# Patient Record
Sex: Male | Born: 1940 | Race: White | Hispanic: No | Marital: Married | Smoking: Former smoker
Health system: Southern US, Community
[De-identification: ages and names within clinical notes are randomized; demographics above are authoritative.]

## PROBLEM LIST (undated history)

## (undated) DIAGNOSIS — K5792 Diverticulitis of intestine, part unspecified, without perforation or abscess without bleeding: Secondary | ICD-10-CM

## (undated) DIAGNOSIS — I6529 Occlusion and stenosis of unspecified carotid artery: Secondary | ICD-10-CM

## (undated) DIAGNOSIS — R0600 Dyspnea, unspecified: Secondary | ICD-10-CM

## (undated) DIAGNOSIS — J189 Pneumonia, unspecified organism: Secondary | ICD-10-CM

## (undated) DIAGNOSIS — J449 Chronic obstructive pulmonary disease, unspecified: Secondary | ICD-10-CM

## (undated) DIAGNOSIS — I739 Peripheral vascular disease, unspecified: Secondary | ICD-10-CM

## (undated) DIAGNOSIS — J40 Bronchitis, not specified as acute or chronic: Secondary | ICD-10-CM

## (undated) DIAGNOSIS — K649 Unspecified hemorrhoids: Secondary | ICD-10-CM

## (undated) DIAGNOSIS — I2510 Atherosclerotic heart disease of native coronary artery without angina pectoris: Secondary | ICD-10-CM

## (undated) DIAGNOSIS — K429 Umbilical hernia without obstruction or gangrene: Secondary | ICD-10-CM

## (undated) DIAGNOSIS — M1990 Unspecified osteoarthritis, unspecified site: Secondary | ICD-10-CM

## (undated) DIAGNOSIS — R000 Tachycardia, unspecified: Secondary | ICD-10-CM

## (undated) DIAGNOSIS — R778 Other specified abnormalities of plasma proteins: Secondary | ICD-10-CM

## (undated) DIAGNOSIS — E538 Deficiency of other specified B group vitamins: Secondary | ICD-10-CM

## (undated) DIAGNOSIS — K922 Gastrointestinal hemorrhage, unspecified: Secondary | ICD-10-CM

## (undated) DIAGNOSIS — N179 Acute kidney failure, unspecified: Secondary | ICD-10-CM

## (undated) DIAGNOSIS — R7989 Other specified abnormal findings of blood chemistry: Secondary | ICD-10-CM

## (undated) DIAGNOSIS — I208 Other forms of angina pectoris: Secondary | ICD-10-CM

## (undated) DIAGNOSIS — C801 Malignant (primary) neoplasm, unspecified: Secondary | ICD-10-CM

## (undated) DIAGNOSIS — A419 Sepsis, unspecified organism: Secondary | ICD-10-CM

## (undated) DIAGNOSIS — N183 Chronic kidney disease, stage 3 unspecified: Secondary | ICD-10-CM

## (undated) DIAGNOSIS — I82442 Acute embolism and thrombosis of left tibial vein: Secondary | ICD-10-CM

## (undated) DIAGNOSIS — I10 Essential (primary) hypertension: Secondary | ICD-10-CM

## (undated) DIAGNOSIS — I471 Supraventricular tachycardia: Secondary | ICD-10-CM

## (undated) DIAGNOSIS — E785 Hyperlipidemia, unspecified: Secondary | ICD-10-CM

## (undated) HISTORY — PX: EYE SURGERY: SHX253

---

## 2009-03-14 ENCOUNTER — Ambulatory Visit: Payer: Self-pay

## 2009-03-26 ENCOUNTER — Ambulatory Visit: Payer: Self-pay | Admitting: Ophthalmology

## 2009-07-30 ENCOUNTER — Ambulatory Visit: Payer: Self-pay | Admitting: Ophthalmology

## 2011-02-26 ENCOUNTER — Ambulatory Visit: Payer: Self-pay | Admitting: Family Medicine

## 2011-06-12 ENCOUNTER — Ambulatory Visit: Payer: Self-pay | Admitting: Unknown Physician Specialty

## 2011-06-16 LAB — PATHOLOGY REPORT

## 2013-12-22 HISTORY — PX: COLONOSCOPY: SHX174

## 2014-12-22 DIAGNOSIS — J189 Pneumonia, unspecified organism: Secondary | ICD-10-CM

## 2014-12-22 HISTORY — DX: Pneumonia, unspecified organism: J18.9

## 2016-02-28 DIAGNOSIS — Z125 Encounter for screening for malignant neoplasm of prostate: Secondary | ICD-10-CM | POA: Diagnosis not present

## 2016-02-28 DIAGNOSIS — E538 Deficiency of other specified B group vitamins: Secondary | ICD-10-CM | POA: Diagnosis not present

## 2016-02-28 DIAGNOSIS — J40 Bronchitis, not specified as acute or chronic: Secondary | ICD-10-CM | POA: Diagnosis not present

## 2016-03-18 DIAGNOSIS — L570 Actinic keratosis: Secondary | ICD-10-CM | POA: Diagnosis not present

## 2016-03-18 DIAGNOSIS — Z0000 Encounter for general adult medical examination without abnormal findings: Secondary | ICD-10-CM | POA: Diagnosis not present

## 2016-03-18 DIAGNOSIS — E538 Deficiency of other specified B group vitamins: Secondary | ICD-10-CM | POA: Diagnosis not present

## 2016-05-16 DIAGNOSIS — E538 Deficiency of other specified B group vitamins: Secondary | ICD-10-CM | POA: Diagnosis not present

## 2016-10-07 DIAGNOSIS — M5430 Sciatica, unspecified side: Secondary | ICD-10-CM | POA: Diagnosis not present

## 2016-10-29 DIAGNOSIS — M5116 Intervertebral disc disorders with radiculopathy, lumbar region: Secondary | ICD-10-CM | POA: Diagnosis not present

## 2016-10-29 DIAGNOSIS — M5430 Sciatica, unspecified side: Secondary | ICD-10-CM | POA: Diagnosis not present

## 2016-11-06 ENCOUNTER — Other Ambulatory Visit: Payer: Self-pay | Admitting: Internal Medicine

## 2016-11-06 DIAGNOSIS — J449 Chronic obstructive pulmonary disease, unspecified: Secondary | ICD-10-CM | POA: Diagnosis not present

## 2016-11-06 DIAGNOSIS — M5116 Intervertebral disc disorders with radiculopathy, lumbar region: Secondary | ICD-10-CM

## 2016-11-19 ENCOUNTER — Ambulatory Visit
Admission: RE | Admit: 2016-11-19 | Discharge: 2016-11-19 | Disposition: A | Discharge status: Home / Self Care | Payer: PPO | Source: Ambulatory Visit | Attending: Internal Medicine | Admitting: Internal Medicine

## 2016-11-19 DIAGNOSIS — R937 Abnormal findings on diagnostic imaging of other parts of musculoskeletal system: Secondary | ICD-10-CM | POA: Diagnosis not present

## 2016-11-19 DIAGNOSIS — M5126 Other intervertebral disc displacement, lumbar region: Secondary | ICD-10-CM | POA: Diagnosis not present

## 2016-11-19 DIAGNOSIS — M5116 Intervertebral disc disorders with radiculopathy, lumbar region: Secondary | ICD-10-CM | POA: Diagnosis not present

## 2017-01-13 ENCOUNTER — Inpatient Hospital Stay
Admission: EM | Admit: 2017-01-13 | Discharge: 2017-01-14 | DRG: 190 | Disposition: A | Discharge status: Home / Self Care | Payer: PPO | Attending: Internal Medicine | Admitting: Internal Medicine

## 2017-01-13 ENCOUNTER — Encounter: Payer: Self-pay | Admitting: Emergency Medicine

## 2017-01-13 ENCOUNTER — Emergency Department: Payer: PPO

## 2017-01-13 DIAGNOSIS — J189 Pneumonia, unspecified organism: Secondary | ICD-10-CM | POA: Diagnosis not present

## 2017-01-13 DIAGNOSIS — R000 Tachycardia, unspecified: Secondary | ICD-10-CM | POA: Diagnosis not present

## 2017-01-13 DIAGNOSIS — F17210 Nicotine dependence, cigarettes, uncomplicated: Secondary | ICD-10-CM | POA: Diagnosis not present

## 2017-01-13 DIAGNOSIS — F17200 Nicotine dependence, unspecified, uncomplicated: Secondary | ICD-10-CM | POA: Diagnosis not present

## 2017-01-13 DIAGNOSIS — R05 Cough: Secondary | ICD-10-CM | POA: Diagnosis not present

## 2017-01-13 DIAGNOSIS — J441 Chronic obstructive pulmonary disease with (acute) exacerbation: Secondary | ICD-10-CM | POA: Diagnosis not present

## 2017-01-13 DIAGNOSIS — J440 Chronic obstructive pulmonary disease with acute lower respiratory infection: Secondary | ICD-10-CM | POA: Diagnosis not present

## 2017-01-13 DIAGNOSIS — R0602 Shortness of breath: Secondary | ICD-10-CM | POA: Diagnosis not present

## 2017-01-13 DIAGNOSIS — J9601 Acute respiratory failure with hypoxia: Secondary | ICD-10-CM | POA: Diagnosis not present

## 2017-01-13 HISTORY — DX: Bronchitis, not specified as acute or chronic: J40

## 2017-01-13 LAB — CBC
HCT: 43.2 % (ref 40.0–52.0)
Hemoglobin: 15.3 g/dL (ref 13.0–18.0)
MCH: 31.7 pg (ref 26.0–34.0)
MCHC: 35.4 g/dL (ref 32.0–36.0)
MCV: 89.5 fL (ref 80.0–100.0)
PLATELETS: 276 10*3/uL (ref 150–440)
RBC: 4.83 MIL/uL (ref 4.40–5.90)
RDW: 13.5 % (ref 11.5–14.5)
WBC: 13.5 10*3/uL — ABNORMAL HIGH (ref 3.8–10.6)

## 2017-01-13 LAB — TROPONIN I: TROPONIN I: 0.03 ng/mL — AB (ref <0.03)

## 2017-01-13 LAB — BASIC METABOLIC PANEL
ANION GAP: 10 (ref 5–15)
BUN: 17 mg/dL (ref 6–20)
CALCIUM: 9 mg/dL (ref 8.9–10.3)
CO2: 26 mmol/L (ref 22–32)
CREATININE: 1.03 mg/dL (ref 0.61–1.24)
Chloride: 102 mmol/L (ref 101–111)
Glucose, Bld: 98 mg/dL (ref 65–99)
Potassium: 4.6 mmol/L (ref 3.5–5.1)
Sodium: 138 mmol/L (ref 135–145)

## 2017-01-13 LAB — INFLUENZA PANEL BY PCR (TYPE A & B)
INFLAPCR: NEGATIVE
Influenza B By PCR: NEGATIVE

## 2017-01-13 MED ORDER — CEFTRIAXONE SODIUM-DEXTROSE 1-3.74 GM-% IV SOLR
1.0000 g | Freq: Once | INTRAVENOUS | Status: AC
  Administered 2017-01-13: 1 g via INTRAVENOUS
  Filled 2017-01-13: qty 50

## 2017-01-13 MED ORDER — DEXTROSE 5 % IV SOLN: 1.0000 g | INTRAVENOUS | Status: DC

## 2017-01-13 MED ORDER — DOCUSATE SODIUM 100 MG PO CAPS
100.0000 mg | ORAL_CAPSULE | Freq: Two times a day (BID) | ORAL | Status: DC
  Administered 2017-01-13 – 2017-01-14 (×2): 100 mg via ORAL
  Filled 2017-01-13 (×2): qty 1

## 2017-01-13 MED ORDER — CEFTRIAXONE SODIUM-DEXTROSE 1-3.74 GM-% IV SOLR
1.0000 g | INTRAVENOUS | Status: DC
  Filled 2017-01-13: qty 50

## 2017-01-13 MED ORDER — METHYLPREDNISOLONE SODIUM SUCC 125 MG IJ SOLR
125.0000 mg | Freq: Once | INTRAMUSCULAR | Status: AC
  Administered 2017-01-13: 125 mg via INTRAVENOUS
  Filled 2017-01-13: qty 2

## 2017-01-13 MED ORDER — ACETAMINOPHEN 325 MG PO TABS: 650.0000 mg | ORAL_TABLET | Freq: Four times a day (QID) | ORAL | Status: DC | PRN

## 2017-01-13 MED ORDER — DEXTROSE 5 % IV SOLN
500.0000 mg | Freq: Once | INTRAVENOUS | Status: AC
  Administered 2017-01-13: 500 mg via INTRAVENOUS
  Filled 2017-01-13: qty 500

## 2017-01-13 MED ORDER — METHYLPREDNISOLONE SODIUM SUCC 125 MG IJ SOLR: 60.0000 mg | INTRAMUSCULAR | Status: DC

## 2017-01-13 MED ORDER — ONDANSETRON HCL 4 MG/2ML IJ SOLN: 4.0000 mg | Freq: Four times a day (QID) | INTRAMUSCULAR | Status: DC | PRN

## 2017-01-13 MED ORDER — TRAZODONE HCL 50 MG PO TABS: 25.0000 mg | ORAL_TABLET | Freq: Every evening | ORAL | Status: DC | PRN

## 2017-01-13 MED ORDER — IPRATROPIUM-ALBUTEROL 0.5-2.5 (3) MG/3ML IN SOLN
3.0000 mL | Freq: Once | RESPIRATORY_TRACT | Status: AC
  Administered 2017-01-13: 3 mL via RESPIRATORY_TRACT
  Filled 2017-01-13: qty 3

## 2017-01-13 MED ORDER — ACETAMINOPHEN 650 MG RE SUPP: 650.0000 mg | Freq: Four times a day (QID) | RECTAL | Status: DC | PRN

## 2017-01-13 MED ORDER — DEXTROSE 5 % IV SOLN
500.0000 mg | INTRAVENOUS | Status: DC
  Filled 2017-01-13: qty 500

## 2017-01-13 MED ORDER — SODIUM CHLORIDE 0.9 % IV BOLUS (SEPSIS)
500.0000 mL | INTRAVENOUS | Status: AC
  Administered 2017-01-13: 500 mL via INTRAVENOUS

## 2017-01-13 MED ORDER — DEXTROSE 5 % IV SOLN: 500.0000 mg | INTRAVENOUS | Status: DC

## 2017-01-13 MED ORDER — IPRATROPIUM-ALBUTEROL 0.5-2.5 (3) MG/3ML IN SOLN
3.0000 mL | RESPIRATORY_TRACT | Status: DC
  Administered 2017-01-13 – 2017-01-14 (×5): 3 mL via RESPIRATORY_TRACT
  Filled 2017-01-13 (×5): qty 3

## 2017-01-13 MED ORDER — BISACODYL 5 MG PO TBEC: 5.0000 mg | DELAYED_RELEASE_TABLET | Freq: Every day | ORAL | Status: DC | PRN

## 2017-01-13 MED ORDER — ENOXAPARIN SODIUM 40 MG/0.4ML ~~LOC~~ SOLN
40.0000 mg | SUBCUTANEOUS | Status: DC
  Administered 2017-01-13: 40 mg via SUBCUTANEOUS
  Filled 2017-01-13: qty 0.4

## 2017-01-13 MED ORDER — ONDANSETRON HCL 4 MG PO TABS: 4.0000 mg | ORAL_TABLET | Freq: Four times a day (QID) | ORAL | Status: DC | PRN

## 2017-01-13 NOTE — ED Notes (Signed)
Dr. Archie Balboa aware of lab values and x-ray results.

## 2017-01-13 NOTE — ED Triage Notes (Signed)
Says cough for about a week and felt like bronchitis.  Now is sob.  Frequent cough productive.

## 2017-01-13 NOTE — ED Notes (Signed)
Pt ambulated about 40 foot and started out at 96% o2 then when ambulated a little more pt dropped down to 84-86% o2. Advised MD of this and discussed options with pt; pt at rest is 96% o2

## 2017-01-13 NOTE — H&P (Signed)
[place] [facility] [place] NAME: Anthony Morrison    MR#:  [id number]  DATE OF BIRTH:  [birth year]  DATE OF ADMISSION:  01/13/2017  PRIMARY CARE PHYSICIAN: Rusty Aus, MD   REQUESTING/REFERRING PHYSICIAN: Dr. Karma Greaser  CHIEF COMPLAINT: Shortness of breath, cough    Chief Complaint  Patient presents with  . Shortness of Breath    HISTORY OF PRESENT ILLNESS:  Anthony Morrison  is a 76 y.o. male with a known history of No medical problems comes in because of shortness of breath, cough getting progressively worse for the past 3 weeks. Patient went to primary doctor Dr. Emily Filbert yesterday and was given antibiotics. Patient does not have any inhalers at home. Today he came to emergency room because of worsening shortness of breath, cough and green phlegm. Patient oxygen saturation dropped to 86% on 2 L of oxygen on ambulation. At rest saturation is 96% on 2 L of oxygen. Heavy smoker smokes about 50-pack-years. Does not have a diagnosis of COPD at. Chest x-ray showed pneumonia.patient found to have bilateral expiratory wheeze in all lung fields.  PAST MEDICAL HISTORY:   Past Medical History:  Diagnosis Date  . Bronchitis     PAST SURGICAL HISTOIRY:  History reviewed. No pertinent surgical history.  SOCIAL HISTORY:   Social History  Substance Use Topics  . Smoking status: Current Every Day Smoker  . Smokeless tobacco: Never Used  . Alcohol use Yes    FAMILY HISTORY:  No family history on file.  DRUG ALLERGIES:   Allergies  Allergen Reactions  . Sulfa Antibiotics Other (See Comments)    Does not know    REVIEW OF SYSTEMS:  CONSTITUTIONAL: No fever, fatigue or weakness.  EYES: No blurred or double vision.  EARS, NOSE, AND THROAT: No tinnitus or ear pain.  RESPIRATORY: Cough, shortness of breath, wheezing CARDIOVASCULAR: No chest pain, orthopnea, edema.  GASTROINTESTINAL: No nausea, vomiting, diarrhea or abdominal pain.   GENITOURINARY: No dysuria, hematuria.  ENDOCRINE: No polyuria, nocturia,  HEMATOLOGY: No anemia, easy bruising or bleeding SKIN: No rash or lesion. MUSCULOSKELETAL: No joint pain or arthritis.   NEUROLOGIC: No tingling, numbness, weakness.  PSYCHIATRY: No anxiety or depression.   MEDICATIONS AT HOME:   Prior to Admission medications   Medication Sig Start Date End Date Taking? Authorizing Provider  levofloxacin (LEVAQUIN) 500 MG tablet Take 1 tablet by mouth daily. 01/12/17 01/18/17 Yes Historical Provider, MD  vitamin B-12 (CYANOCOBALAMIN) 1000 MCG tablet Take 1,000 mcg by mouth daily.   Yes Historical Provider, MD      VITAL SIGNS:  Blood pressure 128/61, pulse 96, temperature 98.1 F (36.7 C), temperature source Oral, resp. rate 20, height '5\' 7"'$  (1.702 m), weight 68 kg (150 lb), SpO2 95 %.  PHYSICAL EXAMINATION:  GENERAL:  76 y.o.-year-old patient lying in the bed with no acute distress.  EYES: Pupils equal, round, reactive to light and accommodation. No scleral icterus. Extraocular muscles intact.  HEENT: Head atraumatic, normocephalic. Oropharynx and nasopharynx clear.  NECK:  Supple, no jugular venous distention. No thyroid enlargement, no tenderness.  LUNGS:  Expiratory wheeze in all lung fields, not using accessory muscles of respiration.  CARDIOVASCULAR: S1, S2 normal. No murmurs, rubs, or gallops.  ABDOMEN: Soft, nontender, nondistended. Bowel sounds present. No organomegaly or mass.  EXTREMITIES: No pedal edema, cyanosis, or clubbing.  NEUROLOGIC: Cranial nerves II through XII are intact. Muscle strength 5/5 in all extremities. Sensation intact. Gait not checked.  PSYCHIATRIC:  The patient is alert and oriented x 3.  SKIN: No obvious rash, lesion, or ulcer.   LABORATORY PANEL:   CBC  Recent Labs Lab 01/13/17 1135  WBC 13.5*  HGB 15.3  HCT 43.2  PLT 276    ------------------------------------------------------------------------------------------------------------------  Chemistries   Recent Labs Lab 01/13/17 1135  NA 138  K 4.6  CL 102  CO2 26  GLUCOSE 98  BUN 17  CREATININE 1.03  CALCIUM 9.0   ------------------------------------------------------------------------------------------------------------------  Cardiac Enzymes  Recent Labs Lab 01/13/17 1135  TROPONINI 0.03*   ------------------------------------------------------------------------------------------------------------------  RADIOLOGY:  Dg Chest 2 View  Result Date: 01/13/2017 CLINICAL DATA:  Cough. EXAM: CHEST  2 VIEW COMPARISON:  No recent prior. FINDINGS: Mediastinum is normal. Left mid lung field infiltrate consistent pneumonia noted. No pleural effusion or pneumothorax. COPD. Heart size normal. Degenerative changes thoracic spine . IMPRESSION: Left mid lung field infiltrate consistent with pneumonia.  COPD. Electronically Signed   By: Marcello Moores  Register   On: 01/13/2017 12:32    EKG:   Orders placed or performed during the hospital encounter of 01/13/17  . EKG 12-Lead  . EKG 12-Lead   sinustachycardia with 1 10 bpm no ST-T changes.  IMPRESSION AND PLAN:   Acute respiratory failure with hypoxia secondary to community-acquired pneumonia, COPD exacerbation: Admitted to hospitalist service, started on IV Rocephin, Zithromax, nebulizers, IV steroids, follow blood cultures, sputum cultures. #2 COPD exacerbation: Patient is a heavy smoker and has wheezing, hypoxia and ambulation: Continue nebulizers, steroids for 24 hours and recheck the oxygen saturation on ambulation to see if he needs oxygen at home. Told him that COPD is going to be in his diagnosis, advised the patient to quit, offered nicotine patch, counselled against  smoking for about 10 minutes, patient says that he is willing to try. #3 sinus tachycardia secondary to respiratory distress: Watch  closely. He does not improve start the patient on Cardizem, check echocardiogram. Monitor on  Off unit . Telemetry.   All the records are reviewed and case discussed with ED provider. Management plans discussed with the patient, family and they are in agreement.  CODE STATUS: full TOTAL TIME TAKING CARE OF THIS PATIENT: 55 minutes.    Epifanio Lesches M.D on 01/13/2017 at 7:48 PM  Between 7am to 6pm - Pager - [phone]  After 6pm go to [email or web address] - password [email or web address] [facility]  Office  [phone]  CC: Primary care physician; Rusty Aus, MD  Note: This dictation was prepared with Dragon dictation along with smaller phrase technology. Any transcriptional errors that result from this process are unintentional.

## 2017-01-13 NOTE — ED Notes (Signed)
This tech transporting pt to room 203 at this time.

## 2017-01-13 NOTE — ED Provider Notes (Signed)
[facility] Emergency Department Provider Note  ____________________________________________   First MD Initiated Contact with Patient 01/13/17 1550     (approximate)  I have reviewed the triage vital signs and the nursing notes.   HISTORY  Chief Complaint Shortness of Breath    HPI Anthony Morrison is a 76 y.o. male who presents for evaluation of several days to a week of gradual onset productive cough and shortness of breath.  Exertion makes his symptoms worse.  He does not have a formal diagnosis of COPD but it is probable given that he is a current every day smoker and has been for decades.  He has noted some increased wheezing.  He has been treated in the past for bronchitis and thought that is what was going on but over the last few days he has also had chills and worsening shortness of breath compared to usual.  Nothing makes him feel better except for ibuprofen which makes his chills go away.  He has not had a measured fever.  He denies chest pain, nausea, vomiting, abdominal pain, dysuria.  He called his primary care doctor, Dr. Sabra Heck, yesterday, who called him in a prescription for Levaquin 500 mg daily 10 days.  He has only taken one of them so far.  However because he was feeling worse today he felt like he should be evaluated.  He had a particularly bad night last night and had a set up most of the night due to the shortness of breath.  He is generally very healthy and takes no daily medications and is quite active.  Past Medical History:  Diagnosis Date  . Bronchitis     Patient Active Problem List   Diagnosis Date Noted  . Acute respiratory failure with hypoxia ([place]) 01/13/2017    History reviewed. No pertinent surgical history.  Prior to Admission medications   Medication Sig Start Date End Date Taking? Authorizing Provider  levofloxacin (LEVAQUIN) 500 MG tablet Take 1 tablet by mouth daily. 01/12/17 01/18/17 Yes Historical Provider, MD    vitamin B-12 (CYANOCOBALAMIN) 1000 MCG tablet Take 1,000 mcg by mouth daily.   Yes Historical Provider, MD    Allergies Sulfa antibiotics  No family history on file.  Social History Social History  Substance Use Topics  . Smoking status: Current Every Day Smoker  . Smokeless tobacco: Never Used  . Alcohol use Yes    Review of Systems Constitutional: No fever/chills Eyes: No visual changes. ENT: No sore throat. Cardiovascular: Denies chest pain. Respiratory: Denies shortness of breath. Gastrointestinal: No abdominal pain.  No nausea, no vomiting.  No diarrhea.  No constipation. Genitourinary: Negative for dysuria. Musculoskeletal: Negative for back pain. Skin: Negative for rash. Neurological: Negative for headaches, focal weakness or numbness.  10-point ROS otherwise negative.  ____________________________________________   PHYSICAL EXAM:  VITAL SIGNS: ED Triage Vitals  Enc Vitals Group     BP 01/13/17 1129 (!) 162/96     Pulse Rate 01/13/17 1129 68     Resp 01/13/17 1129 16     Temp 01/13/17 1129 98.1 F (36.7 C)     Temp Source 01/13/17 1129 Oral     SpO2 01/13/17 1129 94 %     Weight 01/13/17 1130 150 lb (68 kg)     Height 01/13/17 1130 '5\' 7"'$  (1.702 m)     Head Circumference --      Peak Flow --      Pain Score --  Pain Loc --      Pain Edu? --      Excl. in [place]? --     Constitutional: Alert and oriented. No acute distress. Eyes: Conjunctivae are normal. PERRL. EOMI. Head: Atraumatic. Nose: No congestion/rhinnorhea. Mouth/Throat: Mucous membranes are moist.  Oropharynx non-erythematous. Neck: No stridor.  No meningeal signs.   Cardiovascular: Normal rate, regular rhythm. Good peripheral circulation. Grossly normal heart sounds. Respiratory: Normal respiratory effort.  No retractions. Expiratory wheezing throughout lung fields c/w COPD Gastrointestinal: Soft and nontender. No distention.  Musculoskeletal: No lower extremity tenderness nor edema.  No gross deformities of extremities. Neurologic:  Normal speech and language. No gross focal neurologic deficits are appreciated.  Skin:  Skin is warm, dry and intact. No rash noted. Psychiatric: Mood and affect are normal. Speech and behavior are normal.  ____________________________________________   LABS (all labs ordered are listed, but only abnormal results are displayed)  Labs Reviewed  CBC - Abnormal; Notable for the following:       Result Value   WBC 13.5 (*)    All other components within normal limits  TROPONIN I - Abnormal; Notable for the following:    Troponin I 0.03 (*)    All other components within normal limits  CULTURE, BLOOD (ROUTINE X 2)  CULTURE, BLOOD (ROUTINE X 2)  BASIC METABOLIC PANEL  INFLUENZA PANEL BY PCR (TYPE A & B)  BASIC METABOLIC PANEL  CBC  CBC  CREATININE, SERUM   ____________________________________________  EKG  ED ECG REPORT I, Xavier Fournier, the attending physician, personally viewed and interpreted this ECG.  Date: 01/13/2017 EKG Time: 11:37 AM Rate: 118 Rhythm: Sinus tachycardia with frequent PVCs QRS Axis: normal Intervals: Incomplete right bundle branch block and left anterior fascicular block.  Probable LVH ST/T Wave abnormalities: Non-specific ST segment / T-wave changes, but no evidence of acute ischemia. Conduction Disturbances: none Narrative Interpretation: No STEMI  ____________________________________________  RADIOLOGY   Dg Chest 2 View  Result Date: 01/13/2017 CLINICAL DATA:  Cough. EXAM: CHEST  2 VIEW COMPARISON:  No recent prior. FINDINGS: Mediastinum is normal. Left mid lung field infiltrate consistent pneumonia noted. No pleural effusion or pneumothorax. COPD. Heart size normal. Degenerative changes thoracic spine . IMPRESSION: Left mid lung field infiltrate consistent with pneumonia.  COPD. Electronically Signed   By: Marcello Moores  Register   On: 01/13/2017 12:32     ____________________________________________   PROCEDURES  Procedure(s) performed:   Procedures   Critical Care performed: No ____________________________________________   INITIAL IMPRESSION / ASSESSMENT AND PLAN / ED COURSE  Pertinent labs & imaging results that were available during my care of the patient were reviewed by me and considered in my medical decision making (see chart for details).  The patient is well-appearing and in spite of the pneumonia seen on x-ray.  I had a long talk with the patient and his wife about the appropriate course of care.  Given that he has only had one dose of outpatient antibiotics I feel that we cannot say this is a failure of treatment.  He is on appropriate therapy, he is not hypoxemic, and he has reassuring vital signs.  I will check blood cultures and give him DuoNeb just to treat what I believe is likely also a COPD exacerbation in spite of his lack of formal diagnosis.  Also treat with a dose of ceftriaxone 1 g IV and azithromycin 500 mg IV but if he continues to feel appropriate for outpatient management and close follow-up with Dr. Sabra Heck  I think that would be appropriate.  I will reassess after his breathing treatments.  We will also make sure that he does not become hypoxemic with ambulation.   Clinical Course as of Jan 13 2109  Tue Jan 13, 2017  1740 Patient feels "a little" better after Duonebs.  O2 sats at rest are in the mid to upper 90s.  Tachy at around 100-110, getting 500 mL NS.  Will ambulate patient to determine if he desats.    [CF]  2458 The patient dropped down to 84% oxygen with ambulation and became short of breath within just a few steps.  I saw him when he was back in bed and although his oxygen level had increased to 95% he was still tachypneic in the mid 20s to 30.  Given his oxygen requirement with ambulation and has persistent difficulty breathing I will admit him to the hospitalist for further management of his  community-acquired pneumonia and acute respiratory failure with hypoxemia.  [CF]  0998 Patient  [CF]    Clinical Course User Index [CF] Hinda Kehr, MD    ____________________________________________  FINAL CLINICAL IMPRESSION(S) / ED DIAGNOSES  Final diagnoses:  Community acquired pneumonia of left lung, unspecified part of lung  COPD exacerbation ([place])  Acute respiratory failure with hypoxemia ([place])     MEDICATIONS GIVEN DURING THIS VISIT:  Medications  acetaminophen (TYLENOL) tablet 650 mg (not administered)    Or  acetaminophen (TYLENOL) suppository 650 mg (not administered)  traZODone (DESYREL) tablet 25 mg (not administered)  docusate sodium (COLACE) capsule 100 mg (100 mg Oral Given 01/13/17 2055)  bisacodyl (DULCOLAX) EC tablet 5 mg (not administered)  ondansetron (ZOFRAN) tablet 4 mg (not administered)    Or  ondansetron (ZOFRAN) injection 4 mg (not administered)  enoxaparin (LOVENOX) injection 40 mg (40 mg Subcutaneous Given 01/13/17 2056)  methylPREDNISolone sodium succinate (SOLU-MEDROL) 125 mg/2 mL injection 60 mg (not administered)  ipratropium-albuterol (DUONEB) 0.5-2.5 (3) MG/3ML nebulizer solution 3 mL (3 mLs Nebulization Given 01/13/17 2043)  cefTRIAXone (ROCEPHIN) IVPB 1 g (not administered)  azithromycin (ZITHROMAX) 500 mg in dextrose 5 % 250 mL IVPB (not administered)  azithromycin (ZITHROMAX) 500 mg in dextrose 5 % 250 mL IVPB (0 mg Intravenous Stopped 01/13/17 1719)  ipratropium-albuterol (DUONEB) 0.5-2.5 (3) MG/3ML nebulizer solution 3 mL (3 mLs Nebulization Given 01/13/17 1619)  ipratropium-albuterol (DUONEB) 0.5-2.5 (3) MG/3ML nebulizer solution 3 mL (3 mLs Nebulization Given 01/13/17 1619)  ipratropium-albuterol (DUONEB) 0.5-2.5 (3) MG/3ML nebulizer solution 3 mL (3 mLs Nebulization Given 01/13/17 1619)  cefTRIAXone (ROCEPHIN) IVPB 1 g (1 g Intravenous Given 01/13/17 1825)  sodium chloride 0.9 % bolus 500 mL (0 mLs Intravenous Stopped 01/13/17 1826)   methylPREDNISolone sodium succinate (SOLU-MEDROL) 125 mg/2 mL injection 125 mg (125 mg Intravenous Given 01/13/17 1825)  sodium chloride 0.9 % bolus 500 mL (500 mLs Intravenous New Bag/Given 01/13/17 1825)     NEW OUTPATIENT MEDICATIONS STARTED DURING THIS VISIT:  Current Discharge Medication List      Current Discharge Medication List      Current Discharge Medication List       Note:  This document was prepared using Dragon voice recognition software and may include unintentional dictation errors.    Hinda Kehr, MD 01/13/17 2110

## 2017-01-14 LAB — BASIC METABOLIC PANEL
ANION GAP: 10 (ref 5–15)
BUN: 15 mg/dL (ref 6–20)
CHLORIDE: 104 mmol/L (ref 101–111)
CO2: 24 mmol/L (ref 22–32)
Calcium: 8.5 mg/dL — ABNORMAL LOW (ref 8.9–10.3)
Creatinine, Ser: 1.09 mg/dL (ref 0.61–1.24)
GFR calc Af Amer: >60 mL/min (ref >60)
GLUCOSE: 192 mg/dL — AB (ref 65–99)
POTASSIUM: 3.6 mmol/L (ref 3.5–5.1)
SODIUM: 138 mmol/L (ref 135–145)

## 2017-01-14 LAB — CBC
HEMATOCRIT: 37.6 % — AB (ref 40.0–52.0)
HEMOGLOBIN: 13.2 g/dL (ref 13.0–18.0)
MCH: 31.6 pg (ref 26.0–34.0)
MCHC: 35.1 g/dL (ref 32.0–36.0)
MCV: 90.1 fL (ref 80.0–100.0)
Platelets: 249 10*3/uL (ref 150–440)
RBC: 4.17 MIL/uL — ABNORMAL LOW (ref 4.40–5.90)
RDW: 13.7 % (ref 11.5–14.5)
WBC: 6.2 10*3/uL (ref 3.8–10.6)

## 2017-01-14 LAB — GLUCOSE, CAPILLARY: Glucose-Capillary: 160 mg/dL — ABNORMAL HIGH (ref 65–99)

## 2017-01-14 MED ORDER — ALBUTEROL SULFATE HFA 108 (90 BASE) MCG/ACT IN AERS: 1.0000 | INHALATION_SPRAY | Freq: Four times a day (QID) | RESPIRATORY_TRACT | 1 refills | Status: DC | PRN

## 2017-01-14 MED ORDER — LEVOFLOXACIN 500 MG PO TABS
500.0000 mg | ORAL_TABLET | Freq: Every day | ORAL | Status: DC
  Administered 2017-01-14: 500 mg via ORAL
  Filled 2017-01-14: qty 1

## 2017-01-14 MED ORDER — FLUTICASONE-SALMETEROL 250-50 MCG/DOSE IN AEPB: 1.0000 | INHALATION_SPRAY | Freq: Two times a day (BID) | RESPIRATORY_TRACT | 1 refills | Status: DC

## 2017-01-14 MED ORDER — PREDNISONE 50 MG PO TABS: 50.0000 mg | ORAL_TABLET | Freq: Every day | ORAL | Status: DC

## 2017-01-14 MED ORDER — ALBUTEROL SULFATE HFA 108 (90 BASE) MCG/ACT IN AERS: 1.0000 | INHALATION_SPRAY | Freq: Four times a day (QID) | RESPIRATORY_TRACT | Status: DC | PRN

## 2017-01-14 MED ORDER — MOMETASONE FURO-FORMOTEROL FUM 100-5 MCG/ACT IN AERO
2.0000 | INHALATION_SPRAY | Freq: Two times a day (BID) | RESPIRATORY_TRACT | Status: DC
  Filled 2017-01-14: qty 8.8

## 2017-01-14 MED ORDER — PREDNISONE 10 MG PO TABS: ORAL_TABLET | ORAL | 0 refills | Status: DC

## 2017-01-14 MED ORDER — ALBUTEROL SULFATE (2.5 MG/3ML) 0.083% IN NEBU: 2.5000 mg | INHALATION_SOLUTION | Freq: Four times a day (QID) | RESPIRATORY_TRACT | Status: DC | PRN

## 2017-01-14 NOTE — Discharge Summary (Signed)
[facility] NAME: Anthony Morrison    MR#:  [id number]  DATE OF BIRTH:  11/18/1941  DATE OF ADMISSION:  01/13/2017 ADMITTING PHYSICIAN: Epifanio Lesches, MD  DATE OF DISCHARGE:01/14/17  PRIMARY CARE PHYSICIAN: Rusty Aus, MD    ADMISSION DIAGNOSIS:  COPD exacerbation ([place]) [J44.1] Acute respiratory failure with hypoxemia ([place]) [J96.01] Community acquired pneumonia of left lung, unspecified part of lung [J18.9]  DISCHARGE DIAGNOSIS:  Left LL pneumonia-CAP Acute hypoxic respiratory failure due to COPD exacerbation-new Tobacco abuse Acute hypoxia-resolved SECONDARY DIAGNOSIS:   Past Medical History:  Diagnosis Date  . Bronchitis     HOSPITAL COURSE:  Anthony Morrison  is a 76 y.o. male with a known history of No medical problems comes in because of shortness of breath, cough getting progressively worse for the past 3 weeks. Patient went to primary doctor Dr. Emily Filbert yesterday and was given antibiotics. Patient does not have any inhalers at home. Today he came to emergency room because of worsening shortness of breath, cough and green phlegm. Patient oxygen saturation dropped to 86% on 2 L of oxygen on ambulation  1.Acute respiratory failure with hypoxia secondary to community-acquired pneumonia, COPD exacerbation: - was on IV Rocephin, Zithromax, nebulizers, IV steroids, follow blood cultures, sputum cultures. -change to oral levaquin (pt has them thru PCP) -oral steroid taper -proventil and advair  #2 COPD exacerbation: Patient is a heavy smoker and has wheezing, hypoxia and ambulation:  -advised smoking cessation > 4 mins spent -sats after ambulation 94% Po oral steroid and inhalers  #3 sinus tachycardia secondary to respiratory distress  Overall improving D/c home  CONSULTS OBTAINED:    DRUG ALLERGIES:   Allergies  Allergen Reactions  . Sulfa Antibiotics Other (See Comments)    Does not know     DISCHARGE MEDICATIONS:   Current Discharge Medication List    START taking these medications   Details  albuterol (PROVENTIL HFA;VENTOLIN HFA) 108 (90 Base) MCG/ACT inhaler Inhale 1 puff into the lungs every 6 (six) hours as needed for wheezing or shortness of breath. Qty: 1 Inhaler, Refills: 1    Fluticasone-Salmeterol (ADVAIR DISKUS) 250-50 MCG/DOSE AEPB Inhale 1 puff into the lungs 2 times daily at 12 noon and 4 pm. Qty: 1 each, Refills: 1    predniSONE (DELTASONE) 10 MG tablet Take 50 mg daily then taper by 10 mg daily and stop Qty: 15 tablet, Refills: 0      CONTINUE these medications which have NOT CHANGED   Details  levofloxacin (LEVAQUIN) 500 MG tablet Take 1 tablet by mouth daily.    vitamin B-12 (CYANOCOBALAMIN) 1000 MCG tablet Take 1,000 mcg by mouth daily.        If you experience worsening of your admission symptoms, develop shortness of breath, life threatening emergency, suicidal or homicidal thoughts you must seek medical attention immediately by calling 911 or calling your MD immediately  if symptoms less severe.  You Must read complete instructions/literature along with all the possible adverse reactions/side effects for all the Medicines you take and that have been prescribed to you. Take any new Medicines after you have completely understood and accept all the possible adverse reactions/side effects.   Please note  You were cared for by a hospitalist during your hospital stay. If you have any questions about your discharge medications or the care you received while you were in the hospital after you are discharged, you can call the unit and asked to  speak with the hospitalist on call if the hospitalist that took care of you is not available. Once you are discharged, your primary care physician will handle any further medical issues. Please note that NO REFILLS for any discharge medications will be authorized once you are discharged, as it is imperative that  you return to your primary care physician (or establish a relationship with a primary care physician if you do not have one) for your aftercare needs so that they can reassess your need for medications and monitor your lab values. Today   SUBJECTIVE   Doing well  VITAL SIGNS:  Blood pressure 107/60, pulse (!) 101, temperature 97.7 F (36.5 C), temperature source Oral, resp. rate 16, height '5\' 7"'$  (1.702 m), weight 66.2 kg (146 lb), SpO2 93 %.  I/O:   Intake/Output Summary (Last 24 hours) at 01/14/17 1407 Last data filed at 01/14/17 1405  Gross per 24 hour  Intake              720 ml  Output              700 ml  Net               20 ml    PHYSICAL EXAMINATION:  GENERAL:  76 y.o.-year-old patient lying in the bed with no acute distress.  EYES: Pupils equal, round, reactive to light and accommodation. No scleral icterus. Extraocular muscles intact.  HEENT: Head atraumatic, normocephalic. Oropharynx and nasopharynx clear.  NECK:  Supple, no jugular venous distention. No thyroid enlargement, no tenderness.  LUNGS: Normal breath sounds bilaterally, no wheezing, rales,rhonchi or crepitation. No use of accessory muscles of respiration.  CARDIOVASCULAR: S1, S2 normal. No murmurs, rubs, or gallops.  ABDOMEN: Soft, non-tender, non-distended. Bowel sounds present. No organomegaly or mass.  EXTREMITIES: No pedal edema, cyanosis, or clubbing.  NEUROLOGIC: Cranial nerves II through XII are intact. Muscle strength 5/5 in all extremities. Sensation intact. Gait not checked.  PSYCHIATRIC: The patient is alert and oriented x 3.  SKIN: No obvious rash, lesion, or ulcer.   DATA REVIEW:   CBC   Recent Labs Lab 01/14/17 0514  WBC 6.2  HGB 13.2  HCT 37.6*  PLT 249    Chemistries   Recent Labs Lab 01/14/17 0514  NA 138  K 3.6  CL 104  CO2 24  GLUCOSE 192*  BUN 15  CREATININE 1.09  CALCIUM 8.5*    Microbiology Results   Recent Results (from the past 240 hour(s))  Blood culture  (routine x 2)     Status: None (Preliminary result)   Collection Time: 01/13/17  4:02 PM  Result Value Ref Range Status   Specimen Description BLOOD RIGHT AC  Final   Special Requests   Final    BOTTLES DRAWN AEROBIC AND ANAEROBIC AER 14ML ANA 12ML   Culture NO GROWTH < 24 HOURS  Final   Report Status PENDING  Incomplete  Blood culture (routine x 2)     Status: None (Preliminary result)   Collection Time: 01/13/17  4:10 PM  Result Value Ref Range Status   Specimen Description BLOOD LEFT AC  Final   Special Requests   Final    BOTTLES DRAWN AEROBIC AND ANAEROBIC AER 6ML ANA 3ML   Culture NO GROWTH < 24 HOURS  Final   Report Status PENDING  Incomplete    RADIOLOGY:  Dg Chest 2 View  Result Date: 01/13/2017 CLINICAL DATA:  Cough. EXAM: CHEST  2 VIEW COMPARISON:  No  recent prior. FINDINGS: Mediastinum is normal. Left mid lung field infiltrate consistent pneumonia noted. No pleural effusion or pneumothorax. COPD. Heart size normal. Degenerative changes thoracic spine . IMPRESSION: Left mid lung field infiltrate consistent with pneumonia.  COPD. Electronically Signed   By: Marcello Moores  Register   On: 01/13/2017 12:32     Management plans discussed with the patient, family and they are in agreement.  CODE STATUS:     Code Status Orders        Start     Ordered   01/13/17 1828  Full code  Continuous     01/13/17 1830    Code Status History    Date Active Date Inactive Code Status Order ID Comments User Context   This patient has a current code status but no historical code status.      TOTAL TIME TAKING CARE OF THIS PATIENT: 40 minutes.    Anthony Morrison M.D on 01/14/2017 at 2:07 PM  Between 7am to 6pm - Pager - [phone] After 6pm go to [email or web address] - password [email or web address] [facility]  Office  [phone]  CC: Primary care physician; Rusty Aus, MD

## 2017-01-14 NOTE — Progress Notes (Signed)
While rounding the unit the [facility] visit the Pt. Pt was with wife bedside. Pt and wife talked with [place] about their family, Pt's struggle with smoking, their grandchildren and desire to develop a health lifestyle. Pt request for prayer, which the [facility] provided. Pt was getting ready to be discharged.     01/14/17 1500  Clinical Encounter Type  Visited With Patient;Family  Visit Type Initial;Spiritual support  Referral From Nurse  Consult/Referral To Chaplain  Spiritual Encounters  Spiritual Needs Prayer

## 2017-01-14 NOTE — Discharge Instructions (Signed)
Stop smoking

## 2017-01-14 NOTE — Progress Notes (Signed)
01/14/2017 3:35 PM  Anthony Morrison to be D/C'd Home per MD order.  Discussed prescriptions and follow up appointments with the patient. Prescriptions given to patient, medication list explained in detail. Pt verbalized understanding.  Allergies as of 01/14/2017      Reactions   Sulfa Antibiotics Other (See Comments)   Does not know      Medication List    TAKE these medications   albuterol 108 (90 Base) MCG/ACT inhaler Commonly known as:  PROVENTIL HFA;VENTOLIN HFA Inhale 1 puff into the lungs every 6 (six) hours as needed for wheezing or shortness of breath.   Fluticasone-Salmeterol 250-50 MCG/DOSE Aepb Commonly known as:  ADVAIR DISKUS Inhale 1 puff into the lungs 2 times daily at 12 noon and 4 pm.   levofloxacin 500 MG tablet Commonly known as:  LEVAQUIN Take 1 tablet by mouth daily. Notes to patient:  Last received 3:30PM 01/14/17   predniSONE 10 MG tablet Commonly known as:  DELTASONE Take 50 mg daily then taper by 10 mg daily and stop Start taking on:  01/15/2017   vitamin B-12 1000 MCG tablet Commonly known as:  CYANOCOBALAMIN Take 1,000 mcg by mouth daily.       Vitals:   01/13/17 2151 01/14/17 0506  BP:  107/60  Pulse: (!) 109 (!) 101  Resp:    Temp:  97.7 F (36.5 C)    Skin clean, dry and intact without evidence of skin break down, no evidence of skin tears noted. IV catheter discontinued intact. Site without signs and symptoms of complications. Dressing and pressure applied. Pt denies pain at this time. No complaints noted.  An After Visit Summary was printed and given to the patient. Patient escorted via [place], and D/C home via private auto.  Dola Argyle

## 2017-01-18 LAB — CULTURE, BLOOD (ROUTINE X 2)
CULTURE: NO GROWTH
Culture: NO GROWTH

## 2017-01-20 DIAGNOSIS — J431 Panlobular emphysema: Secondary | ICD-10-CM | POA: Diagnosis not present

## 2017-01-20 DIAGNOSIS — J181 Lobar pneumonia, unspecified organism: Secondary | ICD-10-CM | POA: Diagnosis not present

## 2017-01-20 DIAGNOSIS — J189 Pneumonia, unspecified organism: Secondary | ICD-10-CM | POA: Diagnosis not present

## 2017-01-20 DIAGNOSIS — J9601 Acute respiratory failure with hypoxia: Secondary | ICD-10-CM | POA: Diagnosis not present

## 2017-02-03 DIAGNOSIS — J431 Panlobular emphysema: Secondary | ICD-10-CM | POA: Diagnosis not present

## 2017-03-12 DIAGNOSIS — Z0000 Encounter for general adult medical examination without abnormal findings: Secondary | ICD-10-CM | POA: Diagnosis not present

## 2017-03-12 DIAGNOSIS — E538 Deficiency of other specified B group vitamins: Secondary | ICD-10-CM | POA: Diagnosis not present

## 2017-03-24 DIAGNOSIS — J431 Panlobular emphysema: Secondary | ICD-10-CM | POA: Diagnosis not present

## 2017-03-24 DIAGNOSIS — Z0000 Encounter for general adult medical examination without abnormal findings: Secondary | ICD-10-CM | POA: Insufficient documentation

## 2017-03-24 DIAGNOSIS — E538 Deficiency of other specified B group vitamins: Secondary | ICD-10-CM | POA: Diagnosis not present

## 2017-04-14 DIAGNOSIS — J0100 Acute maxillary sinusitis, unspecified: Secondary | ICD-10-CM | POA: Diagnosis not present

## 2017-06-29 DIAGNOSIS — H353130 Nonexudative age-related macular degeneration, bilateral, stage unspecified: Secondary | ICD-10-CM | POA: Diagnosis not present

## 2017-09-24 DIAGNOSIS — E538 Deficiency of other specified B group vitamins: Secondary | ICD-10-CM | POA: Diagnosis not present

## 2017-09-24 DIAGNOSIS — Z125 Encounter for screening for malignant neoplasm of prostate: Secondary | ICD-10-CM | POA: Diagnosis not present

## 2017-09-24 DIAGNOSIS — J431 Panlobular emphysema: Secondary | ICD-10-CM | POA: Diagnosis not present

## 2017-09-24 DIAGNOSIS — Z79899 Other long term (current) drug therapy: Secondary | ICD-10-CM | POA: Diagnosis not present

## 2017-12-19 DIAGNOSIS — J209 Acute bronchitis, unspecified: Secondary | ICD-10-CM | POA: Diagnosis not present

## 2017-12-22 DIAGNOSIS — Z951 Presence of aortocoronary bypass graft: Secondary | ICD-10-CM

## 2017-12-22 HISTORY — DX: Presence of aortocoronary bypass graft: Z95.1

## 2017-12-28 DIAGNOSIS — H353131 Nonexudative age-related macular degeneration, bilateral, early dry stage: Secondary | ICD-10-CM | POA: Diagnosis not present

## 2018-03-19 DIAGNOSIS — Z125 Encounter for screening for malignant neoplasm of prostate: Secondary | ICD-10-CM | POA: Diagnosis not present

## 2018-03-19 DIAGNOSIS — E538 Deficiency of other specified B group vitamins: Secondary | ICD-10-CM | POA: Diagnosis not present

## 2018-03-19 DIAGNOSIS — Z79899 Other long term (current) drug therapy: Secondary | ICD-10-CM | POA: Diagnosis not present

## 2018-03-25 DIAGNOSIS — E538 Deficiency of other specified B group vitamins: Secondary | ICD-10-CM | POA: Diagnosis not present

## 2018-03-25 DIAGNOSIS — Z79899 Other long term (current) drug therapy: Secondary | ICD-10-CM | POA: Diagnosis not present

## 2018-03-25 DIAGNOSIS — Z125 Encounter for screening for malignant neoplasm of prostate: Secondary | ICD-10-CM | POA: Diagnosis not present

## 2018-03-25 DIAGNOSIS — E782 Mixed hyperlipidemia: Secondary | ICD-10-CM | POA: Insufficient documentation

## 2018-03-25 DIAGNOSIS — J431 Panlobular emphysema: Secondary | ICD-10-CM | POA: Diagnosis not present

## 2018-03-25 DIAGNOSIS — Z23 Encounter for immunization: Secondary | ICD-10-CM | POA: Diagnosis not present

## 2018-03-25 DIAGNOSIS — Z0000 Encounter for general adult medical examination without abnormal findings: Secondary | ICD-10-CM | POA: Diagnosis not present

## 2018-06-29 DIAGNOSIS — H353131 Nonexudative age-related macular degeneration, bilateral, early dry stage: Secondary | ICD-10-CM | POA: Diagnosis not present

## 2018-08-04 DIAGNOSIS — H26491 Other secondary cataract, right eye: Secondary | ICD-10-CM | POA: Diagnosis not present

## 2018-09-24 DIAGNOSIS — Z23 Encounter for immunization: Secondary | ICD-10-CM | POA: Diagnosis not present

## 2018-12-22 DIAGNOSIS — U071 COVID-19: Secondary | ICD-10-CM

## 2018-12-22 HISTORY — DX: COVID-19: U07.1

## 2018-12-27 DIAGNOSIS — K429 Umbilical hernia without obstruction or gangrene: Secondary | ICD-10-CM | POA: Diagnosis not present

## 2018-12-28 ENCOUNTER — Ambulatory Visit: Payer: Self-pay | Admitting: Surgery

## 2018-12-28 NOTE — H&P (Signed)
Subjective:   CC: umbilical hernia  HPI:  Anthony Morrison. is a 78 y.o. male who was referred by Yevonne Pax, MD for evaluation of above. Symptoms were first noted a few months ago. Asymptomatic but noticed a lump at the area that is reducible, increases in size with abdominal pressure.    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  Past Surgical History:       Past Surgical History:  Procedure Laterality Date  . COLONOSCOPY  06/12/2011   Adenomatous Polyp: CBF 05/2016; Recall Ltr mailed 04/01/2016 (dw)    Family History: family history includes Coronary Artery Disease (Blocked arteries around heart) in his mother; Diabetes type II in his mother.  Social History:  reports that he has quit smoking. He has never used smokeless tobacco. He reports that he does not drink alcohol. No history on file for drug.  Current Medications: has a current medication list which includes the following prescription(s): albuterol and cyanocobalamin/cobamamide.  Allergies:       Allergies as of 12/27/2018 - Reviewed 12/27/2018  Allergen Reaction Noted  . Sulfa (sulfonamide antibiotics) Unknown     ROS:  A 15 point review of systems was performed and pertinent positives and negatives noted in HPI   Objective:   BP 153/76   Pulse 72   Temp 36.4 C (97.5 F) (Oral)   Ht 167.6 cm (5\' 6" )   Wt 73.5 kg (162 lb)   BMI 26.15 kg/m   Constitutional :  alert, appears stated age, cooperative and no distress  Lymphatics/Throat:  no asymmetry, masses, or scars  Respiratory:  clear to auscultation bilaterally  Cardiovascular:  regular rate and rhythm  Gastrointestinal: soft, non-tender; bowel sounds normal; no masses,  no organomegaly. umbilical hernia noted.  small and reducible  Musculoskeletal: Steady gait and movement  Skin: Cool and moist.  Psychiatric: Normal affect,  non-agitated, not confused       LABS:  n/a   RADS: n/a Assessment:       Umbilical hernia without obstruction and without gangrene [K42.9]  Plan:   1. Umbilical hernia without obstruction and without gangrene [K42.9]   Discussed the risk of surgery including recurrence, which can be up to 50% in the case of incisional or complex hernias, possible use of prosthetic materials (mesh) and the increased risk of mesh infxn if used, bleeding, chronic pain, post-op infxn, post-op SBO or ileus, and possible re-operation to address said risks. The risks of general anesthetic, if used, includes MI, CVA, sudden death or even reaction to anesthetic medications also discussed. Alternatives include continued observation.  Benefits include possible symptom relief, prevention of incarceration, strangulation, enlargement in size over time, and the risk of emergency surgery in the face of strangulation.   Typical post-op recovery time of 3-5 days with 4-6 weeks of activity restrictions were also discussed.  ED return precautions given for sudden increase in pain, size of hernia with accompanying fever, nausea, and/or vomiting.  The patient verbalized understanding and all questions were answered to the patient's satisfaction.   2. Patient has elected to proceed with surgical treatment. Procedure will be scheduled.  Written consent was obtained.

## 2019-01-06 ENCOUNTER — Other Ambulatory Visit: Payer: Self-pay

## 2019-01-06 ENCOUNTER — Encounter
Admission: RE | Admit: 2019-01-06 | Discharge: 2019-01-06 | Disposition: A | Discharge status: Home / Self Care | Payer: PPO | Source: Ambulatory Visit | Attending: Surgery | Admitting: Surgery

## 2019-01-06 HISTORY — DX: Pneumonia, unspecified organism: J18.9

## 2019-01-06 HISTORY — DX: Malignant (primary) neoplasm, unspecified: C80.1

## 2019-01-06 HISTORY — DX: Unspecified osteoarthritis, unspecified site: M19.90

## 2019-01-06 HISTORY — DX: Chronic obstructive pulmonary disease, unspecified: J44.9

## 2019-01-06 NOTE — Patient Instructions (Signed)
Your procedure is scheduled on: 01-13-19  Report to Same Day Surgery 2nd floor medical mall [facility] Entrance-take elevator on left to 2nd floor.  Check in with surgery information desk.) To find out your arrival time please call [phone] between 1PM - 3PM on 01-12-19   Remember: Instructions that are not followed completely may result in serious medical risk, up to and including death, or upon the discretion of your surgeon and anesthesiologist your surgery may need to be rescheduled.    _x___ 1. Do not eat food after midnight the night before your procedure. You may drink clear liquids up to 2 hours before you are scheduled to arrive at the hospital for your procedure.  Do not drink clear liquids within 2 hours of your scheduled arrival to the hospital.  Clear liquids include  --Water or Apple juice without pulp  --Clear carbohydrate beverage such as ClearFast or Gatorade  --Black Coffee or Clear Tea (No milk, no creamers, do not add anything to the coffee or Tea   ____Ensure clear carbohydrate drink on the way to the hospital for bariatric patients  ____Ensure clear carbohydrate drink 3 hours before surgery for Dr Dwyane Luo patients if physician instructed.   No gum chewing or hard candies.     __x__ 2. No Alcohol for 24 hours before or after surgery.   __x__3. No Smoking or e-cigarettes for 24 prior to surgery.  Do not use any chewable tobacco products for at least 6 hour prior to surgery   ____  4. Bring all medications with you on the day of surgery if instructed.    __x__ 5. Notify your doctor if there is any change in your medical condition     (cold, fever, infections).    x___6. On the morning of surgery brush your teeth with toothpaste and water.  You may rinse your mouth with mouth wash if you wish.  Do not swallow any toothpaste or mouthwash.   Do not wear jewelry, make-up, hairpins, clips or nail polish.  Do not wear lotions, powders, or perfumes. You may wear  deodorant.  Do not shave 48 hours prior to surgery. Men may shave face and neck.  Do not bring valuables to the hospital.    [facility] is not responsible for any belongings or valuables.               Contacts, dentures or bridgework may not be worn into surgery.  Leave your suitcase in the car. After surgery it may be brought to your room.  For patients admitted to the hospital, discharge time is determined by your treatment team.  _  Patients discharged the day of surgery will not be allowed to drive home.  You will need someone to drive you home and stay with you the night of your procedure.    Please read over the following fact sheets that you were given:   [facility] Preparing for Surgery   ____ Take anti-hypertensive listed below, cardiac, seizure, asthma, anti-reflux and psychiatric medicines. These include:  1. NONE  2.  3.  4.  5.  6.  ____Fleets enema or Magnesium Citrate as directed.   _x___ Use CHG Soap or sage wipes as directed on instruction sheet   ____ Use inhalers on the day of surgery and bring to hospital day of surgery  ____ Stop Metformin and Janumet 2 days prior to surgery.    ____ Take 1/2 of usual insulin dose the night before surgery and  none on the morning surgery.   ____ Follow recommendations from Cardiologist, Pulmonologist or PCP regarding stopping Aspirin, Coumadin, Plavix ,Eliquis, Effient, or Pradaxa, and Pletal.  X____Stop Anti-inflammatories such as Advil, Aleve, Ibuprofen, Motrin, Naproxen,LODINE, Naprosyn, Goodies powders or aspirin products. OK to take Tylenol    ____ Stop supplements until after surgery.     ____ Bring C-Pap to the hospital.

## 2019-01-07 DIAGNOSIS — R9431 Abnormal electrocardiogram [ECG] [EKG]: Secondary | ICD-10-CM | POA: Diagnosis not present

## 2019-01-07 DIAGNOSIS — J431 Panlobular emphysema: Secondary | ICD-10-CM | POA: Diagnosis not present

## 2019-01-07 DIAGNOSIS — Z01810 Encounter for preprocedural cardiovascular examination: Secondary | ICD-10-CM | POA: Diagnosis not present

## 2019-01-10 DIAGNOSIS — R9431 Abnormal electrocardiogram [ECG] [EKG]: Secondary | ICD-10-CM | POA: Diagnosis not present

## 2019-01-10 DIAGNOSIS — I10 Essential (primary) hypertension: Secondary | ICD-10-CM | POA: Diagnosis not present

## 2019-01-10 DIAGNOSIS — E782 Mixed hyperlipidemia: Secondary | ICD-10-CM | POA: Diagnosis not present

## 2019-01-10 DIAGNOSIS — R0602 Shortness of breath: Secondary | ICD-10-CM | POA: Diagnosis not present

## 2019-01-10 DIAGNOSIS — J431 Panlobular emphysema: Secondary | ICD-10-CM | POA: Diagnosis not present

## 2019-01-10 DIAGNOSIS — Z01810 Encounter for preprocedural cardiovascular examination: Secondary | ICD-10-CM | POA: Diagnosis not present

## 2019-01-12 DIAGNOSIS — I208 Other forms of angina pectoris: Secondary | ICD-10-CM | POA: Diagnosis present

## 2019-01-13 ENCOUNTER — Encounter: Admission: RE | Payer: Self-pay | Source: Home / Self Care

## 2019-01-13 ENCOUNTER — Ambulatory Visit: Admission: RE | Admit: 2019-01-13 | Payer: PPO | Source: Home / Self Care | Admitting: Surgery

## 2019-01-13 SURGERY — REPAIR, HERNIA, UMBILICAL, ADULT
Anesthesia: General

## 2019-01-14 ENCOUNTER — Encounter
Admission: RE | Disposition: A | Discharge status: Other Acute Inpatient Hospital | Payer: Self-pay | Source: Home / Self Care | Attending: Internal Medicine

## 2019-01-14 ENCOUNTER — Inpatient Hospital Stay (HOSPITAL_COMMUNITY): Payer: PPO

## 2019-01-14 ENCOUNTER — Other Ambulatory Visit: Payer: Self-pay

## 2019-01-14 ENCOUNTER — Encounter (HOSPITAL_COMMUNITY): Payer: Self-pay | Admitting: Certified Registered Nurse Anesthetist

## 2019-01-14 ENCOUNTER — Ambulatory Visit (HOSPITAL_COMMUNITY)
Admission: AD | Disposition: A | Discharge status: Home / Self Care | Payer: Self-pay | Source: Other Acute Inpatient Hospital | Attending: Cardiothoracic Surgery

## 2019-01-14 ENCOUNTER — Inpatient Hospital Stay (HOSPITAL_COMMUNITY): Payer: PPO | Admitting: Anesthesiology

## 2019-01-14 ENCOUNTER — Ambulatory Visit
Admission: RE | Admit: 2019-01-14 | Discharge: 2019-01-14 | Disposition: A | Discharge status: Other Acute Inpatient Hospital | Payer: PPO | Attending: Internal Medicine | Admitting: Internal Medicine

## 2019-01-14 ENCOUNTER — Other Ambulatory Visit (HOSPITAL_COMMUNITY): Payer: Self-pay | Admitting: Cardiothoracic Surgery

## 2019-01-14 ENCOUNTER — Inpatient Hospital Stay (HOSPITAL_COMMUNITY)
Admission: AD | Admit: 2019-01-14 | Discharge: 2019-01-18 | DRG: 234 | Disposition: A | Discharge status: Home / Self Care | Payer: PPO | Source: Other Acute Inpatient Hospital | Attending: Cardiothoracic Surgery | Admitting: Cardiothoracic Surgery

## 2019-01-14 DIAGNOSIS — D696 Thrombocytopenia, unspecified: Secondary | ICD-10-CM | POA: Diagnosis not present

## 2019-01-14 DIAGNOSIS — E8770 Fluid overload, unspecified: Secondary | ICD-10-CM | POA: Diagnosis not present

## 2019-01-14 DIAGNOSIS — K429 Umbilical hernia without obstruction or gangrene: Secondary | ICD-10-CM | POA: Diagnosis not present

## 2019-01-14 DIAGNOSIS — I2510 Atherosclerotic heart disease of native coronary artery without angina pectoris: Secondary | ICD-10-CM | POA: Insufficient documentation

## 2019-01-14 DIAGNOSIS — Z951 Presence of aortocoronary bypass graft: Secondary | ICD-10-CM

## 2019-01-14 DIAGNOSIS — Z882 Allergy status to sulfonamides status: Secondary | ICD-10-CM

## 2019-01-14 DIAGNOSIS — Z87891 Personal history of nicotine dependence: Secondary | ICD-10-CM

## 2019-01-14 DIAGNOSIS — N289 Disorder of kidney and ureter, unspecified: Secondary | ICD-10-CM | POA: Diagnosis not present

## 2019-01-14 DIAGNOSIS — J9811 Atelectasis: Secondary | ICD-10-CM | POA: Diagnosis not present

## 2019-01-14 DIAGNOSIS — Z01810 Encounter for preprocedural cardiovascular examination: Secondary | ICD-10-CM

## 2019-01-14 DIAGNOSIS — I6523 Occlusion and stenosis of bilateral carotid arteries: Secondary | ICD-10-CM | POA: Diagnosis present

## 2019-01-14 DIAGNOSIS — D62 Acute posthemorrhagic anemia: Secondary | ICD-10-CM | POA: Diagnosis not present

## 2019-01-14 DIAGNOSIS — I493 Ventricular premature depolarization: Secondary | ICD-10-CM | POA: Diagnosis not present

## 2019-01-14 DIAGNOSIS — J939 Pneumothorax, unspecified: Secondary | ICD-10-CM | POA: Diagnosis not present

## 2019-01-14 DIAGNOSIS — E782 Mixed hyperlipidemia: Secondary | ICD-10-CM | POA: Insufficient documentation

## 2019-01-14 DIAGNOSIS — Z85828 Personal history of other malignant neoplasm of skin: Secondary | ICD-10-CM

## 2019-01-14 DIAGNOSIS — I25118 Atherosclerotic heart disease of native coronary artery with other forms of angina pectoris: Secondary | ICD-10-CM

## 2019-01-14 DIAGNOSIS — Z8249 Family history of ischemic heart disease and other diseases of the circulatory system: Secondary | ICD-10-CM | POA: Diagnosis not present

## 2019-01-14 DIAGNOSIS — I25110 Atherosclerotic heart disease of native coronary artery with unstable angina pectoris: Secondary | ICD-10-CM | POA: Diagnosis not present

## 2019-01-14 DIAGNOSIS — Z9842 Cataract extraction status, left eye: Secondary | ICD-10-CM | POA: Diagnosis not present

## 2019-01-14 DIAGNOSIS — E119 Type 2 diabetes mellitus without complications: Secondary | ICD-10-CM | POA: Diagnosis present

## 2019-01-14 DIAGNOSIS — Z9841 Cataract extraction status, right eye: Secondary | ICD-10-CM

## 2019-01-14 DIAGNOSIS — I081 Rheumatic disorders of both mitral and tricuspid valves: Secondary | ICD-10-CM | POA: Diagnosis not present

## 2019-01-14 DIAGNOSIS — R9430 Abnormal result of cardiovascular function study, unspecified: Secondary | ICD-10-CM | POA: Diagnosis not present

## 2019-01-14 DIAGNOSIS — Z09 Encounter for follow-up examination after completed treatment for conditions other than malignant neoplasm: Secondary | ICD-10-CM

## 2019-01-14 DIAGNOSIS — I371 Nonrheumatic pulmonary valve insufficiency: Secondary | ICD-10-CM | POA: Diagnosis not present

## 2019-01-14 DIAGNOSIS — Z9689 Presence of other specified functional implants: Secondary | ICD-10-CM

## 2019-01-14 DIAGNOSIS — I679 Cerebrovascular disease, unspecified: Secondary | ICD-10-CM | POA: Diagnosis not present

## 2019-01-14 DIAGNOSIS — M1990 Unspecified osteoarthritis, unspecified site: Secondary | ICD-10-CM | POA: Diagnosis present

## 2019-01-14 DIAGNOSIS — R0602 Shortness of breath: Secondary | ICD-10-CM | POA: Diagnosis not present

## 2019-01-14 DIAGNOSIS — I10 Essential (primary) hypertension: Secondary | ICD-10-CM | POA: Diagnosis not present

## 2019-01-14 DIAGNOSIS — Z01818 Encounter for other preprocedural examination: Secondary | ICD-10-CM | POA: Diagnosis not present

## 2019-01-14 DIAGNOSIS — J90 Pleural effusion, not elsewhere classified: Secondary | ICD-10-CM | POA: Diagnosis not present

## 2019-01-14 DIAGNOSIS — J449 Chronic obstructive pulmonary disease, unspecified: Secondary | ICD-10-CM | POA: Diagnosis not present

## 2019-01-14 DIAGNOSIS — I2089 Other forms of angina pectoris: Secondary | ICD-10-CM | POA: Diagnosis present

## 2019-01-14 DIAGNOSIS — Z9889 Other specified postprocedural states: Secondary | ICD-10-CM | POA: Diagnosis not present

## 2019-01-14 DIAGNOSIS — I208 Other forms of angina pectoris: Secondary | ICD-10-CM | POA: Diagnosis present

## 2019-01-14 HISTORY — PX: TEE WITHOUT CARDIOVERSION: SHX5443

## 2019-01-14 HISTORY — PX: LEFT HEART CATH AND CORONARY ANGIOGRAPHY: CATH118249

## 2019-01-14 HISTORY — PX: ENDOVEIN HARVEST OF GREATER SAPHENOUS VEIN: SHX5059

## 2019-01-14 HISTORY — PX: CORONARY ARTERY BYPASS GRAFT: SHX141

## 2019-01-14 LAB — POCT I-STAT 7, (LYTES, BLD GAS, ICA,H+H)
Acid-base deficit: 1 mmol/L (ref 0.0–2.0)
Acid-base deficit: 3 mmol/L — ABNORMAL HIGH (ref 0.0–2.0)
Acid-base deficit: 3 mmol/L — ABNORMAL HIGH (ref 0.0–2.0)
Bicarbonate: 24.6 mmol/L (ref 20.0–28.0)
Bicarbonate: 24.8 mmol/L (ref 20.0–28.0)
Bicarbonate: 23.6 mmol/L (ref 20.0–28.0)
Bicarbonate: 23.2 mmol/L (ref 20.0–28.0)
Calcium, Ion: 0.99 mmol/L — ABNORMAL LOW (ref 1.15–1.40)
Calcium, Ion: 1.01 mmol/L — ABNORMAL LOW (ref 1.15–1.40)
Calcium, Ion: 0.98 mmol/L — ABNORMAL LOW (ref 1.15–1.40)
Calcium, Ion: 1.01 mmol/L — ABNORMAL LOW (ref 1.15–1.40)
HCT: 25 % — ABNORMAL LOW (ref 39.0–52.0)
HCT: 26 % — ABNORMAL LOW (ref 39.0–52.0)
HCT: 24 % — ABNORMAL LOW (ref 39.0–52.0)
HCT: 33 % — ABNORMAL LOW (ref 39.0–52.0)
Hemoglobin: 8.5 g/dL — ABNORMAL LOW (ref 13.0–17.0)
Hemoglobin: 8.8 g/dL — ABNORMAL LOW (ref 13.0–17.0)
Hemoglobin: 8.2 g/dL — ABNORMAL LOW (ref 13.0–17.0)
Hemoglobin: 11.2 g/dL — ABNORMAL LOW (ref 13.0–17.0)
O2 Saturation: 100 %
O2 Saturation: 100 %
O2 Saturation: 100 %
O2 Saturation: 98 %
Patient temperature: 35.9
Potassium: 3.8 mmol/L (ref 3.5–5.1)
Potassium: 4.3 mmol/L (ref 3.5–5.1)
Potassium: 3.8 mmol/L (ref 3.5–5.1)
Potassium: 4 mmol/L (ref 3.5–5.1)
Sodium: 137 mmol/L (ref 135–145)
Sodium: 141 mmol/L (ref 135–145)
Sodium: 141 mmol/L (ref 135–145)
Sodium: 142 mmol/L (ref 135–145)
TCO2: 26 mmol/L (ref 22–32)
TCO2: 26 mmol/L (ref 22–32)
TCO2: 25 mmol/L (ref 22–32)
TCO2: 25 mmol/L (ref 22–32)
pCO2 arterial: 40.4 mmHg (ref 32.0–48.0)
pCO2 arterial: 42.8 mmHg (ref 32.0–48.0)
pCO2 arterial: 49.1 mmHg — ABNORMAL HIGH (ref 32.0–48.0)
pCO2 arterial: 45.2 mmHg (ref 32.0–48.0)
pH, Arterial: 7.371 (ref 7.350–7.450)
pH, Arterial: 7.393 (ref 7.350–7.450)
pH, Arterial: 7.289 — ABNORMAL LOW (ref 7.350–7.450)
pH, Arterial: 7.313 — ABNORMAL LOW (ref 7.350–7.450)
pO2, Arterial: 331 mmHg — ABNORMAL HIGH (ref 83.0–108.0)
pO2, Arterial: 358 mmHg — ABNORMAL HIGH (ref 83.0–108.0)
pO2, Arterial: 485 mmHg — ABNORMAL HIGH (ref 83.0–108.0)
pO2, Arterial: 109 mmHg — ABNORMAL HIGH (ref 83.0–108.0)

## 2019-01-14 LAB — COMPREHENSIVE METABOLIC PANEL
ALT: 19 U/L (ref 0–44)
AST: 18 U/L (ref 15–41)
Albumin: 3.4 g/dL — ABNORMAL LOW (ref 3.5–5.0)
Alkaline Phosphatase: 67 U/L (ref 38–126)
Anion gap: 5 (ref 5–15)
BUN: 12 mg/dL (ref 8–23)
CO2: 25 mmol/L (ref 22–32)
Calcium: 8.5 mg/dL — ABNORMAL LOW (ref 8.9–10.3)
Chloride: 110 mmol/L (ref 98–111)
Creatinine, Ser: 1.11 mg/dL (ref 0.61–1.24)
GFR calc Af Amer: >60 mL/min (ref >60)
GFR calc non Af Amer: >60 mL/min (ref >60)
Glucose, Bld: 94 mg/dL (ref 70–99)
Potassium: 3.7 mmol/L (ref 3.5–5.1)
Sodium: 140 mmol/L (ref 135–145)
Total Bilirubin: 0.7 mg/dL (ref 0.3–1.2)
Total Protein: 5.7 g/dL — ABNORMAL LOW (ref 6.5–8.1)

## 2019-01-14 LAB — POCT I-STAT 4, (NA,K, GLUC, HGB,HCT)
Glucose, Bld: 94 mg/dL (ref 70–99)
Glucose, Bld: 93 mg/dL (ref 70–99)
Glucose, Bld: 86 mg/dL (ref 70–99)
Glucose, Bld: 86 mg/dL (ref 70–99)
Glucose, Bld: 104 mg/dL — ABNORMAL HIGH (ref 70–99)
Glucose, Bld: 113 mg/dL — ABNORMAL HIGH (ref 70–99)
HCT: 38 % — ABNORMAL LOW (ref 39.0–52.0)
HCT: 35 % — ABNORMAL LOW (ref 39.0–52.0)
HCT: 25 % — ABNORMAL LOW (ref 39.0–52.0)
HCT: 26 % — ABNORMAL LOW (ref 39.0–52.0)
HCT: 26 % — ABNORMAL LOW (ref 39.0–52.0)
HCT: 22 % — ABNORMAL LOW (ref 39.0–52.0)
Hemoglobin: 12.9 g/dL — ABNORMAL LOW (ref 13.0–17.0)
Hemoglobin: 11.9 g/dL — ABNORMAL LOW (ref 13.0–17.0)
Hemoglobin: 8.5 g/dL — ABNORMAL LOW (ref 13.0–17.0)
Hemoglobin: 8.8 g/dL — ABNORMAL LOW (ref 13.0–17.0)
Hemoglobin: 8.8 g/dL — ABNORMAL LOW (ref 13.0–17.0)
Hemoglobin: 7.5 g/dL — ABNORMAL LOW (ref 13.0–17.0)
Potassium: 4.2 mmol/L (ref 3.5–5.1)
Potassium: 4.1 mmol/L (ref 3.5–5.1)
Potassium: 3.8 mmol/L (ref 3.5–5.1)
Potassium: 4.3 mmol/L (ref 3.5–5.1)
Potassium: 4.9 mmol/L (ref 3.5–5.1)
Potassium: 4 mmol/L (ref 3.5–5.1)
Sodium: 142 mmol/L (ref 135–145)
Sodium: 141 mmol/L (ref 135–145)
Sodium: 137 mmol/L (ref 135–145)
Sodium: 141 mmol/L (ref 135–145)
Sodium: 143 mmol/L (ref 135–145)
Sodium: 140 mmol/L (ref 135–145)

## 2019-01-14 LAB — APTT
aPTT: 31 seconds (ref 24–36)
aPTT: 38 seconds — ABNORMAL HIGH (ref 24–36)

## 2019-01-14 LAB — CBC
HCT: 42.3 % (ref 39.0–52.0)
HCT: 33.7 % — ABNORMAL LOW (ref 39.0–52.0)
Hemoglobin: 13.8 g/dL (ref 13.0–17.0)
Hemoglobin: 11 g/dL — ABNORMAL LOW (ref 13.0–17.0)
MCH: 30.1 pg (ref 26.0–34.0)
MCH: 30.5 pg (ref 26.0–34.0)
MCHC: 32.6 g/dL (ref 30.0–36.0)
MCHC: 32.6 g/dL (ref 30.0–36.0)
MCV: 92.4 fL (ref 80.0–100.0)
MCV: 93.4 fL (ref 80.0–100.0)
PLATELETS: 110 10*3/uL — AB (ref 150–400)
Platelets: 185 10*3/uL (ref 150–400)
RBC: 4.58 MIL/uL (ref 4.22–5.81)
RBC: 3.61 MIL/uL — ABNORMAL LOW (ref 4.22–5.81)
RDW: 13 % (ref 11.5–15.5)
RDW: 12.8 % (ref 11.5–15.5)
WBC: 6.6 10*3/uL (ref 4.0–10.5)
WBC: 10 10*3/uL (ref 4.0–10.5)
nRBC: 0 % (ref 0.0–0.2)
nRBC: 0 % (ref 0.0–0.2)

## 2019-01-14 LAB — PROTIME-INR
INR: 1.11
INR: 1.73
Prothrombin Time: 14.2 seconds (ref 11.4–15.2)
Prothrombin Time: 20.1 seconds — ABNORMAL HIGH (ref 11.4–15.2)

## 2019-01-14 LAB — ABO/RH: ABO/RH(D): O POS

## 2019-01-14 LAB — HEMOGLOBIN AND HEMATOCRIT, BLOOD
HCT: 29 % — ABNORMAL LOW (ref 39.0–52.0)
Hemoglobin: 9.8 g/dL — ABNORMAL LOW (ref 13.0–17.0)

## 2019-01-14 LAB — GLUCOSE, CAPILLARY
Glucose-Capillary: 108 mg/dL — ABNORMAL HIGH (ref 70–99)
Glucose-Capillary: 116 mg/dL — ABNORMAL HIGH (ref 70–99)
Glucose-Capillary: 116 mg/dL — ABNORMAL HIGH (ref 70–99)
Glucose-Capillary: 125 mg/dL — ABNORMAL HIGH (ref 70–99)

## 2019-01-14 LAB — TYPE AND SCREEN
ABO/RH(D): O POS
Antibody Screen: NEGATIVE

## 2019-01-14 LAB — PLATELET COUNT: Platelets: 127 10*3/uL — ABNORMAL LOW (ref 150–400)

## 2019-01-14 LAB — MRSA PCR SCREENING: MRSA by PCR: NEGATIVE

## 2019-01-14 SURGERY — LEFT HEART CATH AND CORONARY ANGIOGRAPHY
Anesthesia: Moderate Sedation | Laterality: Left

## 2019-01-14 SURGERY — CORONARY ARTERY BYPASS GRAFTING (CABG)
Anesthesia: General | Site: Leg Upper | Laterality: Right

## 2019-01-14 MED ORDER — MOMETASONE FURO-FORMOTEROL FUM 200-5 MCG/ACT IN AERO
2.0000 | INHALATION_SPRAY | Freq: Two times a day (BID) | RESPIRATORY_TRACT | Status: DC
  Administered 2019-01-15 – 2019-01-18 (×7): 2 via RESPIRATORY_TRACT
  Filled 2019-01-14: qty 8.8

## 2019-01-14 MED ORDER — VANCOMYCIN HCL IN DEXTROSE 1-5 GM/200ML-% IV SOLN
1000.0000 mg | Freq: Once | INTRAVENOUS | Status: AC
  Administered 2019-01-15: 1000 mg via INTRAVENOUS
  Filled 2019-01-14: qty 200

## 2019-01-14 MED ORDER — CHLORHEXIDINE GLUCONATE 0.12 % MT SOLN: 15.0000 mL | Freq: Once | OROMUCOSAL | Status: DC

## 2019-01-14 MED ORDER — ALBUMIN HUMAN 5 % IV SOLN
INTRAVENOUS | Status: DC | PRN
  Administered 2019-01-14: 19:00:00 via INTRAVENOUS

## 2019-01-14 MED ORDER — DEXMEDETOMIDINE HCL IN NACL 200 MCG/50ML IV SOLN
0.0000 ug/kg/h | INTRAVENOUS | Status: DC
  Administered 2019-01-14: 0.3 ug/kg/h via INTRAVENOUS
  Administered 2019-01-14: 0.5 ug/kg/h via INTRAVENOUS
  Filled 2019-01-14: qty 50

## 2019-01-14 MED ORDER — CHLORHEXIDINE GLUCONATE 0.12 % MT SOLN
15.0000 mL | OROMUCOSAL | Status: AC
  Administered 2019-01-14: 15 mL via OROMUCOSAL

## 2019-01-14 MED ORDER — CHLORHEXIDINE GLUCONATE CLOTH 2 % EX PADS: 6.0000 | MEDICATED_PAD | Freq: Once | CUTANEOUS | Status: DC

## 2019-01-14 MED ORDER — TRANEXAMIC ACID (OHS) BOLUS VIA INFUSION
15.0000 mg/kg | INTRAVENOUS | Status: AC
  Administered 2019-01-14: 1089 mg via INTRAVENOUS
  Filled 2019-01-14: qty 1089

## 2019-01-14 MED ORDER — SODIUM CHLORIDE 0.9% FLUSH
3.0000 mL | Freq: Two times a day (BID) | INTRAVENOUS | Status: DC
  Administered 2019-01-15 – 2019-01-17 (×5): 3 mL via INTRAVENOUS

## 2019-01-14 MED ORDER — TRAMADOL HCL 50 MG PO TABS
50.0000 mg | ORAL_TABLET | ORAL | Status: DC | PRN
  Administered 2019-01-16: 50 mg via ORAL
  Filled 2019-01-14: qty 1

## 2019-01-14 MED ORDER — 0.9 % SODIUM CHLORIDE (POUR BTL) OPTIME
TOPICAL | Status: DC | PRN
  Administered 2019-01-14: 5000 mL

## 2019-01-14 MED ORDER — PROPOFOL 10 MG/ML IV BOLUS
INTRAVENOUS | Status: DC | PRN
  Administered 2019-01-14 (×4): 50 mg via INTRAVENOUS

## 2019-01-14 MED ORDER — ASPIRIN 81 MG PO CHEW
81.0000 mg | CHEWABLE_TABLET | ORAL | Status: AC
  Administered 2019-01-14: 81 mg via ORAL

## 2019-01-14 MED ORDER — METOPROLOL TARTRATE 5 MG/5ML IV SOLN: 2.5000 mg | INTRAVENOUS | Status: DC | PRN

## 2019-01-14 MED ORDER — HEPARIN (PORCINE) IN NACL 1000-0.9 UT/500ML-% IV SOLN
INTRAVENOUS | Status: DC | PRN
  Administered 2019-01-14: 500 mL

## 2019-01-14 MED ORDER — ACETAMINOPHEN 500 MG PO TABS
1000.0000 mg | ORAL_TABLET | Freq: Four times a day (QID) | ORAL | Status: DC
  Administered 2019-01-15 – 2019-01-18 (×13): 1000 mg via ORAL
  Filled 2019-01-14 (×13): qty 2

## 2019-01-14 MED ORDER — ALBUTEROL SULFATE HFA 108 (90 BASE) MCG/ACT IN AERS
INHALATION_SPRAY | RESPIRATORY_TRACT | Status: DC | PRN
  Administered 2019-01-14: 6 via RESPIRATORY_TRACT

## 2019-01-14 MED ORDER — SODIUM CHLORIDE 0.9 % WEIGHT BASED INFUSION
3.0000 mL/kg/h | INTRAVENOUS | Status: AC
  Administered 2019-01-14: 3 mL/kg/h via INTRAVENOUS

## 2019-01-14 MED ORDER — SODIUM CHLORIDE 0.9 % IV SOLN
INTRAVENOUS | Status: DC
  Filled 2019-01-14: qty 30

## 2019-01-14 MED ORDER — SODIUM CHLORIDE 0.9 % IV SOLN
INTRAVENOUS | Status: DC | PRN
  Administered 2019-01-14: 18:00:00 via INTRAVENOUS

## 2019-01-14 MED ORDER — SODIUM CHLORIDE 0.9 % IV SOLN
INTRAVENOUS | Status: DC
  Administered 2019-01-15: 02:00:00 via INTRAVENOUS

## 2019-01-14 MED ORDER — MIDAZOLAM HCL 2 MG/2ML IJ SOLN
INTRAMUSCULAR | Status: DC | PRN
  Administered 2019-01-14: 1 mg via INTRAVENOUS

## 2019-01-14 MED ORDER — DEXMEDETOMIDINE HCL IN NACL 400 MCG/100ML IV SOLN
0.1000 ug/kg/h | INTRAVENOUS | Status: AC
  Administered 2019-01-14: .3 ug/kg/h via INTRAVENOUS
  Filled 2019-01-14: qty 100

## 2019-01-14 MED ORDER — SODIUM BICARBONATE 8.4 % IV SOLN
25.0000 meq | Freq: Once | INTRAVENOUS | Status: AC
  Administered 2019-01-14: 25 meq via INTRAVENOUS

## 2019-01-14 MED ORDER — ONDANSETRON HCL 4 MG/2ML IJ SOLN: 4.0000 mg | Freq: Four times a day (QID) | INTRAMUSCULAR | Status: DC | PRN

## 2019-01-14 MED ORDER — NITROGLYCERIN IN D5W 200-5 MCG/ML-% IV SOLN: 0.0000 ug/min | INTRAVENOUS | Status: DC

## 2019-01-14 MED ORDER — POTASSIUM CHLORIDE 2 MEQ/ML IV SOLN
80.0000 meq | INTRAVENOUS | Status: DC
  Filled 2019-01-14: qty 40

## 2019-01-14 MED ORDER — BISACODYL 5 MG PO TBEC: 5.0000 mg | DELAYED_RELEASE_TABLET | Freq: Once | ORAL | Status: DC

## 2019-01-14 MED ORDER — METOCLOPRAMIDE HCL 5 MG/ML IJ SOLN
10.0000 mg | Freq: Four times a day (QID) | INTRAMUSCULAR | Status: AC
  Administered 2019-01-15 (×3): 10 mg via INTRAVENOUS
  Filled 2019-01-14 (×3): qty 2

## 2019-01-14 MED ORDER — ASPIRIN EC 325 MG PO TBEC
325.0000 mg | DELAYED_RELEASE_TABLET | Freq: Every day | ORAL | Status: DC
  Administered 2019-01-15 – 2019-01-18 (×4): 325 mg via ORAL
  Filled 2019-01-14 (×4): qty 1

## 2019-01-14 MED ORDER — HEPARIN (PORCINE) IN NACL 1000-0.9 UT/500ML-% IV SOLN
INTRAVENOUS | Status: AC
  Filled 2019-01-14: qty 1000

## 2019-01-14 MED ORDER — IOPAMIDOL (ISOVUE-300) INJECTION 61%
INTRAVENOUS | Status: DC | PRN
  Administered 2019-01-14: 130 mL via INTRA_ARTERIAL

## 2019-01-14 MED ORDER — DOCUSATE SODIUM 100 MG PO CAPS
200.0000 mg | ORAL_CAPSULE | Freq: Every day | ORAL | Status: DC
  Administered 2019-01-15 – 2019-01-17 (×3): 200 mg via ORAL
  Filled 2019-01-14 (×4): qty 2

## 2019-01-14 MED ORDER — PROTAMINE SULFATE 10 MG/ML IV SOLN
INTRAVENOUS | Status: DC | PRN
  Administered 2019-01-14: 220 mg via INTRAVENOUS

## 2019-01-14 MED ORDER — SODIUM CHLORIDE 0.45 % IV SOLN: INTRAVENOUS | Status: DC | PRN

## 2019-01-14 MED ORDER — SODIUM CHLORIDE 0.9 % IV SOLN: 250.0000 mL | INTRAVENOUS | Status: DC

## 2019-01-14 MED ORDER — TEMAZEPAM 15 MG PO CAPS: 15.0000 mg | ORAL_CAPSULE | Freq: Once | ORAL | Status: DC | PRN

## 2019-01-14 MED ORDER — BISACODYL 10 MG RE SUPP: 10.0000 mg | Freq: Every day | RECTAL | Status: DC

## 2019-01-14 MED ORDER — HEPARIN SODIUM (PORCINE) 1000 UNIT/ML IJ SOLN
INTRAMUSCULAR | Status: DC | PRN
  Administered 2019-01-14: 22000 [IU] via INTRAVENOUS

## 2019-01-14 MED ORDER — LACTATED RINGERS IV SOLN
500.0000 mL | Freq: Once | INTRAVENOUS | Status: AC | PRN
  Administered 2019-01-15: 500 mL via INTRAVENOUS

## 2019-01-14 MED ORDER — PHENYLEPHRINE HCL-NACL 20-0.9 MG/250ML-% IV SOLN
30.0000 ug/min | INTRAVENOUS | Status: AC
  Administered 2019-01-14: 50 ug/min via INTRAVENOUS
  Filled 2019-01-14: qty 250

## 2019-01-14 MED ORDER — FAMOTIDINE IN NACL 20-0.9 MG/50ML-% IV SOLN
20.0000 mg | Freq: Two times a day (BID) | INTRAVENOUS | Status: DC
  Administered 2019-01-14: 20 mg via INTRAVENOUS

## 2019-01-14 MED ORDER — FENTANYL CITRATE (PF) 250 MCG/5ML IJ SOLN
INTRAMUSCULAR | Status: DC | PRN
  Administered 2019-01-14: 150 ug via INTRAVENOUS
  Administered 2019-01-14 (×2): 50 ug via INTRAVENOUS
  Administered 2019-01-14: 100 ug via INTRAVENOUS
  Administered 2019-01-14: 150 ug via INTRAVENOUS
  Administered 2019-01-14: 200 ug via INTRAVENOUS
  Administered 2019-01-14 (×2): 100 ug via INTRAVENOUS
  Administered 2019-01-14: 150 ug via INTRAVENOUS
  Administered 2019-01-14: 200 ug via INTRAVENOUS

## 2019-01-14 MED ORDER — SODIUM CHLORIDE 0.9 % IV SOLN: 250.0000 mL | INTRAVENOUS | Status: DC | PRN

## 2019-01-14 MED ORDER — ALBUTEROL SULFATE (2.5 MG/3ML) 0.083% IN NEBU: 2.5000 mg | INHALATION_SOLUTION | Freq: Four times a day (QID) | RESPIRATORY_TRACT | Status: DC | PRN

## 2019-01-14 MED ORDER — DOPAMINE-DEXTROSE 3.2-5 MG/ML-% IV SOLN
0.0000 ug/kg/min | INTRAVENOUS | Status: AC
  Administered 2019-01-14: 3 ug/kg/min via INTRAVENOUS
  Filled 2019-01-14: qty 250

## 2019-01-14 MED ORDER — METOPROLOL TARTRATE 25 MG/10 ML ORAL SUSPENSION: 12.5000 mg | Freq: Two times a day (BID) | ORAL | Status: DC

## 2019-01-14 MED ORDER — ASPIRIN 81 MG PO CHEW
CHEWABLE_TABLET | ORAL | Status: AC
  Filled 2019-01-14: qty 1

## 2019-01-14 MED ORDER — METOPROLOL TARTRATE 12.5 MG HALF TABLET
12.5000 mg | ORAL_TABLET | Freq: Once | ORAL | Status: AC
  Administered 2019-01-14: 12.5 mg via ORAL
  Filled 2019-01-14: qty 1

## 2019-01-14 MED ORDER — FENTANYL CITRATE (PF) 100 MCG/2ML IJ SOLN
INTRAMUSCULAR | Status: AC
  Filled 2019-01-14: qty 2

## 2019-01-14 MED ORDER — PHENYLEPHRINE HCL-NACL 20-0.9 MG/250ML-% IV SOLN
0.0000 ug/min | INTRAVENOUS | Status: DC
  Administered 2019-01-15: 35 ug/min via INTRAVENOUS
  Filled 2019-01-14: qty 250

## 2019-01-14 MED ORDER — FENTANYL CITRATE (PF) 100 MCG/2ML IJ SOLN
INTRAMUSCULAR | Status: DC | PRN
  Administered 2019-01-14: 25 ug via INTRAVENOUS

## 2019-01-14 MED ORDER — INSULIN REGULAR BOLUS VIA INFUSION
0.0000 [IU] | Freq: Three times a day (TID) | INTRAVENOUS | Status: DC
  Filled 2019-01-14: qty 10

## 2019-01-14 MED ORDER — INSULIN REGULAR(HUMAN) IN NACL 100-0.9 UT/100ML-% IV SOLN: INTRAVENOUS | Status: DC

## 2019-01-14 MED ORDER — MAGNESIUM SULFATE 50 % IJ SOLN
40.0000 meq | INTRAMUSCULAR | Status: DC
  Filled 2019-01-14: qty 9.85

## 2019-01-14 MED ORDER — MAGNESIUM SULFATE 4 GM/100ML IV SOLN
4.0000 g | Freq: Once | INTRAVENOUS | Status: AC
  Administered 2019-01-14: 4 g via INTRAVENOUS
  Filled 2019-01-14: qty 100

## 2019-01-14 MED ORDER — LACTATED RINGERS IV SOLN
INTRAVENOUS | Status: DC | PRN
  Administered 2019-01-14: 13:00:00 via INTRAVENOUS

## 2019-01-14 MED ORDER — MIDAZOLAM HCL 2 MG/2ML IJ SOLN
2.0000 mg | INTRAMUSCULAR | Status: DC | PRN
  Administered 2019-01-15: 2 mg via INTRAVENOUS
  Filled 2019-01-14: qty 2

## 2019-01-14 MED ORDER — LEVALBUTEROL HCL 0.63 MG/3ML IN NEBU
INHALATION_SOLUTION | RESPIRATORY_TRACT | Status: AC
  Filled 2019-01-14: qty 3

## 2019-01-14 MED ORDER — POTASSIUM CHLORIDE 10 MEQ/50ML IV SOLN: 10.0000 meq | INTRAVENOUS | Status: AC

## 2019-01-14 MED ORDER — VERAPAMIL HCL 2.5 MG/ML IV SOLN
INTRAVENOUS | Status: AC
  Filled 2019-01-14: qty 2

## 2019-01-14 MED ORDER — METOPROLOL TARTRATE 12.5 MG HALF TABLET
12.5000 mg | ORAL_TABLET | Freq: Two times a day (BID) | ORAL | Status: DC
  Administered 2019-01-15 – 2019-01-16 (×4): 12.5 mg via ORAL
  Filled 2019-01-14 (×4): qty 1

## 2019-01-14 MED ORDER — LACTATED RINGERS IV SOLN: INTRAVENOUS | Status: DC

## 2019-01-14 MED ORDER — MILRINONE LACTATE IN DEXTROSE 20-5 MG/100ML-% IV SOLN
0.3000 ug/kg/min | INTRAVENOUS | Status: DC
  Filled 2019-01-14: qty 100

## 2019-01-14 MED ORDER — LEVALBUTEROL HCL 0.63 MG/3ML IN NEBU
0.6300 mg | INHALATION_SOLUTION | Freq: Four times a day (QID) | RESPIRATORY_TRACT | Status: DC
  Administered 2019-01-14 – 2019-01-17 (×12): 0.63 mg via RESPIRATORY_TRACT
  Filled 2019-01-14 (×11): qty 3

## 2019-01-14 MED ORDER — SODIUM CHLORIDE 0.9 % WEIGHT BASED INFUSION: 1.0000 mL/kg/h | INTRAVENOUS | Status: DC

## 2019-01-14 MED ORDER — LACTATED RINGERS IV SOLN
INTRAVENOUS | Status: DC
  Administered 2019-01-14 – 2019-01-15 (×2): via INTRAVENOUS

## 2019-01-14 MED ORDER — HEMOSTATIC AGENTS (NO CHARGE) OPTIME
TOPICAL | Status: DC | PRN
  Administered 2019-01-14: 1 via TOPICAL
  Administered 2019-01-14: 3 via TOPICAL
  Administered 2019-01-14: 1 via TOPICAL

## 2019-01-14 MED ORDER — TRANEXAMIC ACID 1000 MG/10ML IV SOLN
1.5000 mg/kg/h | INTRAVENOUS | Status: AC
  Administered 2019-01-14: 1.5 mg/kg/h via INTRAVENOUS
  Filled 2019-01-14: qty 25

## 2019-01-14 MED ORDER — ROCURONIUM BROMIDE 10 MG/ML (PF) SYRINGE
PREFILLED_SYRINGE | INTRAVENOUS | Status: DC | PRN
  Administered 2019-01-14 (×2): 50 mg via INTRAVENOUS
  Administered 2019-01-14: 30 mg via INTRAVENOUS
  Administered 2019-01-14: 50 mg via INTRAVENOUS

## 2019-01-14 MED ORDER — SODIUM CHLORIDE 0.9% FLUSH: 3.0000 mL | INTRAVENOUS | Status: DC | PRN

## 2019-01-14 MED ORDER — MIDAZOLAM HCL 5 MG/5ML IJ SOLN
INTRAMUSCULAR | Status: DC | PRN
  Administered 2019-01-14 (×5): 2 mg via INTRAVENOUS

## 2019-01-14 MED ORDER — HEPARIN SODIUM (PORCINE) 1000 UNIT/ML IJ SOLN
INTRAMUSCULAR | Status: AC
  Filled 2019-01-14: qty 1

## 2019-01-14 MED ORDER — ALBUMIN HUMAN 5 % IV SOLN
250.0000 mL | INTRAVENOUS | Status: AC | PRN
  Administered 2019-01-14 – 2019-01-15 (×4): 12.5 g via INTRAVENOUS
  Filled 2019-01-14 (×2): qty 250

## 2019-01-14 MED ORDER — OXYCODONE HCL 5 MG PO TABS
5.0000 mg | ORAL_TABLET | ORAL | Status: DC | PRN
  Administered 2019-01-15: 10 mg via ORAL
  Administered 2019-01-15: 5 mg via ORAL
  Filled 2019-01-14: qty 2
  Filled 2019-01-14: qty 1

## 2019-01-14 MED ORDER — SODIUM CHLORIDE 0.9 % IV SOLN
750.0000 mg | INTRAVENOUS | Status: DC
  Filled 2019-01-14: qty 750

## 2019-01-14 MED ORDER — NITROGLYCERIN IN D5W 200-5 MCG/ML-% IV SOLN
2.0000 ug/min | INTRAVENOUS | Status: DC
  Filled 2019-01-14: qty 250

## 2019-01-14 MED ORDER — MIDAZOLAM HCL 2 MG/2ML IJ SOLN
INTRAMUSCULAR | Status: AC
  Filled 2019-01-14: qty 2

## 2019-01-14 MED ORDER — SUCCINYLCHOLINE CHLORIDE 20 MG/ML IJ SOLN
INTRAMUSCULAR | Status: DC | PRN
  Administered 2019-01-14: 120 mg via INTRAVENOUS

## 2019-01-14 MED ORDER — SODIUM CHLORIDE 0.9% FLUSH: 3.0000 mL | Freq: Two times a day (BID) | INTRAVENOUS | Status: DC

## 2019-01-14 MED ORDER — LACTATED RINGERS IV SOLN
INTRAVENOUS | Status: DC | PRN
  Administered 2019-01-14 (×2): via INTRAVENOUS

## 2019-01-14 MED ORDER — PLASMA-LYTE 148 IV SOLN
INTRAVENOUS | Status: AC
  Administered 2019-01-14: 500 mL
  Filled 2019-01-14: qty 2.5

## 2019-01-14 MED ORDER — SODIUM CHLORIDE 0.9 % IV SOLN
1.5000 g | INTRAVENOUS | Status: AC
  Administered 2019-01-14: 1.5 g via INTRAVENOUS
  Filled 2019-01-14: qty 1.5

## 2019-01-14 MED ORDER — DOPAMINE-DEXTROSE 3.2-5 MG/ML-% IV SOLN: 0.0000 ug/kg/min | INTRAVENOUS | Status: DC

## 2019-01-14 MED ORDER — MORPHINE SULFATE (PF) 2 MG/ML IV SOLN
1.0000 mg | INTRAVENOUS | Status: DC | PRN
  Administered 2019-01-14 – 2019-01-15 (×2): 2 mg via INTRAVENOUS
  Filled 2019-01-14 (×2): qty 1

## 2019-01-14 MED ORDER — SODIUM CHLORIDE 0.9 % IV SOLN
1.5000 g | Freq: Two times a day (BID) | INTRAVENOUS | Status: AC
  Administered 2019-01-14 – 2019-01-16 (×4): 1.5 g via INTRAVENOUS
  Filled 2019-01-14 (×4): qty 1.5

## 2019-01-14 MED ORDER — TRANEXAMIC ACID (OHS) PUMP PRIME SOLUTION
2.0000 mg/kg | INTRAVENOUS | Status: DC
  Filled 2019-01-14: qty 1.45

## 2019-01-14 MED ORDER — PANTOPRAZOLE SODIUM 40 MG PO TBEC
40.0000 mg | DELAYED_RELEASE_TABLET | Freq: Every day | ORAL | Status: DC
  Administered 2019-01-16 – 2019-01-18 (×3): 40 mg via ORAL
  Filled 2019-01-14 (×3): qty 1

## 2019-01-14 MED ORDER — INSULIN REGULAR(HUMAN) IN NACL 100-0.9 UT/100ML-% IV SOLN
INTRAVENOUS | Status: AC
  Administered 2019-01-14: .7 [IU]/h via INTRAVENOUS
  Filled 2019-01-14: qty 100

## 2019-01-14 MED ORDER — ACETAMINOPHEN 160 MG/5ML PO SOLN: 650.0000 mg | Freq: Once | ORAL | Status: AC

## 2019-01-14 MED ORDER — ACETAMINOPHEN 160 MG/5ML PO SOLN
1000.0000 mg | Freq: Four times a day (QID) | ORAL | Status: DC
  Administered 2019-01-15: 1000 mg
  Filled 2019-01-14: qty 40.6

## 2019-01-14 MED ORDER — ACETAMINOPHEN 325 MG PO TABS: 650.0000 mg | ORAL_TABLET | ORAL | Status: DC | PRN

## 2019-01-14 MED ORDER — EPINEPHRINE PF 1 MG/ML IJ SOLN
0.0000 ug/min | INTRAVENOUS | Status: DC
  Filled 2019-01-14: qty 4

## 2019-01-14 MED ORDER — ACETAMINOPHEN 650 MG RE SUPP
650.0000 mg | Freq: Once | RECTAL | Status: AC
  Administered 2019-01-14: 650 mg via RECTAL

## 2019-01-14 MED ORDER — ASPIRIN 81 MG PO CHEW: 324.0000 mg | CHEWABLE_TABLET | Freq: Every day | ORAL | Status: DC

## 2019-01-14 MED ORDER — BISACODYL 5 MG PO TBEC
10.0000 mg | DELAYED_RELEASE_TABLET | Freq: Every day | ORAL | Status: DC
  Administered 2019-01-15 – 2019-01-17 (×3): 10 mg via ORAL
  Filled 2019-01-14 (×4): qty 2

## 2019-01-14 MED ORDER — VANCOMYCIN HCL 10 G IV SOLR
1250.0000 mg | INTRAVENOUS | Status: AC
  Administered 2019-01-14: 1250 mg via INTRAVENOUS
  Filled 2019-01-14: qty 1250

## 2019-01-14 MED ORDER — NOREPINEPHRINE-SODIUM CHLORIDE 4-0.9 MG/250ML-% IV SOLN
0.0000 ug/min | INTRAVENOUS | Status: DC
  Filled 2019-01-14: qty 250

## 2019-01-14 SURGICAL SUPPLY — 78 items
BAG DECANTER FOR FLEXI CONT (MISCELLANEOUS) ×4 IMPLANT
BANDAGE ACE 4X5 VEL STRL LF (GAUZE/BANDAGES/DRESSINGS) ×4 IMPLANT
BANDAGE ACE 6X5 VEL STRL LF (GAUZE/BANDAGES/DRESSINGS) ×4 IMPLANT
BANDAGE ELASTIC 4 VELCRO ST LF (GAUZE/BANDAGES/DRESSINGS) ×4 IMPLANT
BANDAGE ELASTIC 6 VELCRO ST LF (GAUZE/BANDAGES/DRESSINGS) ×4 IMPLANT
BLADE MINI RND TIP GREEN BEAV (BLADE) ×4 IMPLANT
BLADE STERNUM SYSTEM 6 (BLADE) ×4 IMPLANT
BLADE SURG 11 STRL SS (BLADE) ×4 IMPLANT
BNDG GAUZE ELAST 4 BULKY (GAUZE/BANDAGES/DRESSINGS) ×4 IMPLANT
CANISTER SUCT 3000ML PPV (MISCELLANEOUS) ×4 IMPLANT
CATH CPB KIT GERHARDT (MISCELLANEOUS) ×4 IMPLANT
CATH THORACIC 28FR (CATHETERS) ×4 IMPLANT
COVER WAND RF STERILE (DRAPES) ×4 IMPLANT
CRADLE DONUT ADULT HEAD (MISCELLANEOUS) ×4 IMPLANT
DERMABOND ADVANCED (GAUZE/BANDAGES/DRESSINGS) ×1
DERMABOND ADVANCED .7 DNX12 (GAUZE/BANDAGES/DRESSINGS) ×3 IMPLANT
DRAIN CHANNEL 28F RND 3/8 FF (WOUND CARE) ×4 IMPLANT
DRAPE CARDIOVASCULAR INCISE (DRAPES) ×1
DRAPE SLUSH/WARMER DISC (DRAPES) ×4 IMPLANT
DRAPE SRG 135X102X78XABS (DRAPES) ×3 IMPLANT
DRSG AQUACEL AG ADV 3.5X14 (GAUZE/BANDAGES/DRESSINGS) ×4 IMPLANT
DRSG SORBAVIEW 2.5X4 SM (GAUZE/BANDAGES/DRESSINGS) ×4 IMPLANT
DRSG TEGADERM 4X4.75 (GAUZE/BANDAGES/DRESSINGS) ×4 IMPLANT
ELECT BLADE 4.0 EZ CLEAN MEGAD (MISCELLANEOUS) ×4
ELECT REM PT RETURN 9FT ADLT (ELECTROSURGICAL) ×8
ELECTRODE BLDE 4.0 EZ CLN MEGD (MISCELLANEOUS) ×3 IMPLANT
ELECTRODE REM PT RTRN 9FT ADLT (ELECTROSURGICAL) ×6 IMPLANT
FELT TEFLON 1X6 (MISCELLANEOUS) ×4 IMPLANT
GAUZE SPONGE 4X4 12PLY STRL (GAUZE/BANDAGES/DRESSINGS) ×8 IMPLANT
GAUZE SPONGE 4X4 12PLY STRL LF (GAUZE/BANDAGES/DRESSINGS) ×8 IMPLANT
GLOVE BIO SURGEON STRL SZ 6.5 (GLOVE) ×12 IMPLANT
GOWN STRL REUS W/ TWL LRG LVL3 (GOWN DISPOSABLE) ×12 IMPLANT
GOWN STRL REUS W/TWL LRG LVL3 (GOWN DISPOSABLE) ×4
HEMOSTAT POWDER SURGIFOAM 1G (HEMOSTASIS) ×12 IMPLANT
HEMOSTAT SURGICEL 2X14 (HEMOSTASIS) ×4 IMPLANT
KIT BASIN OR (CUSTOM PROCEDURE TRAY) ×4 IMPLANT
KIT CATH SUCT 8FR (CATHETERS) ×4 IMPLANT
KIT SUCTION CATH 14FR (SUCTIONS) ×8 IMPLANT
KIT TURNOVER KIT B (KITS) ×4 IMPLANT
KIT VASOVIEW HEMOPRO 2 VH 4000 (KITS) ×4 IMPLANT
LEAD PACING MYOCARDI (MISCELLANEOUS) ×4 IMPLANT
MARKER GRAFT CORONARY BYPASS (MISCELLANEOUS) ×12 IMPLANT
NS IRRIG 1000ML POUR BTL (IV SOLUTION) ×20 IMPLANT
PACK E OPEN HEART (SUTURE) ×4 IMPLANT
PACK OPEN HEART (CUSTOM PROCEDURE TRAY) ×4 IMPLANT
PAD ARMBOARD 7.5X6 YLW CONV (MISCELLANEOUS) ×8 IMPLANT
PAD ELECT DEFIB RADIOL ZOLL (MISCELLANEOUS) ×4 IMPLANT
PENCIL BUTTON HOLSTER BLD 10FT (ELECTRODE) ×4 IMPLANT
PUNCH AORTIC ROTATE  4.5MM 8IN (MISCELLANEOUS) ×4 IMPLANT
SET CARDIOPLEGIA MPS 5001102 (MISCELLANEOUS) ×4 IMPLANT
SOLUTION ANTI FOG 6CC (MISCELLANEOUS) ×4 IMPLANT
SPONGE LAP 18X18 X RAY DECT (DISPOSABLE) ×8 IMPLANT
SURGIFLO W/THROMBIN 8M KIT (HEMOSTASIS) ×4 IMPLANT
SUT BONE WAX W31G (SUTURE) ×4 IMPLANT
SUT MNCRL AB 4-0 PS2 18 (SUTURE) ×4 IMPLANT
SUT PROLENE 3 0 SH1 36 (SUTURE) ×4 IMPLANT
SUT PROLENE 4 0 TF (SUTURE) ×8 IMPLANT
SUT PROLENE 6 0 C 1 30 (SUTURE) ×4 IMPLANT
SUT PROLENE 6 0 CC (SUTURE) ×12 IMPLANT
SUT PROLENE 7 0 BV1 MDA (SUTURE) ×4 IMPLANT
SUT PROLENE 8 0 BV175 6 (SUTURE) ×20 IMPLANT
SUT SILK 2 0 SH CR/8 (SUTURE) ×4 IMPLANT
SUT STEEL 6MS V (SUTURE) ×4 IMPLANT
SUT STEEL SZ 6 DBL 3X14 BALL (SUTURE) ×4 IMPLANT
SUT VIC AB 1 CTX 18 (SUTURE) ×8 IMPLANT
SUT VIC AB 2-0 CT1 27 (SUTURE) ×1
SUT VIC AB 2-0 CT1 TAPERPNT 27 (SUTURE) ×3 IMPLANT
SYSTEM SAHARA CHEST DRAIN ATS (WOUND CARE) ×4 IMPLANT
TAPE CLOTH SURG 4X10 WHT LF (GAUZE/BANDAGES/DRESSINGS) ×8 IMPLANT
TAPE PAPER 2X10 WHT MICROPORE (GAUZE/BANDAGES/DRESSINGS) ×4 IMPLANT
TOWEL GREEN STERILE (TOWEL DISPOSABLE) ×4 IMPLANT
TOWEL GREEN STERILE FF (TOWEL DISPOSABLE) ×4 IMPLANT
TRAY CATH LUMEN 1 20CM STRL (SET/KITS/TRAYS/PACK) ×4 IMPLANT
TRAY FOLEY SLVR 16FR TEMP STAT (SET/KITS/TRAYS/PACK) ×4 IMPLANT
TUBING ART PRESS 48 MALE/FEM (TUBING) ×8 IMPLANT
TUBING INSUFFLATION (TUBING) ×4 IMPLANT
UNDERPAD 30X30 (UNDERPADS AND DIAPERS) ×4 IMPLANT
WATER STERILE IRR 1000ML POUR (IV SOLUTION) ×8 IMPLANT

## 2019-01-14 SURGICAL SUPPLY — 11 items
CATH INFINITI 5FR ANG PIGTAIL (CATHETERS) ×2 IMPLANT
CATH INFINITI 5FR JL4 (CATHETERS) ×2 IMPLANT
CATH INFINITI JR4 5F (CATHETERS) ×2 IMPLANT
DEVICE CLOSURE MYNXGRIP 5F (Vascular Products) ×2 IMPLANT
GLIDESHEATH SLEND SS 6F .021 (SHEATH) IMPLANT
KIT MANI 3VAL PERCEP (MISCELLANEOUS) ×2 IMPLANT
NEEDLE PERC 18GX7CM (NEEDLE) ×2 IMPLANT
PACK CARDIAC CATH (CUSTOM PROCEDURE TRAY) ×2 IMPLANT
SHEATH AVANTI 5FR X 11CM (SHEATH) ×2 IMPLANT
WIRE GUIDERIGHT .035X150 (WIRE) ×2 IMPLANT
WIRE ROSEN-J .035X260CM (WIRE) IMPLANT

## 2019-01-14 NOTE — Progress Notes (Signed)
Pre-op Cardiac Surgery  Carotid Findings:  Right ICA occluded.     Left ICA  1-39%   Upper Extremity Right Left  Brachial Pressures 175 triphasic 160 triphasic  Radial Waveforms triphasic triphasic  Ulnar Waveforms triphasic triphasic  Palmar Arch (Allen's Test) abnormal WNL   Findings:   Palmar arch evaluation: Right obliterates w/ radial compression and remains normal with ulnar.   Left remains normal with both radial and ulnar.    Lower  Extremity Right Left  Dorsalis Pedis 93 biphasic 156 biphasic  Posterior Tibial 96 monphasic 156 triphasic  Ankle/Brachial Indices 0.55 0.89    Findings:   Right ABI indicates moderate arterial flow at rest.   Left  ABI indicates mild arterial flow at rest.   Abram Sander 01/14/2019 1:19 PM

## 2019-01-14 NOTE — OR Nursing (Signed)
Dr Ruthann Cancer pt to be fed at 0930, at 1000 [facility] transport called to say Dr Servando Snare accepting [facility] at [facility] and plans to operate today. Tray removed from pt. But pt had already eaten whole sandwich and had cup of coffee.

## 2019-01-14 NOTE — H&P (Addendum)
[address]       [place] [address]             [phone]        Anthony Morrison [place] Medical Record #[id number] Date of Birth: 13-Dec-1941  Referring: Dr Nehemiah Massed  Primary Care: Rusty Aus, MD Primary Cardiologist:No primary care provider on file.  Chief Complaint:   No chief complaint on file.   History of Present Illness:     Patient is a 78 year old male, currently on no medications.  He had noted a umbilical hernia that was increasingly bothering him, was seen by general surgery and was to have elective repair of his umbilical hernia.  A preop EKG was requested suggesting "flipped T waves".  Due to the findings on his EKG a echo stress test was performed and then this morning a cardiac catheterization done at [facility].  Findings outlined below, but the patient has 99% left main obstruction in addition to three-vessel coronary artery disease.  Although the patient did not present initially with symptoms, he notes that he has been having episodes of right lower chest discomfort and shortness of breath when he exerts himself, he was attributing this to his COPD.  Several years ago he had episode of pneumonia requiring several days hospitalization, and since that time has stopped smoking.   Current Activity/ Functional Status: Patient is independent with mobility/ambulation, transfers, ADL's, IADL's.   Zubrod Score: At the time of surgery this patient's most appropriate activity status/level should be described as: []     0    Normal activity, no symptoms [x]     1    Restricted in physical strenuous activity but ambulatory, able to do out light work []     2    Ambulatory and capable of self care, unable to do work activities, up and about                 more than 50%  Of the time                            []     3    Only limited self care, in bed greater than 50% of waking hours []     4    Completely disabled, no self care, confined to bed or  chair []     5    Moribund  Past Medical History:  Diagnosis Date  . Arthritis   . Bronchitis   . Cancer ([place])    SKIN CANCERS  . COPD (chronic obstructive pulmonary disease) (HCC)    MILD PER PT  . Pneumonia 2016    Past Surgical History:  Procedure Laterality Date  . COLONOSCOPY  2015  . EYE SURGERY     CATARACTS BIL  . LEFT HEART CATH AND CORONARY ANGIOGRAPHY Left 01/14/2019   Procedure: LEFT HEART CATH AND CORONARY ANGIOGRAPHY;  Surgeon: Corey Skains, MD;  Location: [facility];  Service: Cardiovascular;  Laterality: Left;    Social History   Tobacco Use  Smoking Status Former Smoker  . Packs/day: 1.00  . Years: 50.00  . Pack years: 50.00  . Types: Cigarettes  . Last attempt to quit: 01/06/2015  . Years since quitting: 4.0  Smokeless Tobacco Never Used    Social History   Substance and Sexual Activity  Alcohol Use Yes   Comment: OCC      Allergies  Allergen  Reactions  . Sulfa Antibiotics Other (See Comments)    Unknown    Current Facility-Administered Medications  Medication Dose Route Frequency Provider Last Rate Last Dose  . cefUROXime (ZINACEF) 1.5 g in sodium chloride 0.9 % 100 mL IVPB  1.5 g Intravenous To OR Grace Isaac, MD      . cefUROXime (ZINACEF) 750 mg in sodium chloride 0.9 % 100 mL IVPB  750 mg Intravenous To OR Grace Isaac, MD      . dexmedetomidine (PRECEDEX) 400 MCG/100ML (4 mcg/mL) infusion  0.1-0.7 mcg/kg/hr Intravenous To OR Grace Isaac, MD      . DOPamine (INTROPIN) 800 mg in dextrose 5 % 250 mL (3.2 mg/mL) infusion  0-10 mcg/kg/min Intravenous To OR Grace Isaac, MD      . EPINEPHrine (ADRENALIN) 4 mg in dextrose 5 % 250 mL (0.016 mg/mL) infusion  0-10 mcg/min Intravenous To OR Grace Isaac, MD      . heparin 2,500 Units, papaverine 30 mg in electrolyte-148 (PLASMALYTE-148) 500 mL irrigation   Irrigation To OR Grace Isaac, MD      . heparin 30,000 units/NS 1000 mL solution for  CELLSAVER   Other To OR Grace Isaac, MD      . insulin regular, human (MYXREDLIN) 100 units/ 100 mL infusion   Intravenous To OR Grace Isaac, MD      . magnesium sulfate (IV Push/IM) injection 40 mEq  40 mEq Other To OR Grace Isaac, MD      . milrinone (PRIMACOR) 20 MG/100 ML (0.2 mg/mL) infusion  0.3 mcg/kg/min Intravenous To OR Grace Isaac, MD      . nitroGLYCERIN 50 mg in dextrose 5 % 250 mL (0.2 mg/mL) infusion  2-200 mcg/min Intravenous To OR Grace Isaac, MD      . norepinephrine (LEVOPHED) 4mg  in NS 284mL premix infusion  0-40 mcg/min Intravenous To OR Grace Isaac, MD      . phenylephrine (NEOSYNEPHRINE) 20-0.9 MG/250ML-% infusion  30-200 mcg/min Intravenous To OR Grace Isaac, MD      . potassium chloride injection 80 mEq  80 mEq Other To OR Grace Isaac, MD      . tranexamic acid (CYKLOKAPRON) 2,500 mg in sodium chloride 0.9 % 250 mL (10 mg/mL) infusion  1.5 mg/kg/hr Intravenous To OR Grace Isaac, MD      . tranexamic acid (CYKLOKAPRON) bolus via infusion - over 30 minutes 1,089 mg  15 mg/kg Intravenous To OR Grace Isaac, MD      . tranexamic acid (CYKLOKAPRON) pump prime solution 145 mg  2 mg/kg Intracatheter To OR Grace Isaac, MD      . vancomycin (VANCOCIN) 1,250 mg in sodium chloride 0.9 % 250 mL IVPB  1,250 mg Intravenous To OR Grace Isaac, MD        Medications Prior to Admission  Medication Sig Dispense Refill Last Dose  . albuterol (PROVENTIL HFA;VENTOLIN HFA) 108 (90 Base) MCG/ACT inhaler Inhale 1 puff into the lungs every 6 (six) hours as needed for wheezing or shortness of breath. (Patient not taking: Reported on 12/28/2018) 1 Inhaler 1 Not Taking at Unknown time  . etodolac (LODINE) 400 MG tablet Take 400 mg by mouth daily as needed for moderate pain.   Not Taking at Unknown time  . Fluticasone-Salmeterol (ADVAIR DISKUS) 250-50 MCG/DOSE AEPB Inhale 1 puff into the lungs 2 times daily at 12 noon and 4  pm. (Patient not  taking: Reported on 12/28/2018) 1 each 1 Not Taking at Unknown time  . oxymetazoline (AFRIN) 0.05 % nasal spray Place 1 spray into both nostrils at bedtime.   01/13/2019 at Unknown time  . predniSONE (DELTASONE) 10 MG tablet Take 50 mg daily then taper by 10 mg daily and stop (Patient not taking: Reported on 12/28/2018) 15 tablet 0 Not Taking at Unknown time  . vitamin B-12 (CYANOCOBALAMIN) 1000 MCG tablet Take 5,000 mcg by mouth once a week.    Past Week at Unknown time    History reviewed. No pertinent family history.   Review of Systems:  Pertinent items are noted in HPI.     Cardiac Review of Systems: Y or  [    ]= no  Chest Pain [  y  ]  Resting SOB [  n ] Exertional SOB  Blue.Reese  ]  Orthopnea [ n ]   Pedal Edema [ n  ]    Palpitations [n  ] Syncope  [ n ]   Presyncope [ n  ]  General Review of Systems: [Y] = yes [  ]=no Constitional: recent weight change [  ]; anorexia [  ]; fatigue [  ]; nausea [  ]; night sweats [  ]; fever [  ]; or chills [  ]                                                               Dental: Last Dentist visit:   Eye : blurred vision [  ]; diplopia [   ]; vision changes [  ];  Amaurosis fugax[  ]; Resp: cough [  ];  wheezing[  ];  hemoptysis[  ]; shortness of breath[  ]; paroxysmal nocturnal dyspnea[  ]; dyspnea on exertion[y  ]; or orthopnea[  ];  GI:  gallstones[  ], vomiting[  ];  dysphagia[  ]; melena[  ];  hematochezia [  ]; heartburn[  ];   Hx of  Colonoscopy[  ]; GU: kidney stones [  ]; hematuria[  ];   dysuria [  ];  nocturia[  ];  history of     obstruction [  ]; urinary frequency [  ]             Skin: rash, swelling[  ];, hair loss[  ];  peripheral edema[  ];  or itching[  ]; Musculosketetal: myalgias[  ];  joint swelling[  ];  joint erythema[  ];  joint pain[  ];  back pain[  ];  Heme/Lymph: bruising[  ];  bleeding[  ];  anemia[  ];  Neuro: TIA[  ];  headaches[  ];  stroke[  ];  vertigo[  ];  seizures[  ];   paresthesias[  ];  difficulty  walking[  ];  Psych:depression[  ]; anxiety[  ];  Endocrine: diabetes[ n ];  thyroid dysfunction[  n];            Physical Exam: Ht 5\' 6"  (1.676 m)   Wt 72.6 kg   BMI 25.82 kg/m    General appearance: alert, cooperative and no distress Head: Normocephalic, without obvious abnormality, atraumatic Neck: no adenopathy, no carotid bruit, no JVD, supple, symmetrical, trachea midline and thyroid not enlarged, symmetric, no tenderness/mass/nodules Lymph nodes: Cervical, supraclavicular, and  axillary nodes normal. 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  Mid LM lesion is 95% stenosed.  Ost LM lesion is 50% stenosed.  Prox LAD lesion is 40% stenosed.  Prox RCA lesion is 60% stenosed.  78 year old male with essential hypertension mixed hyperlipidemia having progressive shortness of breath and abnormal stress test consistent with diffuse coronary artery disease  LV  systolic function normal with ejection fraction of 60%  Critical distal left main coronary atherosclerosis of 95% Critical ostial right coronary artery stenosis of 85%  Plan Transfer for coronary artery bypass surgery due to critical left main and ostial right coronary artery stenosis Antiplatelet medication management with aspirin High intensity cholesterol therapy Cardiac rehabilitation after above  Stress ECHO: 01/10/2019                             Footman, Konstantin R, JR.      [facility]                  [id number]      [place] #: [id number]      [place], [place], [place] [address]   Date: 01/10/2019 01: 06 PM                                Adult  Male Age: 73 yrs      ECHOCARDIOGRAM REPORT               Outpatient                                KC^^KCWI    STUDY:Stress Echo       TAPE:          MD1: MILLER, MARK FREDERIC    ECHO:Yes  DOPPLER:Yes    FILE:0000-000-000    BP: 173/76 mmHg    COLOR:Yes  CONTRAST:No   MACHINE:Philips  RV BIOPSY:No     3D:No SOUND QLTY:Moderate      Height: 66 in   MEDIUM:None                       Weight: 161 lb  BSA: 1.8 m2 _________________________________________________________________________________________        HISTORY: Chest pain         REASON: Assess, LV function       Indication: Abnormal EKG [R94.31 (ICD-10-CM)] _________________________________________________________________________________________ STRESS ECHOCARDIOGRAPHY        Protocol: Treadmill (Modified Bruce)         Drugs: None       Target HR: 122 bpm      Maximum Predicted HR: 143  bpm +----------------------------------------------------------------------------+ :Stage      Duration           HR     BP         : :  RESTING   :              :82    :173/76       : :---------------+----------------------------+----------+---------+     : : EXERCISE   :2:26            :129    :/          : :---------------+----------------------------+----------+---------+     : : RECOVERY   :5:50            :74    :175/75       : :---------------+----------------------------+----------+---------+     : +----------------------------------------------------------------------------+    Stress Duration: 2: 26 mm: ss    Max Stress H.R.: 129 bpm       Target HR Achieved: Yes Maximum workload of 6.50 METS was achieved during exercise _________________________________________________________________________________________ WALL SEGMENT CHANGES             Rest        Stress Anterior Septum Basal: Normal       HYPOKINETIC          Mid: Normal       HYPOKINETIC         Apical: Normal       Hyperkinetic  Anterior Wall Basal: Normal       HYPOKINETIC          Mid: Normal       HYPOKINETIC         Apical: Normal       Hyperkinetic   Lateral Wall Basal: Normal       Hyperkinetic          Mid: Normal       HYPOKINETIC         Apical: Normal       HYPOKINETIC  Posterior Wall Basal: Normal       Hyperkinetic          Mid: Normal       Hyperkinetic  Inferior Wall Basal: Normal       Hyperkinetic          Mid: Normal       Hyperkinetic         Apical: Normal       Hyperkinetic Inferior Septum Basal: Normal       Hyperkinetic          Mid: Normal        Hyperkinetic       Resting EF: >55% (Est.)         Stress EF: >55% (Est.)  _________________________________________________________________________________________ ADDITIONAL FINDINGS _________________________________________________________________________________________ STRESS ECG RESULTS      ECG Results: POSITIVE _________________________________________________________________________________________ ECHOCARDIOGRAPHIC DESCRIPTIONS LEFT VENTRICLE          Size: Normal      Contraction: Normal  LV Masses: No Masses          LVH: None      Dias.FxClass: Normal RIGHT VENTRICLE          Size: Normal            Free Wall: Normal      Contraction: Normal            RV Masses: No mass PERICARDIUM         Fluid: No effusion _________________________________________________________________________________________  DOPPLER ECHO and OTHER SPECIAL PROCEDURES         Aortic: TRIVIAL AR         No AS         Mitral: MILD MR          No MS             MV Inflow E Vel = nm*   MV Annulus E'Vel = nm*             E/E'Ratio = nm*       Tricuspid: MILD TR          No TS             289.1 cm/sec peak TR vel       Pulmonary: TRIVIAL PR         No PS _________________________________________________________________________________________ ECHOCARDIOGRAPHIC MEASUREMENTS 2D DIMENSIONS AORTA         Values  Normal Range  MAIN PA     Values  Normal Range        Annulus: nm*   [2.3-2.9]       PA Main: nm*    [1.5-2.1]       Aorta Sin: nm*   [3.1-3.7]  RIGHT VENTRICLE      ST Junction: nm*   [2.6-3.2]       RV Base: nm*    [<4.2]       Asc.Aorta: nm*   [2.6-3.4]        RV Mid: nm*    [<3.5] LEFT VENTRICLE                    RV Length: nm*    [<8.6]         LVIDd: nm*   [4.2-5.9]  INFERIOR VENA CAVA         LVIDs: nm*              Max. IVC: nm*    [<=2.1]           FS: nm*   [>25]         Min. IVC: nm*          SWT: nm*   [0.6-1.0]  ------------------          PWT: nm*   [0.6-1.0]  nm* - not measured LEFT ATRIUM        LA Diam: nm*   [3.0-4.0]      LA A4C Area: nm*   [<20]       LA Volume: nm*   [18-58] _________________________________________________________________________________________ INTERPRETATION ABNORMAL STRESS ECHOCARDIOGRAM NORMAL RIGHT VENTRICULAR SYSTOLIC FUNCTION MILD VALVULAR REGURGITATION (See above) NO VALVULAR STENOSIS NOTED _________________________________________________________________________________________ Electronically signed by   Rusty Aus, MD on 01/10/2019 06: 09 PM      Performed By: Scherrie November, RCS   Ordering Physician: [facility] _________________________________________________________________________________________  Other Result Information  Interface, Text Results In - 01/11/2019 12:09 PM EST  Niu, Niklas R, JR.           [facility]                                    [id number]           [place] #: [id number]           [address] [place],  [place] [address]      Date: 01/10/2019 01: 06 PM                                                              Adult   Male  Age: 54 yrs           ECHOCARDIOGRAM REPORT                              Outpatient                                                              KC^^KCWI      STUDY:Stress Echo              TAPE:                    MD1: MILLER, MARK FREDERIC       ECHO:Yes    DOPPLER:Yes       FILE:0000-000-000        BP: 173/76 mmHg      COLOR:Yes    CONTRAST:No     MACHINE:Philips  RV BIOPSY:No          3D:No  SOUND QLTY:Moderate            Height: 66 in     MEDIUM:None                                              Weight: 161 lb                                                              BSA: 1.8 m2 _________________________________________________________________________________________               HISTORY: Chest pain                REASON: Assess, LV function            Indication: Abnormal EKG [R94.31 (ICD-10-CM)] _________________________________________________________________________________________ STRESS ECHOCARDIOGRAPHY              Protocol:  Treadmill (Modified Bruce)                 Drugs: None             Target HR: 122 bpm           Maximum Predicted HR: 143 bpm +----------------------------------------------------------------------------+ :Stage           Duration                     HR         BP                  : :   RESTING     :                            :82        :173/76              : :---------------+----------------------------+----------+---------+          : :  EXERCISE     :2:26                        :129       :/                   : :---------------+----------------------------+----------+---------+          : :  RECOVERY     :5:50                        :74        :175/75              : :---------------+----------------------------+----------+---------+          : +----------------------------------------------------------------------------+       Stress Duration: 2: 26 mm: ss       Max Stress H.R.: 129 bpm             Target HR Achieved: Yes Maximum workload of 6.50 METS was achieved during exercise _________________________________________________________________________________________ WALL SEGMENT CHANGES                        Rest                Stress Anterior Septum Basal: Normal              HYPOKINETIC                   Mid: Normal              HYPOKINETIC                Apical: Normal               Hyperkinetic   Anterior Wall Basal: Normal              HYPOKINETIC                   Mid: Normal              HYPOKINETIC                Apical: Normal              Hyperkinetic    Lateral Wall Basal: Normal              Hyperkinetic  Mid: Normal              HYPOKINETIC                Apical: Normal              HYPOKINETIC  Posterior Wall Basal: Normal              Hyperkinetic                   Mid: Normal              Hyperkinetic   Inferior Wall Basal: Normal              Hyperkinetic                   Mid: Normal              Hyperkinetic                Apical: Normal              Hyperkinetic Inferior Septum Basal: Normal              Hyperkinetic                   Mid: Normal              Hyperkinetic            Resting EF: >55% (Est.)                  Stress EF: >55% (Est.)  _________________________________________________________________________________________ ADDITIONAL FINDINGS _________________________________________________________________________________________ STRESS ECG RESULTS           ECG Results: POSITIVE _________________________________________________________________________________________ ECHOCARDIOGRAPHIC DESCRIPTIONS LEFT VENTRICLE                  Size: Normal           Contraction: Normal             LV Masses: No Masses                   LVH: None          Dias.FxClass: Normal RIGHT VENTRICLE                  Size: Normal                       Free Wall: Normal           Contraction: Normal                       RV Masses: No mass PERICARDIUM                 Fluid: No effusion _________________________________________________________________________________________  DOPPLER ECHO and OTHER SPECIAL PROCEDURES                Aortic: TRIVIAL AR                 No AS                Mitral: MILD MR                    No MS                        MV Inflow E Vel = nm*      MV Annulus  E'Vel = nm*                         E/E'Ratio = nm*             Tricuspid: MILD TR                    No TS                        289.1 cm/sec peak TR vel             Pulmonary: TRIVIAL PR                 No PS _________________________________________________________________________________________ ECHOCARDIOGRAPHIC MEASUREMENTS 2D DIMENSIONS AORTA                  Values   Normal Range   MAIN PA         Values    Normal Range               Annulus: nm*     [2.3-2.9]              PA Main: nm*       [1.5-2.1]             Aorta Sin: nm*     [3.1-3.7]    RIGHT VENTRICLE           ST Junction: nm*     [2.6-3.2]              RV Base: nm*       [<4.2]             Asc.Aorta: nm*     [2.6-3.4]               RV Mid: nm*       [<3.5] LEFT VENTRICLE                                      RV Length: nm*       [<8.6]                 LVIDd: nm*     [4.2-5.9]    INFERIOR VENA CAVA                 LVIDs: nm*                           Max. IVC: nm*       [<=2.1]                    FS: nm*     [>25]                 Min. IVC: nm*                   SWT: nm*     [0.6-1.0]    ------------------                   PWT: nm*     [0.6-1.0]    nm* - not measured LEFT ATRIUM               LA Diam: nm*     [3.0-4.0]           LA A4C  Area: nm*     [<20]             LA Volume: nm*     [18-58] _________________________________________________________________________________________ INTERPRETATION ABNORMAL STRESS ECHOCARDIOGRAM NORMAL RIGHT VENTRICULAR SYSTOLIC FUNCTION MILD VALVULAR REGURGITATION (See above) NO VALVULAR STENOSIS NOTED _________________________________________________________________________________________ Electronically signed by      Rusty Aus, MD on 01/10/2019 06: 09 PM          Performed By: Scherrie November, RCS    Ordering Physician: [facility] _________________________________________________________________________________________    Assessment / Plan:    Patient with symptomatic, critical left main disease  greater than 98% stenosis with three-vessel disease and preserved LV function.  With the patient's critical anatomy proceeding with coronary artery bypass grafting today is indicated.  Patient has normal renal function.    Risks and options of surgery were discussed with the patient his wife and son. The goals risks and alternatives of the planned surgical procedure Procedure(s): CORONARY ARTERY BYPASS GRAFTING (CABG) with endovein (N/A) TRANSESOPHAGEAL ECHOCARDIOGRAM (TEE) (N/A)  have been discussed with the patient in detail. The risks of the procedure including death, infection, stroke, myocardial infarction, bleeding, blood transfusion have all been discussed specifically.  I have quoted Anthony Morrison a 2 % of perioperative mortality and a complication rate as high as 30 %. The patient's questions have been answered.Anthony Morrison is willing  to proceed with the planned procedure.  Patient was given part of a sandwich about 930, I discussed this with Dr. Verdie Drown and the time it took to get the patient transferred from [place] and proceed to surgery the benefit of delaying surgery until tomorrow is not warranted considering the patient's critical anatomy.  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

## 2019-01-14 NOTE — Anesthesia Preprocedure Evaluation (Addendum)
Anesthesia Evaluation  Patient identified by MRN, date of birth, ID band Patient awake    Reviewed: Allergy & Precautions, NPO status , Patient's Chart, lab work & pertinent test results  Airway Mallampati: II  TM Distance: >3 FB Neck ROM: Full    Dental  (+) Edentulous Upper   Pulmonary former smoker,    breath sounds clear to auscultation       Cardiovascular  Rhythm:Regular Rate:Normal     Neuro/Psych    GI/Hepatic   Endo/Other    Renal/GU      Musculoskeletal   Abdominal   Peds  Hematology   Anesthesia Other Findings   Reproductive/Obstetrics                             Anesthesia Physical Anesthesia Plan  ASA: IV and emergent  Anesthesia Plan: General   Post-op Pain Management:    Induction: Intravenous  PONV Risk Score and Plan: Ondansetron and Dexamethasone  Airway Management Planned: Oral ETT  Additional Equipment: Arterial line, CVP, PA Cath, 3D TEE and Ultrasound Guidance Line Placement  Intra-op Plan:   Post-operative Plan: Post-operative intubation/ventilation  Informed Consent: I have reviewed the patients History and Physical, chart, labs and discussed the procedure including the risks, benefits and alternatives for the proposed anesthesia with the patient or authorized representative who has indicated his/her understanding and acceptance.     Dental advisory given  Plan Discussed with: CRNA and Anesthesiologist  Anesthesia Plan Comments:         Anesthesia Quick Evaluation

## 2019-01-14 NOTE — Anesthesia Procedure Notes (Signed)
Central Venous Catheter Insertion Performed by: Roberts Gaudy, MD, anesthesiologist Start/End1/24/2020 1:15 PM, 01/14/2019 1:25 PM Patient location: Pre-op. Preanesthetic checklist: patient identified, IV checked, site marked, risks and benefits discussed, surgical consent, monitors and equipment checked, pre-op evaluation, timeout performed and anesthesia consent Lidocaine 1% used for infiltration and patient sedated Hand hygiene performed  and maximum sterile barriers used  Catheter size: 9 Fr Sheath introducer Procedure performed using ultrasound guided technique. Ultrasound Notes:anatomy identified, needle tip was noted to be adjacent to the nerve/plexus identified, no ultrasound evidence of intravascular and/or intraneural injection and image(s) printed for medical record Attempts: 1 Following insertion, line sutured and dressing applied. Post procedure assessment: blood return through all ports, free fluid flow and no air  Patient tolerated the procedure well with no immediate complications.

## 2019-01-14 NOTE — Anesthesia Procedure Notes (Signed)
Arterial Line Insertion Start/End1/24/2020 1:21 PM, 01/14/2019 1:31 PM Performed by: Moshe Salisbury, CRNA, CRNA  Patient location: Pre-op. Preanesthetic checklist: patient identified, IV checked, site marked, risks and benefits discussed, surgical consent, monitors and equipment checked, pre-op evaluation, timeout performed and anesthesia consent Lidocaine 1% used for infiltration and patient sedated Right, radial was placed Catheter size: 20 G Hand hygiene performed  and maximum sterile barriers used  Allen's test indicative of satisfactory collateral circulation Attempts: 1 Procedure performed without using ultrasound guided technique. Ultrasound Notes:anatomy identified Following insertion, dressing applied and Biopatch. Post procedure assessment: normal  Patient tolerated the procedure well with no immediate complications.

## 2019-01-14 NOTE — Progress Notes (Signed)
[facility] Encounter Note  Patient: Anthony Morrison / Admit Date: 01/14/2019 / Date of Encounter: 01/14/2019, 8:31 AM   Subjective: 78 year old male with hypertension hyperlipidemia and chest pressure and shortness of breath with and without physical activity increasing in nature over the last several weeks with an abnormal stress test consistent with multivessel coronary artery disease. 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 rate 13, height 5\' 6"  (1.676 m), weight 72.6 kg, SpO2 94 %. Body mass index is 25.82 kg/m. General: Well developed, well nourished, in no acute distress. Head: Normocephalic, atraumatic, sclera non-icteric, no xanthomas, nares are without discharge. Neck: No apparent masses Lungs: Normal respirations with no wheezes, no rhonchi, no rales , no crackles   Heart: Regular rate and rhythm, normal S1 S2, no murmur, no rub, no gallop, PMI is normal size and placement, carotid upstroke normal without bruit, jugular venous pressure normal Abdomen: Soft, non-tender, non-distended with normoactive bowel sounds. No hepatosplenomegaly. Abdominal aorta is normal size without bruit Extremities: No edema, no clubbing, no cyanosis, no ulcers,  Peripheral: 2+ radial, 2+  femoral, 2+ dorsal pedal pulses Neuro: Alert and oriented. Moves all extremities spontaneously. Psych:  Responds to questions appropriately with a normal affect.  No intake or output data in the 24 hours ending 01/14/19 0831  Inpatient Medications:  . aspirin      . sodium chloride flush  3 mL Intravenous Q12H   Infusions:  . sodium chloride    . sodium chloride      Labs: No results for input(s): NA, K, CL, CO2, GLUCOSE, BUN, CREATININE, CALCIUM, MG, PHOS in the last 72 hours. No results for input(s): AST, ALT, ALKPHOS, BILITOT, PROT, ALBUMIN in the last 72 hours. No results for input(s): WBC, NEUTROABS, HGB, HCT, MCV, PLT in the last 72 hours. No results for input(s): CKTOTAL, CKMB, TROPONINI in the last 72 hours. Invalid input(s): POCBNP No results for input(s): HGBA1C in the last 72 hours.   Weights: Filed Weights   01/14/19 0642  Weight: 72.6 kg     Radiology/Studies:  No results found.   Assessment and Recommendation  78 y.o. male with critical distal left main and ostial right coronary artery atherosclerosis 1.  Topical nitrate 2.  Aspirin 3.  High intensity cholesterol therapy 4.  Transfer for coronary artery bypass surgery  Signed, Serafina Royals M.D. FACC

## 2019-01-14 NOTE — Anesthesia Procedure Notes (Signed)
Procedure Name: Intubation Date/Time: 01/14/2019 2:23 PM Performed by: Inda Coke, CRNA Pre-anesthesia Checklist: Patient identified, Emergency Drugs available, Suction available and Patient being monitored Patient Re-evaluated:Patient Re-evaluated prior to induction Oxygen Delivery Method: Circle System Utilized Preoxygenation: Pre-oxygenation with 100% oxygen Induction Type: IV induction, Rapid sequence and Cricoid Pressure applied Ventilation: Mask ventilation without difficulty Laryngoscope Size: Mac and 4 Grade View: Grade I Tube type: Oral Tube size: 8.0 mm Number of attempts: 1 Airway Equipment and Method: Stylet and Oral airway Placement Confirmation: ETT inserted through vocal cords under direct vision,  positive ETCO2 and breath sounds checked- equal and bilateral Secured at: 22 cm Tube secured with: Tape Dental Injury: Teeth and Oropharynx as per pre-operative assessment

## 2019-01-14 NOTE — Anesthesia Procedure Notes (Signed)
Central Venous Catheter Insertion Performed by: Roberts Gaudy, MD, anesthesiologist Start/End1/24/2020 1:15 PM, 01/14/2019 1:25 PM Patient location: Pre-op. Preanesthetic checklist: patient identified, IV checked, site marked, risks and benefits discussed, surgical consent, monitors and equipment checked, pre-op evaluation, timeout performed and anesthesia consent Hand hygiene performed  and maximum sterile barriers used  PA cath was placed.Swan type:thermodilution Procedure performed without using ultrasound guided technique. Ultrasound Notes:anatomy identified, needle tip was noted to be adjacent to the nerve/plexus identified, no ultrasound evidence of intravascular and/or intraneural injection and image(s) printed for medical record Attempts: 1 Following insertion, line sutured, dressing applied and Biopatch. Patient tolerated the procedure well with no immediate complications.

## 2019-01-14 NOTE — Anesthesia Postprocedure Evaluation (Signed)
Anesthesia Post Note  Patient: Adarius Tigges Bonura  Procedure(s) Performed: Emergency Coronary Artery Bypass Grafting (CABG)  times three using left internal mammary artery to LAD, reversed saphenous vein graft to OM and RCA (N/A Chest) TRANSESOPHAGEAL ECHOCARDIOGRAM (TEE) (N/A ) ENDOVEIN HARVEST OF GREATER SAPHENOUS VEIN (Right Leg Upper)     Patient location during evaluation: SICU Anesthesia Type: General Level of consciousness: sedated Pain management: pain level controlled Vital Signs Assessment: post-procedure vital signs reviewed and stable Respiratory status: patient remains intubated per anesthesia plan Cardiovascular status: stable Postop Assessment: no apparent nausea or vomiting Anesthetic complications: no    Last Vitals:  Vitals:   01/14/19 2145 01/14/19 2200  BP: 113/72 95/69  Pulse: 89 89  Resp: 14 14  Temp: (!) 35.9 C (!) 36.1 C  SpO2: 100% 100%    Last Pain:  Vitals:   01/14/19 2100  TempSrc: Core                 Derrian Rodak,W. EDMOND

## 2019-01-14 NOTE — Brief Op Note (Signed)
      [address]       [place] [address]             [phone]      01/14/2019  8:21 PM  PATIENT:  Anthony Morrison  78 y.o. male  PRE-OPERATIVE DIAGNOSIS:  critical left main   POST-OPERATIVE DIAGNOSIS:  same   PROCEDURE:  Procedure(s):  Emergency CORONARY ARTERY BYPASS GRAFTING x 3 -LIMA to LAD -SVG to LEFT CIRCUMFLEX -SVG to RCA  ENDOVEIN HARVEST OF GREATER SAPHENOUS VEIN  -Right Thigh to Below the Knee  TRANSESOPHAGEAL ECHOCARDIOGRAM (TEE) (N/A)   SURGEON:  Surgeon(s) and Role:    * Grace Isaac, MD - Primary  PHYSICIAN ASSISTANT: Ellwood Handler PA-C  ANESTHESIA:   general  EBL:  700  BLOOD ADMINISTERED:CELLSAVER  DRAINS: Left Pleural Chest Tubes, Mediastinal Chest Drains   LOCAL MEDICATIONS USED:  NONE  SPECIMEN:  No Specimen  DISPOSITION OF SPECIMEN:  N/A  COUNTS:  YES  DICTATION: .Dragon Dictation  PLAN OF CARE: Admit to inpatient   PATIENT DISPOSITION:  ICU - intubated and hemodynamically stable.   Delay start of Pharmacological VTE agent (>24hrs) due to surgical blood loss or risk of bleeding: yes

## 2019-01-14 NOTE — Progress Notes (Signed)
Patient ID: Anthony Morrison, male   DOB: [birth year], 78 y.o.   MRN: [id number] EVENING ROUNDS NOTE :     [address]       [place] [address]             [phone]                 Day of Surgery Procedure(s) (LRB): Emergency Coronary Artery Bypass Grafting (CABG)  times three using left internal mammary artery to LAD, reversed saphenous vein graft to OM and RCA (N/A) TRANSESOPHAGEAL ECHOCARDIOGRAM (TEE) (N/A) ENDOVEIN HARVEST OF GREATER SAPHENOUS VEIN (Right)  Total Length of Stay:  LOS: 0 days  BP (!) 76/47   Pulse 88   Resp 12   Ht 5\' 6"  (1.676 m)   Wt 72.6 kg   SpO2 99%   BMI 25.82 kg/m   .Intake/Output      01/24 0701 - 01/25 0700   I.V. (mL/kg) 2300 (31.7)   Blood 660   IV Piggyback 250   Total Intake(mL/kg) 3210 (44.2)   Urine (mL/kg/hr) 1925   Total Output 1925   Net +1285         . sodium chloride    . [START ON 01/15/2019] sodium chloride    . sodium chloride    . albumin human    . cefUROXime (ZINACEF)  IV    . dexmedetomidine (PRECEDEX) IV infusion    . famotidine (PEPCID) IV    . insulin    . lactated ringers    . lactated ringers    . lactated ringers    . magnesium sulfate    . nitroGLYCERIN    . phenylephrine (NEO-SYNEPHRINE) Adult infusion    . potassium chloride    . [START ON 01/15/2019] vancomycin       Lab Results  Component Value Date   WBC 6.6 01/14/2019   HGB 8.2 (L) 01/14/2019   HCT 24.0 (L) 01/14/2019   PLT 127 (L) 01/14/2019   GLUCOSE 113 (H) 01/14/2019   ALT 19 01/14/2019   AST 18 01/14/2019   NA 141 01/14/2019   K 3.8 01/14/2019   CL 110 01/14/2019   CREATININE 1.11 01/14/2019   BUN 12 01/14/2019   CO2 25 01/14/2019   INR 1.11 01/14/2019   Stable early post op Not bleeding Cardiac index 2.0    Grace Isaac MD  Beeper [phone] Office [phone] 01/14/2019 8:38 PM

## 2019-01-14 NOTE — Progress Notes (Signed)
Wife, Jeromy Borcherding given patient's ring.  Son, Renato Gails and wife escorted to lobby.  Can be reached at [phone].

## 2019-01-14 NOTE — Transfer of Care (Signed)
Immediate Anesthesia Transfer of Care Note  Patient: Anthony Morrison  Procedure(s) Performed: Emergency Coronary Artery Bypass Grafting (CABG)  times three using left internal mammary artery to LAD, reversed saphenous vein graft to OM and RCA (N/A Chest) TRANSESOPHAGEAL ECHOCARDIOGRAM (TEE) (N/A ) ENDOVEIN HARVEST OF GREATER SAPHENOUS VEIN (Right Leg Upper)  Patient Location: SICU  Anesthesia Type:General  Level of Consciousness: sedated and responds to stimulation  Airway & Oxygen Therapy: Patient remains intubated per anesthesia plan and Patient placed on Ventilator (see vital sign flow sheet for setting)  Post-op Assessment: Report given to RN and Post -op Vital signs reviewed and stable  Post vital signs: Reviewed and stable  Last Vitals:  Vitals Value Taken Time  BP 69/51 01/14/2019  8:15 PM  Temp 35.6 C 01/14/2019  8:20 PM  Pulse    Resp 12 01/14/2019  8:20 PM  SpO2    Vitals shown include unvalidated device data.  Last Pain: There were no vitals filed for this visit.       Complications: No apparent anesthesia complications

## 2019-01-15 ENCOUNTER — Inpatient Hospital Stay (HOSPITAL_COMMUNITY): Payer: PPO

## 2019-01-15 LAB — BASIC METABOLIC PANEL
Anion gap: 5 (ref 5–15)
Anion gap: 5 (ref 5–15)
BUN: 8 mg/dL (ref 8–23)
BUN: 8 mg/dL (ref 8–23)
CHLORIDE: 110 mmol/L (ref 98–111)
CO2: 22 mmol/L (ref 22–32)
CO2: 24 mmol/L (ref 22–32)
Calcium: 6.9 mg/dL — ABNORMAL LOW (ref 8.9–10.3)
Calcium: 7.3 mg/dL — ABNORMAL LOW (ref 8.9–10.3)
Chloride: 112 mmol/L — ABNORMAL HIGH (ref 98–111)
Creatinine, Ser: 1.01 mg/dL (ref 0.61–1.24)
Creatinine, Ser: 1.21 mg/dL (ref 0.61–1.24)
GFR calc Af Amer: >60 mL/min (ref >60)
GFR calc Af Amer: >60 mL/min (ref >60)
GFR calc non Af Amer: >60 mL/min (ref >60)
GFR calc non Af Amer: 57 mL/min — ABNORMAL LOW (ref >60)
Glucose, Bld: 149 mg/dL — ABNORMAL HIGH (ref 70–99)
Glucose, Bld: 129 mg/dL — ABNORMAL HIGH (ref 70–99)
Potassium: 4.1 mmol/L (ref 3.5–5.1)
Potassium: 4.1 mmol/L (ref 3.5–5.1)
Sodium: 139 mmol/L (ref 135–145)
Sodium: 139 mmol/L (ref 135–145)

## 2019-01-15 LAB — POCT I-STAT 7, (LYTES, BLD GAS, ICA,H+H)
Acid-base deficit: 4 mmol/L — ABNORMAL HIGH (ref 0.0–2.0)
Acid-base deficit: 3 mmol/L — ABNORMAL HIGH (ref 0.0–2.0)
Bicarbonate: 22.4 mmol/L (ref 20.0–28.0)
Bicarbonate: 23.6 mmol/L (ref 20.0–28.0)
Calcium, Ion: 1.07 mmol/L — ABNORMAL LOW (ref 1.15–1.40)
Calcium, Ion: 1.06 mmol/L — ABNORMAL LOW (ref 1.15–1.40)
HCT: 29 % — ABNORMAL LOW (ref 39.0–52.0)
HCT: 31 % — ABNORMAL LOW (ref 39.0–52.0)
Hemoglobin: 9.9 g/dL — ABNORMAL LOW (ref 13.0–17.0)
Hemoglobin: 10.5 g/dL — ABNORMAL LOW (ref 13.0–17.0)
O2 Saturation: 98 %
O2 Saturation: 99 %
Patient temperature: 36.8
Patient temperature: 37.3
Potassium: 4.2 mmol/L (ref 3.5–5.1)
Potassium: 4.2 mmol/L (ref 3.5–5.1)
Sodium: 141 mmol/L (ref 135–145)
Sodium: 142 mmol/L (ref 135–145)
TCO2: 24 mmol/L (ref 22–32)
TCO2: 25 mmol/L (ref 22–32)
pCO2 arterial: 45.9 mmHg (ref 32.0–48.0)
pCO2 arterial: 48.1 mmHg — ABNORMAL HIGH (ref 32.0–48.0)
pH, Arterial: 7.295 — ABNORMAL LOW (ref 7.350–7.450)
pH, Arterial: 7.301 — ABNORMAL LOW (ref 7.350–7.450)
pO2, Arterial: 117 mmHg — ABNORMAL HIGH (ref 83.0–108.0)
pO2, Arterial: 147 mmHg — ABNORMAL HIGH (ref 83.0–108.0)

## 2019-01-15 LAB — GLUCOSE, CAPILLARY
Glucose-Capillary: 101 mg/dL — ABNORMAL HIGH (ref 70–99)
Glucose-Capillary: 102 mg/dL — ABNORMAL HIGH (ref 70–99)
Glucose-Capillary: 104 mg/dL — ABNORMAL HIGH (ref 70–99)
Glucose-Capillary: 107 mg/dL — ABNORMAL HIGH (ref 70–99)
Glucose-Capillary: 111 mg/dL — ABNORMAL HIGH (ref 70–99)
Glucose-Capillary: 114 mg/dL — ABNORMAL HIGH (ref 70–99)
Glucose-Capillary: 114 mg/dL — ABNORMAL HIGH (ref 70–99)
Glucose-Capillary: 116 mg/dL — ABNORMAL HIGH (ref 70–99)
Glucose-Capillary: 116 mg/dL — ABNORMAL HIGH (ref 70–99)
Glucose-Capillary: 116 mg/dL — ABNORMAL HIGH (ref 70–99)
Glucose-Capillary: 121 mg/dL — ABNORMAL HIGH (ref 70–99)
Glucose-Capillary: 123 mg/dL — ABNORMAL HIGH (ref 70–99)
Glucose-Capillary: 132 mg/dL — ABNORMAL HIGH (ref 70–99)
Glucose-Capillary: 141 mg/dL — ABNORMAL HIGH (ref 70–99)
Glucose-Capillary: 74 mg/dL (ref 70–99)
Glucose-Capillary: 95 mg/dL (ref 70–99)

## 2019-01-15 LAB — CBC
HCT: 32.4 % — ABNORMAL LOW (ref 39.0–52.0)
HCT: 31.7 % — ABNORMAL LOW (ref 39.0–52.0)
HEMOGLOBIN: 10.1 g/dL — AB (ref 13.0–17.0)
Hemoglobin: 10.4 g/dL — ABNORMAL LOW (ref 13.0–17.0)
MCH: 29.9 pg (ref 26.0–34.0)
MCH: 30.2 pg (ref 26.0–34.0)
MCHC: 32.1 g/dL (ref 30.0–36.0)
MCHC: 31.9 g/dL (ref 30.0–36.0)
MCV: 93.1 fL (ref 80.0–100.0)
MCV: 94.9 fL (ref 80.0–100.0)
PLATELETS: 111 10*3/uL — AB (ref 150–400)
Platelets: 103 10*3/uL — ABNORMAL LOW (ref 150–400)
RBC: 3.48 MIL/uL — ABNORMAL LOW (ref 4.22–5.81)
RBC: 3.34 MIL/uL — ABNORMAL LOW (ref 4.22–5.81)
RDW: 13.2 % (ref 11.5–15.5)
RDW: 13.5 % (ref 11.5–15.5)
WBC: 10.8 10*3/uL — ABNORMAL HIGH (ref 4.0–10.5)
WBC: 10.6 10*3/uL — ABNORMAL HIGH (ref 4.0–10.5)
nRBC: 0 % (ref 0.0–0.2)
nRBC: 0 % (ref 0.0–0.2)

## 2019-01-15 LAB — MAGNESIUM
Magnesium: 3.1 mg/dL — ABNORMAL HIGH (ref 1.7–2.4)
Magnesium: 2.6 mg/dL — ABNORMAL HIGH (ref 1.7–2.4)

## 2019-01-15 MED ORDER — RACEPINEPHRINE HCL 2.25 % IN NEBU: 0.5000 mL | INHALATION_SOLUTION | Freq: Once | RESPIRATORY_TRACT | Status: AC

## 2019-01-15 MED ORDER — CHLORHEXIDINE GLUCONATE CLOTH 2 % EX PADS
6.0000 | MEDICATED_PAD | Freq: Every day | CUTANEOUS | Status: DC
  Administered 2019-01-15 – 2019-01-16 (×2): 6 via TOPICAL

## 2019-01-15 MED ORDER — CHLORHEXIDINE GLUCONATE 0.12% ORAL RINSE (MEDLINE KIT): 15.0000 mL | Freq: Two times a day (BID) | OROMUCOSAL | Status: DC

## 2019-01-15 MED ORDER — SODIUM CHLORIDE 0.9% FLUSH
10.0000 mL | Freq: Two times a day (BID) | INTRAVENOUS | Status: DC
  Administered 2019-01-15 – 2019-01-17 (×5): 10 mL

## 2019-01-15 MED ORDER — DIPHENHYDRAMINE HCL 50 MG/ML IJ SOLN
25.0000 mg | Freq: Once | INTRAMUSCULAR | Status: AC
  Administered 2019-01-15: 25 mg via INTRAVENOUS

## 2019-01-15 MED ORDER — INSULIN ASPART 100 UNIT/ML ~~LOC~~ SOLN
0.0000 [IU] | SUBCUTANEOUS | Status: DC
  Administered 2019-01-15: 2 [IU] via SUBCUTANEOUS

## 2019-01-15 MED ORDER — DIPHENHYDRAMINE HCL 50 MG/ML IJ SOLN
INTRAMUSCULAR | Status: AC
  Filled 2019-01-15: qty 1

## 2019-01-15 MED ORDER — ENOXAPARIN SODIUM 30 MG/0.3ML ~~LOC~~ SOLN
30.0000 mg | Freq: Every day | SUBCUTANEOUS | Status: DC
  Administered 2019-01-15 – 2019-01-17 (×3): 30 mg via SUBCUTANEOUS
  Filled 2019-01-15 (×3): qty 0.3

## 2019-01-15 MED ORDER — SODIUM BICARBONATE 8.4 % IV SOLN
25.0000 meq | Freq: Once | INTRAVENOUS | Status: AC
  Administered 2019-01-15: 25 meq via INTRAVENOUS

## 2019-01-15 MED ORDER — ORAL CARE MOUTH RINSE
15.0000 mL | OROMUCOSAL | Status: DC
  Administered 2019-01-15: 15 mL via OROMUCOSAL

## 2019-01-15 MED ORDER — SODIUM CHLORIDE 0.9% FLUSH: 10.0000 mL | INTRAVENOUS | Status: DC | PRN

## 2019-01-15 MED ORDER — RACEPINEPHRINE HCL 2.25 % IN NEBU
INHALATION_SOLUTION | RESPIRATORY_TRACT | Status: AC
  Administered 2019-01-15: 0.5 mL
  Filled 2019-01-15: qty 0.5

## 2019-01-15 NOTE — Progress Notes (Signed)
Patient ID: Anthony Morrison, male   DOB: 09-20-1941, 78 y.o.   MRN: [id number] EVENING ROUNDS NOTE :     [address]       [place] [address]             [phone]                 1 Day Post-Op Procedure(s) (LRB): Emergency Coronary Artery Bypass Grafting (CABG)  times three using left internal mammary artery to LAD, reversed saphenous vein graft to OM and RCA (N/A) TRANSESOPHAGEAL ECHOCARDIOGRAM (TEE) (N/A) ENDOVEIN HARVEST OF GREATER SAPHENOUS VEIN (Right)  Total Length of Stay:  LOS: 1 day  BP 113/63   Pulse 82   Temp 98.6 F (37 C)   Resp 11   Ht 5\' 6"  (1.676 m)   Wt 78.2 kg   SpO2 97%   BMI 27.83 kg/m   .Intake/Output      01/25 0701 - 01/26 0700   P.O. 600   I.V. (mL/kg) 299.7 (3.8)   Blood    NG/GT    IV Piggyback 100.1   Total Intake(mL/kg) 999.7 (12.8)   Urine (mL/kg/hr) 490 (0.5)   Emesis/NG output    Chest Tube 400   Total Output 890   Net +109.7         . sodium chloride Stopped (01/15/19 0847)  . sodium chloride    . sodium chloride 10 mL/hr at 01/15/19 0136  . cefUROXime (ZINACEF)  IV Stopped (01/15/19 3335)  . dexmedetomidine (PRECEDEX) IV infusion Stopped (01/15/19 0227)  . DOPamine Stopped (01/14/19 2234)  . insulin Stopped (01/15/19 0850)  . lactated ringers    . lactated ringers 20 mL/hr at 01/15/19 1800  . nitroGLYCERIN Stopped (01/15/19 0021)  . phenylephrine (NEO-SYNEPHRINE) Adult infusion Stopped (01/15/19 0853)     Lab Results  Component Value Date   WBC 10.6 (H) 01/15/2019   HGB 10.1 (L) 01/15/2019   HCT 31.7 (L) 01/15/2019   PLT 103 (L) 01/15/2019   GLUCOSE 129 (H) 01/15/2019   ALT 19 01/14/2019   AST 18 01/14/2019   NA 139 01/15/2019   K 4.1 01/15/2019   CL 110 01/15/2019   CREATININE 1.21 01/15/2019   BUN 8 01/15/2019   CO2 24 01/15/2019   INR 1.73 01/14/2019    Walking in hall well Stable day   Grace Isaac MD  Beeper [phone] Office [phone] 01/15/2019 8:02 PM

## 2019-01-15 NOTE — Progress Notes (Signed)
Anesthesiology Follow-up:  Awake and alert, neuro intact. Minimal pain, no stridor or hoarseness noted.  VS: T- 37 BP- 147/52 HR- 95 (SR) RR- 13 O2 Sat 99% on 4L    PA 38/19 CO/CI- 6.4/3.5   K-4.2 BUN/Cr.- 8/1.06 glucose-149 H/H- 10.5/31.0 Platelets- 111,000  Extubated today ay 03:40, stridor noted following extubation, none at present.  78 year old male, one day S/P CABG X 3 for critical L. main disease with preserved LV function.  Stable post-op course, no apparent anesthetic complications.  Anthony Morrison

## 2019-01-15 NOTE — Procedures (Addendum)
Extubation Procedure Note  Patient Details:   Name: Anthony Morrison DOB: Jun 27, 1941 MRN: [id number]   Airway Documentation:    Vent end date: 01/15/19 Vent end time: 0340   Evaluation  O2 sats: stable throughout Complications: No apparent complications Patient did tolerate procedure well. Bilateral Breath Sounds: Clear, Diminished   Yes pt able to speak name and express his current location. Positive cuff leak. Pt placed on [place] 6Lpm. No stridor noted at this time.   Roby Lofts Erryn Dickison 01/15/2019, 4:04 AM

## 2019-01-15 NOTE — Progress Notes (Signed)
Patient ID: TYTON ABDALLAH, male   DOB: 31-May-1941, 78 y.o.   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 using left internal mammary artery to LAD, reversed saphenous vein graft to OM and RCA (N/A) TRANSESOPHAGEAL ECHOCARDIOGRAM (TEE) (N/A) ENDOVEIN HARVEST OF GREATER SAPHENOUS VEIN (Right)  Total Length of Stay:  LOS: 1 day   Subjective: Patient extubated during the night, some stridor when initially asked  Objective: Vital signs in last 24 hours: Temp:  [96.1 F (35.6 C)-99.1 F (37.3 C)] 98.6 F (37 C) (01/25 0800) Pulse Rate:  [58-111] 94 (01/25 0800) Cardiac Rhythm: Normal sinus rhythm (01/25 0800) Resp:  [8-21] 11 (01/25 0800) BP: (76-180)/(47-161) 134/76 (01/25 0800) SpO2:  [91 %-100 %] 100 % (01/25 0800) Arterial Line BP: (72-189)/(41-84) 156/66 (01/25 0800) FiO2 (%):  [40 %-50 %] 40 % (01/25 0255) Weight:  [72.6 kg-78.2 kg] 78.2 kg (01/25 0645)  Filed Weights   01/14/19 1222 01/15/19 0645  Weight: 72.6 kg 78.2 kg    Weight change:    Hemodynamic parameters for last 24 hours: PAP: (22-42)/(5-19) 38/19 CO:  [3.7 L/min-6.4 L/min] 6.4 L/min CI:  [2.1 L/min/m2-3.5 L/min/m2] 3.5 L/min/m2  Intake/Output from previous day: 01/24 0701 - 01/25 0700 In: 6050.5 [I.V.:3630.9; Blood:660; NG/GT:60; IV Piggyback:1699.6] Out: 6712 [Urine:3740; Emesis/NG output:120; Chest Tube:344]  Intake/Output this shift: Total I/O In: 64.8 [I.V.:64.8] Out: 90 [Urine:30; Chest Tube:60]  Current Meds: Scheduled Meds: . acetaminophen  1,000 mg Oral Q6H   Or  . acetaminophen (TYLENOL) oral liquid 160 mg/5 mL  1,000 mg Per Tube Q6H  . aspirin EC  325 mg Oral Daily   Or  . aspirin  324 mg Per Tube Daily  . bisacodyl  10 mg Oral Daily   Or  . bisacodyl  10 mg Rectal Daily  .  Chlorhexidine Gluconate Cloth  6 each Topical Daily  . docusate sodium  200 mg Oral Daily  . insulin regular  0-10 Units Intravenous TID WC  . levalbuterol  0.63 mg Nebulization Q6H  . metoCLOPramide (REGLAN) injection  10 mg Intravenous Q6H  . metoprolol tartrate  12.5 mg Oral BID   Or  . metoprolol tartrate  12.5 mg Per Tube BID  . mometasone-formoterol  2 puff Inhalation BID  . [START ON 01/16/2019] pantoprazole  40 mg Oral Daily  . sodium chloride flush  10-40 mL Intracatheter Q12H  . sodium chloride flush  3 mL Intravenous Q12H   Continuous Infusions: . sodium chloride 20 mL/hr at 01/15/19 0800  . sodium chloride    . sodium chloride 10 mL/hr at 01/15/19 0136  . cefUROXime (ZINACEF)  IV 1.5 g (01/15/19 0847)  . dexmedetomidine (PRECEDEX) IV infusion Stopped (01/15/19 0227)  . DOPamine Stopped (01/14/19 2234)  . insulin Stopped (01/15/19 0850)  . lactated ringers    . lactated ringers 20 mL/hr at 01/15/19 0800  . nitroGLYCERIN Stopped (01/15/19 0021)  . phenylephrine (NEO-SYNEPHRINE) Adult infusion 20 mcg/min (01/15/19 0847)   PRN Meds:.sodium chloride, metoprolol tartrate, midazolam, morphine injection, ondansetron (ZOFRAN) IV, oxyCODONE, sodium chloride flush, sodium chloride flush, traMADol  General appearance: alert and cooperative Neurologic: intact Heart: regular rate and  rhythm, S1, S2 normal, no murmur, click, rub or gallop Lungs: diminished breath sounds bibasilar Abdomen: soft, non-tender; bowel sounds normal; no masses,  no organomegaly Extremities: extremities normal, atraumatic, no cyanosis or edema and Homans sign is negative, no sign of DVT Wound: Sternum stable dressing intact  Lab Results: CBC: Recent Labs    01/14/19 2028  01/15/19 0254 01/15/19 0316 01/15/19 0443  WBC 10.0  --  10.8*  --   --   HGB 11.0*   < > 10.4* 9.9* 10.5*  HCT 33.7*   < > 32.4* 29.0* 31.0*  PLT 110*  --  111*  --   --    < > = values in this interval not displayed.   BMET:    Recent Labs    01/14/19 1336  01/14/19 1853  01/15/19 0254 01/15/19 0316 01/15/19 0443  NA 140   < > 140   < > 139 141 142  K 3.7   < > 4.0   < > 4.1 4.2 4.2  CL 110  --   --   --  112*  --   --   CO2 25  --   --   --  22  --   --   GLUCOSE 94   < > 113*  --  149*  --   --   BUN 12  --   --   --  8  --   --   CREATININE 1.11  --   --   --  1.01  --   --   CALCIUM 8.5*  --   --   --  6.9*  --   --    < > = values in this interval not displayed.    CMET: Lab Results  Component Value Date   WBC 10.8 (H) 01/15/2019   HGB 10.5 (L) 01/15/2019   HCT 31.0 (L) 01/15/2019   PLT 111 (L) 01/15/2019   GLUCOSE 149 (H) 01/15/2019   ALT 19 01/14/2019   AST 18 01/14/2019   NA 142 01/15/2019   K 4.2 01/15/2019   CL 112 (H) 01/15/2019   CREATININE 1.01 01/15/2019   BUN 8 01/15/2019   CO2 22 01/15/2019   INR 1.73 01/14/2019      PT/INR:  Recent Labs    01/14/19 2028  LABPROT 20.1*  INR 1.73   Radiology: Dg Chest Port 1 View  Result Date: 01/15/2019 CLINICAL DATA:  S/P CABG x 3, check support apparatus EXAM: PORTABLE CHEST 1 VIEW COMPARISON:  Chest x-ray dated 01/14/2019. FINDINGS: Endotracheal tube has been removed. Enteric tube has been removed. Swan-Ganz catheter in place with tip just to the LEFT of midline. LEFT-sided chest tube is stable in position. Heart size and mediastinal contours are stable. Probable mild bibasilar atelectasis. Lungs otherwise clear. No pleural effusion. Probable small pneumothorax at the LEFT lung apex, stable. IMPRESSION: 1. Probable small LEFT apical pneumothorax, stable. 2. Endotracheal and enteric tubes have been removed. Remaining support apparatus appears stable in position. 3. Mild bibasilar atelectasis. No evidence of pneumonia or pulmonary edema. Electronically Signed   By: Franki Cabot M.D.   On: 01/15/2019 08:44   Dg Chest Port 1 View  Result Date: 01/14/2019 CLINICAL DATA:  Status post CABG EXAM: PORTABLE CHEST 1 VIEW COMPARISON:  01/14/2019,  01/13/2017 FINDINGS: Endotracheal tube tip is about 3.1 cm superior to the carina. Interval sternotomy changes. Esophageal tube tip below the diaphragm but non included. Right IJ Swan-Ganz catheter tip over  the pulmonary outflow. Interim placement of chest drainage catheters. Probable tiny left apical pneumothorax. Cardiomediastinal silhouette within normal limits. IMPRESSION: Interval postsurgical changes of the mediastinum with placement of support lines and tubes as above. There may be a small left apical pneumothorax. Electronically Signed   By: Donavan Foil M.D.   On: 01/14/2019 20:40   Dg Chest Port 1 View  Result Date: 01/14/2019 CLINICAL DATA:  Preoperative study prior to CABG EXAM: PORTABLE CHEST 1 VIEW COMPARISON:  January 13, 2017 FINDINGS: The PA catheter may terminate in the right ventricle, below the pulmonary outflow tract. Recommend clinical correlation. No pneumothorax. The heart size borderline. The hila and mediastinum are unremarkable. No pulmonary nodules or masses. No focal infiltrates. IMPRESSION: 1. The PA catheter either terminates in the right ventricle or just within the proximal pulmonary outflow tract. Recommend clinical correlation. No pneumothorax. These results will be called to the ordering clinician or representative by the Radiologist Assistant, and communication documented in the PACS or zVision Dashboard. Electronically Signed   By: Dorise Bullion III M.D   On: 01/14/2019 13:55   Vas US Doppler Pre Cabg  Result Date: 01/14/2019 PREOPERATIVE VASCULAR EVALUATION  Indications: Pre cabg. Performing Technologist: Birdena Crandall, [place] RVS Supporting Technologist: Abram Sander RVS  Examination Guidelines: A complete evaluation includes B-mode imaging, spectral Doppler, color Doppler, and power Doppler as needed of all accessible portions of each vessel. Bilateral testing is considered an integral part of a complete examination. Limited examinations for reoccurring indications  may be performed as noted.  Right Carotid Findings: +----------+--------+--------+--------+---------------------+------------------+           PSV cm/sEDV cm/sStenosisDescribe             Comments           +----------+--------+--------+--------+---------------------+------------------+ CCA Prox  67                                           mild intimal                                                              changes            +----------+--------+--------+--------+---------------------+------------------+ CCA Distal62      10                                   mild intimal                                                              changes            +----------+--------+--------+--------+---------------------+------------------+ ICA Prox                          heterogenous         occluded           +----------+--------+--------+--------+---------------------+------------------+ ECA       215  24              heterogenous and     mild plaque with                                     calcific             acoustic shadowing +----------+--------+--------+--------+---------------------+------------------+ Portions of this table do not appear on this page. +----------+--------+-------+--------+------------+           PSV cm/sEDV cmsDescribeArm Pressure +----------+--------+-------+--------+------------+ Subclavian117                    175          +----------+--------+-------+--------+------------+ +---------+--------+--+--------+--+ VertebralPSV cm/s51EDV cm/s19 +---------+--------+--+--------+--+ Left Carotid Findings: +----------+--------+--------+--------+------------+---------------------------+           PSV cm/sEDV cm/sStenosisDescribe    Comments                    +----------+--------+--------+--------+------------+---------------------------+ CCA Prox  96      22                          mild intimal changes         +----------+--------+--------+--------+------------+---------------------------+ CCA Distal86      29                          mild intimal changes        +----------+--------+--------+--------+------------+---------------------------+ ICA Prox  89      36      1-39%   heterogenousmild plaque                 +----------+--------+--------+--------+------------+---------------------------+ ICA Distal128     47      40-59%              plague morphology does not                                                support the increase                                                      possibly due to                                                           contralateral obstruction   +----------+--------+--------+--------+------------+---------------------------+ ECA       288     25              heterogenousnarrowing                   +----------+--------+--------+--------+------------+---------------------------+ +----------+--------+--------+--------+------------+ SubclavianPSV cm/sEDV cm/sDescribeArm Pressure +----------+--------+--------+--------+------------+           87                      160          +----------+--------+--------+--------+------------+ +---------+--------+--+--------+--+  VertebralPSV cm/s49EDV cm/s15 +---------+--------+--+--------+--+  ABI Findings: +--------+------------------+-----+---------+--------+ Right   Rt Pressure (mmHg)IndexWaveform Comment  +--------+------------------+-----+---------+--------+ [id number]                    triphasic         +--------+------------------+-----+---------+--------+ PTA     96                0.55 biphasic          +--------+------------------+-----+---------+--------+ DP      93                0.53 biphasic          +--------+------------------+-----+---------+--------+ +--------+------------------+-----+---------+-------+ Left    Lt Pressure  (mmHg)IndexWaveform Comment +--------+------------------+-----+---------+-------+ Brachial160                    triphasic        +--------+------------------+-----+---------+-------+ PTA     156               0.89 triphasic        +--------+------------------+-----+---------+-------+ DP      156               0.89 biphasic         +--------+------------------+-----+---------+-------+ +-------+---------------+----------------+ ABI/TBIToday's ABI/TBIPrevious ABI/TBI +-------+---------------+----------------+ Right  0.55                            +-------+---------------+----------------+ Left   0.89                            +-------+---------------+----------------+  Right Doppler Findings: +-----------+--------+-----+---------+--------+ Site       PressureIndexDoppler  Comments +-----------+--------+-----+---------+--------+ Brachial   175          triphasic         +-----------+--------+-----+---------+--------+ Radial                  triphasic         +-----------+--------+-----+---------+--------+ Ulnar                   triphasic         +-----------+--------+-----+---------+--------+ Palmar Arch                      abnormal +-----------+--------+-----+---------+--------+  Left Doppler Findings: +-----------+--------+-----+---------+--------+ Site       PressureIndexDoppler  Comments +-----------+--------+-----+---------+--------+ Brachial   160          triphasic         +-----------+--------+-----+---------+--------+ Radial                  triphasic         +-----------+--------+-----+---------+--------+ Ulnar                   triphasic         +-----------+--------+-----+---------+--------+ Palmar Arch                      normal   +-----------+--------+-----+---------+--------+  Summary: Right Carotid: Evidence consistent with a total occlusion of the right ICA. Left Carotid: Velocities in the left ICA are  consistent with a 40-59% stenosis.               See technical comment listed in findings. Vertebrals:  Bilateral vertebral arteries demonstrate antegrade flow. Subclavians: Normal flow hemodynamics were seen in bilateral subclavian  arteries. 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 Left ABI: Resting left ankle-brachial index indicates mild left lower extremity arterial disease. Right Upper Extremity: Doppler waveform obliterate with right radial compression. Doppler waveforms remain within normal limits with right ulnar compression. Left Upper Extremity: Doppler waveforms remain within normal limits with left radial compression. Doppler waveforms remain within normal limits with left ulnar compression.     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

## 2019-01-15 NOTE — Progress Notes (Signed)
RT reassessed pts readiness to wean to next phase and pt was able to hold head up for 20s, pt able to follow directions/commands without difficulty. Next phase of wean protocol began at 0255 in CPAP/PS 10/5 with an FIO2 of 40%. RN will draw ABG at 0315 to further assess pts ability to be extubated. RT will have pt perform NIF and VC to establish ability to be extubated as well. RT will continue to monitor.

## 2019-01-15 NOTE — Progress Notes (Signed)
Upon RT assessment of pt post extubation pt had mild stridor, pt uvula swollen as well. MD notified, racemic and benadryl ordered and administered. MD Ola Spurr at beside. RT will continue to monitor.

## 2019-01-15 NOTE — Progress Notes (Signed)
RT assessed pt for readiness to wean, Pt able to hold head up off of pillow for 20s, pt able to follow command/directions without difficulty. Rapid wean began at this time with SIMV/PRVC/PSV 510 Vt, 5 PEEP, Rate 4, FIO2 40%. RT will reassess pt for phase to of wean process in 20 minutes. RT will continue to monitor.

## 2019-01-16 ENCOUNTER — Inpatient Hospital Stay (HOSPITAL_COMMUNITY): Payer: PPO

## 2019-01-16 LAB — GLUCOSE, CAPILLARY
Glucose-Capillary: 97 mg/dL (ref 70–99)
Glucose-Capillary: 101 mg/dL — ABNORMAL HIGH (ref 70–99)
Glucose-Capillary: 104 mg/dL — ABNORMAL HIGH (ref 70–99)
Glucose-Capillary: 113 mg/dL — ABNORMAL HIGH (ref 70–99)
Glucose-Capillary: 100 mg/dL — ABNORMAL HIGH (ref 70–99)

## 2019-01-16 LAB — CBC
HCT: 30.8 % — ABNORMAL LOW (ref 39.0–52.0)
Hemoglobin: 9.6 g/dL — ABNORMAL LOW (ref 13.0–17.0)
MCH: 30.1 pg (ref 26.0–34.0)
MCHC: 31.2 g/dL (ref 30.0–36.0)
MCV: 96.6 fL (ref 80.0–100.0)
Platelets: 95 10*3/uL — ABNORMAL LOW (ref 150–400)
RBC: 3.19 MIL/uL — ABNORMAL LOW (ref 4.22–5.81)
RDW: 13.6 % (ref 11.5–15.5)
WBC: 10.5 10*3/uL (ref 4.0–10.5)
nRBC: 0 % (ref 0.0–0.2)

## 2019-01-16 LAB — BASIC METABOLIC PANEL
Anion gap: 7 (ref 5–15)
BUN: 11 mg/dL (ref 8–23)
CO2: 24 mmol/L (ref 22–32)
Calcium: 7.7 mg/dL — ABNORMAL LOW (ref 8.9–10.3)
Chloride: 107 mmol/L (ref 98–111)
Creatinine, Ser: 1.17 mg/dL (ref 0.61–1.24)
GFR calc Af Amer: >60 mL/min (ref >60)
GFR calc non Af Amer: 60 mL/min — ABNORMAL LOW (ref >60)
Glucose, Bld: 105 mg/dL — ABNORMAL HIGH (ref 70–99)
Potassium: 4 mmol/L (ref 3.5–5.1)
Sodium: 138 mmol/L (ref 135–145)

## 2019-01-16 MED ORDER — FUROSEMIDE 10 MG/ML IJ SOLN
20.0000 mg | Freq: Two times a day (BID) | INTRAMUSCULAR | Status: AC
  Administered 2019-01-16 (×2): 20 mg via INTRAVENOUS
  Filled 2019-01-16 (×2): qty 2

## 2019-01-16 NOTE — Progress Notes (Signed)
EVENING ROUNDS NOTE :     [address]       [place] [address]             [phone]                 2 Days Post-Op Procedure(s) (LRB): Emergency Coronary Artery Bypass Grafting (CABG)  times three using left internal mammary artery to LAD, reversed saphenous vein graft to OM and RCA (N/A) TRANSESOPHAGEAL ECHOCARDIOGRAM (TEE) (N/A) ENDOVEIN HARVEST OF GREATER SAPHENOUS VEIN (Right)  Total Length of Stay:  LOS: 2 days  BP 121/66   Pulse 90   Temp 97.9 F (36.6 C) (Oral)   Resp 15   Ht 5\' 6"  (1.676 m)   Wt 78.3 kg   SpO2 99%   BMI 27.87 kg/m   .Intake/Output      01/26 0701 - 01/27 0700   P.O.    I.V. (mL/kg) 0 (0)   IV Piggyback    Total Intake(mL/kg) 0 (0)   Urine (mL/kg/hr) 1150 (1.2)   Chest Tube 20   Total Output 1170   Net -1170         . sodium chloride Stopped (01/15/19 0847)  . sodium chloride    . sodium chloride 10 mL/hr at 01/15/19 0136  . dexmedetomidine (PRECEDEX) IV infusion Stopped (01/15/19 0227)  . DOPamine Stopped (01/14/19 2234)  . insulin Stopped (01/15/19 0850)  . lactated ringers    . lactated ringers Stopped (01/15/19 2135)  . nitroGLYCERIN Stopped (01/15/19 0021)  . phenylephrine (NEO-SYNEPHRINE) Adult infusion Stopped (01/15/19 0853)     Lab Results  Component Value Date   WBC 10.5 01/16/2019   HGB 9.6 (L) 01/16/2019   HCT 30.8 (L) 01/16/2019   PLT 95 (L) 01/16/2019   GLUCOSE 105 (H) 01/16/2019   ALT 19 01/14/2019   AST 18 01/14/2019   NA 138 01/16/2019   K 4.0 01/16/2019   CL 107 01/16/2019   CREATININE 1.17 01/16/2019   BUN 11 01/16/2019   CO2 24 01/16/2019   INR 1.73 01/14/2019   Stable day Central line out    Grace Isaac MD  Beeper [phone] Office [phone] 01/16/2019 7:29 PM

## 2019-01-16 NOTE — Progress Notes (Signed)
RN aware of order to discontinue central line.

## 2019-01-16 NOTE — Progress Notes (Signed)
   01/16/19 0800  Clinical Encounter Type  Visited With Patient  Visit Type Initial  Referral From Nurse  Responded to [facility] for AD. Gave patient AD documents and explained the process. Patient said that his wife will be in shortly and they will fill out the forms. Informed patient that papers can be notarized on Monday.

## 2019-01-16 NOTE — Plan of Care (Signed)
  Problem: Education: Goal: Knowledge of General Education information will improve Description Including pain rating scale, medication(s)/side effects and non-pharmacologic comfort measures Outcome: Progressing   Problem: Clinical Measurements: Goal: Ability to maintain clinical measurements within normal limits will improve Outcome: Progressing Goal: Will remain free from infection Outcome: Progressing Goal: Diagnostic test results will improve Outcome: Progressing Goal: Respiratory complications will improve Outcome: Progressing Goal: Cardiovascular complication will be avoided Outcome: Progressing   Problem: Activity: Goal: Risk for activity intolerance will decrease Outcome: Progressing   Problem: Nutrition: Goal: Adequate nutrition will be maintained Outcome: Progressing   Problem: Coping: Goal: Level of anxiety will decrease Outcome: Progressing   Problem: Education: Goal: Will demonstrate proper wound care and an understanding of methods to prevent future damage Outcome: Progressing Goal: Knowledge of disease or condition will improve Outcome: Progressing Goal: Knowledge of the prescribed therapeutic regimen will improve Outcome: Progressing   Problem: Activity: Goal: Risk for activity intolerance will decrease Outcome: Progressing   Problem: Cardiac: Goal: Will achieve and/or maintain hemodynamic stability Outcome: Progressing   Problem: Clinical Measurements: Goal: Postoperative complications will be avoided or minimized Outcome: Progressing   Problem: Respiratory: Goal: Respiratory status will improve Outcome: Progressing   Problem: Skin Integrity: Goal: Wound healing without signs and symptoms of infection Outcome: Progressing Goal: Risk for impaired skin integrity will decrease Outcome: Progressing

## 2019-01-16 NOTE — Progress Notes (Signed)
Patient ID: Anthony Morrison, male   DOB: 1941-02-23, 78 y.o.   MRN: [id number] TCTS DAILY ICU PROGRESS NOTE                   [address]            [place] [address]          [phone]   2 Days Post-Op Procedure(s) (LRB): Emergency Coronary Artery Bypass Grafting (CABG)  times three using left internal mammary artery to LAD, reversed saphenous vein graft to OM and RCA (N/A) TRANSESOPHAGEAL ECHOCARDIOGRAM (TEE) (N/A) ENDOVEIN HARVEST OF GREATER SAPHENOUS VEIN (Right)  Total Length of Stay:  LOS: 2 days   Subjective: Patient awake alert neurologically intact.  Objective: Vital signs in last 24 hours: Temp:  [98.1 F (36.7 C)-98.7 F (37.1 C)] 98.7 F (37.1 C) (01/26 0753) Pulse Rate:  [67-104] 100 (01/26 0909) Cardiac Rhythm: Heart block (01/26 0000) Resp:  [8-24] 15 (01/26 0900) BP: (89-147)/(46-71) 110/58 (01/26 0909) SpO2:  [84 %-100 %] 96 % (01/26 0921) Arterial Line BP: (91-180)/(45-69) 109/48 (01/25 1400) Weight:  [78.3 kg] 78.3 kg (01/26 0640)  Filed Weights   01/14/19 1222 01/15/19 0645 01/16/19 0640  Weight: 72.6 kg 78.2 kg 78.3 kg    Weight change: 5.76 kg   Hemodynamic parameters for last 24 hours:    Intake/Output from previous day: 01/25 0701 - 01/26 0700 In: 1394.2 [P.O.:840; I.V.:371.2; IV Piggyback:183.1] Out: 1270 [Urine:740; Chest Tube:530]  Intake/Output this shift: No intake/output data recorded.  Current Meds: Scheduled Meds: . acetaminophen  1,000 mg Oral Q6H   Or  . acetaminophen (TYLENOL) oral liquid 160 mg/5 mL  1,000 mg Per Tube Q6H  . aspirin EC  325 mg Oral Daily   Or  . aspirin  324 mg Per Tube Daily  . bisacodyl  10 mg Oral Daily   Or  . bisacodyl  10 mg Rectal Daily  . Chlorhexidine Gluconate Cloth  6 each Topical Daily  . docusate sodium  200 mg Oral Daily  . enoxaparin (LOVENOX) injection  30 mg Subcutaneous QHS  . insulin aspart  0-24 Units Subcutaneous Q4H  . insulin regular  0-10 Units Intravenous  TID WC  . levalbuterol  0.63 mg Nebulization Q6H  . metoprolol tartrate  12.5 mg Oral BID   Or  . metoprolol tartrate  12.5 mg Per Tube BID  . mometasone-formoterol  2 puff Inhalation BID  . pantoprazole  40 mg Oral Daily  . sodium chloride flush  10-40 mL Intracatheter Q12H  . sodium chloride flush  3 mL Intravenous Q12H   Continuous Infusions: . sodium chloride Stopped (01/15/19 0847)  . sodium chloride    . sodium chloride 10 mL/hr at 01/15/19 0136  . cefUROXime (ZINACEF)  IV 1.5 g (01/16/19 0914)  . dexmedetomidine (PRECEDEX) IV infusion Stopped (01/15/19 0227)  . DOPamine Stopped (01/14/19 2234)  . insulin Stopped (01/15/19 0850)  . lactated ringers    . lactated ringers Stopped (01/15/19 2135)  . nitroGLYCERIN Stopped (01/15/19 0021)  . phenylephrine (NEO-SYNEPHRINE) Adult infusion Stopped (01/15/19 0853)   PRN Meds:.sodium chloride, metoprolol tartrate, midazolam, morphine injection, ondansetron (ZOFRAN) IV, oxyCODONE, sodium chloride flush, sodium chloride flush, traMADol  General appearance: alert and cooperative Neurologic: intact Heart: regular rate and rhythm, S1, S2 normal, no murmur, click, rub or gallop Lungs: diminished breath sounds bibasilar Abdomen: soft, non-tender; bowel sounds normal; no masses,  no organomegaly Extremities: extremities normal, atraumatic, no cyanosis or edema and  Homans sign is negative, no sign of DVT Wound: Sternum stable dressing intact  Lab Results: CBC: Recent Labs    01/15/19 1601 01/16/19 0520  WBC 10.6* 10.5  HGB 10.1* 9.6*  HCT 31.7* 30.8*  PLT 103* 95*   BMET:  Recent Labs    01/15/19 1601 01/16/19 0520  NA 139 138  K 4.1 4.0  CL 110 107  CO2 24 24  GLUCOSE 129* 105*  BUN 8 11  CREATININE 1.21 1.17  CALCIUM 7.3* 7.7*    CMET: Lab Results  Component Value Date   WBC 10.5 01/16/2019   HGB 9.6 (L) 01/16/2019   HCT 30.8 (L) 01/16/2019   PLT 95 (L) 01/16/2019   GLUCOSE 105 (H) 01/16/2019   ALT 19 01/14/2019    AST 18 01/14/2019   NA 138 01/16/2019   K 4.0 01/16/2019   CL 107 01/16/2019   CREATININE 1.17 01/16/2019   BUN 11 01/16/2019   CO2 24 01/16/2019   INR 1.73 01/14/2019      PT/INR:  Recent Labs    01/14/19 2028  LABPROT 20.1*  INR 1.73   Radiology: Dg Chest Port 1 View  Result Date: 01/16/2019 CLINICAL DATA:  Postop evaluation EXAM: PORTABLE CHEST 1 VIEW COMPARISON:  Yesterday FINDINGS: Stable left chest tube positioning. Mild increase in still small left apical pneumothorax which is now 2 posterior rib interspaces in height. Swan-Ganz catheter has been removed. Increasing hazy opacity on the right attributed to pleural fluid and atelectasis. Milder atelectasis at the left base. Stable postoperative heart size. IMPRESSION: 1. Mild increase in still small left pneumothorax. 2. Increased pleural fluid and/or atelectasis at the right base. Electronically Signed   By: Monte Fantasia M.D.   On: 01/16/2019 07:16     Assessment/Plan: S/P Procedure(s) (LRB): Emergency Coronary Artery Bypass Grafting (CABG)  times three using left internal mammary artery to LAD, reversed saphenous vein graft to OM and RCA (N/A) TRANSESOPHAGEAL ECHOCARDIOGRAM (TEE) (N/A) ENDOVEIN HARVEST OF GREATER SAPHENOUS VEIN (Right) Mobilize d/c tubes/lines Some right pleural effusion,  DC left chest tube and Foley Renal function stable DC central line  Grace Isaac 01/16/2019 9:26 AM

## 2019-01-17 ENCOUNTER — Inpatient Hospital Stay (HOSPITAL_COMMUNITY): Payer: PPO

## 2019-01-17 ENCOUNTER — Encounter (HOSPITAL_COMMUNITY): Payer: Self-pay | Admitting: Cardiothoracic Surgery

## 2019-01-17 LAB — CBC
HCT: 30 % — ABNORMAL LOW (ref 39.0–52.0)
Hemoglobin: 10 g/dL — ABNORMAL LOW (ref 13.0–17.0)
MCH: 31.1 pg (ref 26.0–34.0)
MCHC: 33.3 g/dL (ref 30.0–36.0)
MCV: 93.2 fL (ref 80.0–100.0)
Platelets: 109 10*3/uL — ABNORMAL LOW (ref 150–400)
RBC: 3.22 MIL/uL — ABNORMAL LOW (ref 4.22–5.81)
RDW: 13.3 % (ref 11.5–15.5)
WBC: 10.2 10*3/uL (ref 4.0–10.5)
nRBC: 0 % (ref 0.0–0.2)

## 2019-01-17 LAB — BASIC METABOLIC PANEL
Anion gap: 12 (ref 5–15)
BUN: 14 mg/dL (ref 8–23)
CO2: 25 mmol/L (ref 22–32)
Calcium: 8 mg/dL — ABNORMAL LOW (ref 8.9–10.3)
Chloride: 99 mmol/L (ref 98–111)
Creatinine, Ser: 1.29 mg/dL — ABNORMAL HIGH (ref 0.61–1.24)
GFR calc Af Amer: >60 mL/min (ref >60)
GFR calc non Af Amer: 53 mL/min — ABNORMAL LOW (ref >60)
Glucose, Bld: 89 mg/dL (ref 70–99)
Potassium: 3.6 mmol/L (ref 3.5–5.1)
Sodium: 136 mmol/L (ref 135–145)

## 2019-01-17 LAB — GLUCOSE, CAPILLARY
Glucose-Capillary: 87 mg/dL (ref 70–99)
Glucose-Capillary: 88 mg/dL (ref 70–99)
Glucose-Capillary: 89 mg/dL (ref 70–99)
Glucose-Capillary: 90 mg/dL (ref 70–99)
Glucose-Capillary: 93 mg/dL (ref 70–99)
Glucose-Capillary: 97 mg/dL (ref 70–99)

## 2019-01-17 MED ORDER — POTASSIUM CHLORIDE 20 MEQ PO PACK
20.0000 meq | PACK | Freq: Every day | ORAL | Status: DC
  Administered 2019-01-17 – 2019-01-18 (×2): 20 meq via ORAL
  Filled 2019-01-17 (×2): qty 1

## 2019-01-17 MED ORDER — SODIUM CHLORIDE 0.9% FLUSH
3.0000 mL | Freq: Two times a day (BID) | INTRAVENOUS | Status: DC
  Administered 2019-01-17 – 2019-01-18 (×2): 3 mL via INTRAVENOUS

## 2019-01-17 MED ORDER — SODIUM CHLORIDE 0.9 % IV SOLN: 250.0000 mL | INTRAVENOUS | Status: DC | PRN

## 2019-01-17 MED ORDER — POTASSIUM CHLORIDE CRYS ER 20 MEQ PO TBCR
20.0000 meq | EXTENDED_RELEASE_TABLET | ORAL | Status: DC
  Administered 2019-01-17: 20 meq via ORAL
  Filled 2019-01-17: qty 1

## 2019-01-17 MED ORDER — METOPROLOL TARTRATE 25 MG/10 ML ORAL SUSPENSION: 12.5000 mg | Freq: Two times a day (BID) | ORAL | Status: DC

## 2019-01-17 MED ORDER — LEVALBUTEROL HCL 0.63 MG/3ML IN NEBU: 0.6300 mg | INHALATION_SOLUTION | Freq: Four times a day (QID) | RESPIRATORY_TRACT | Status: DC | PRN

## 2019-01-17 MED ORDER — FUROSEMIDE 40 MG PO TABS
40.0000 mg | ORAL_TABLET | Freq: Every day | ORAL | Status: DC
  Administered 2019-01-17 – 2019-01-18 (×2): 40 mg via ORAL
  Filled 2019-01-17 (×2): qty 1

## 2019-01-17 MED ORDER — LACTULOSE 10 GM/15ML PO SOLN: 20.0000 g | Freq: Every day | ORAL | Status: DC | PRN

## 2019-01-17 MED ORDER — METOPROLOL TARTRATE 25 MG PO TABS
25.0000 mg | ORAL_TABLET | Freq: Two times a day (BID) | ORAL | Status: DC
  Administered 2019-01-17 – 2019-01-18 (×3): 25 mg via ORAL
  Filled 2019-01-17 (×3): qty 1

## 2019-01-17 MED ORDER — MOVING RIGHT ALONG BOOK
Freq: Once | Status: AC
  Administered 2019-01-17: 15:00:00
  Filled 2019-01-17: qty 1

## 2019-01-17 MED ORDER — SODIUM CHLORIDE 0.9% FLUSH: 3.0000 mL | INTRAVENOUS | Status: DC | PRN

## 2019-01-17 MED ORDER — LISINOPRIL 10 MG PO TABS
10.0000 mg | ORAL_TABLET | Freq: Every day | ORAL | Status: DC
  Administered 2019-01-17 – 2019-01-18 (×2): 10 mg via ORAL
  Filled 2019-01-17 (×2): qty 1

## 2019-01-17 MED ORDER — ATORVASTATIN CALCIUM 10 MG PO TABS
20.0000 mg | ORAL_TABLET | Freq: Every day | ORAL | Status: DC
  Administered 2019-01-17: 20 mg via ORAL
  Filled 2019-01-17: qty 2

## 2019-01-17 MED ORDER — POTASSIUM CHLORIDE CRYS ER 20 MEQ PO TBCR: 20.0000 meq | EXTENDED_RELEASE_TABLET | ORAL | Status: DC | PRN

## 2019-01-17 MED ORDER — LEVALBUTEROL HCL 0.63 MG/3ML IN NEBU
0.6300 mg | INHALATION_SOLUTION | Freq: Two times a day (BID) | RESPIRATORY_TRACT | Status: DC
  Administered 2019-01-17 – 2019-01-18 (×2): 0.63 mg via RESPIRATORY_TRACT
  Filled 2019-01-17 (×2): qty 3

## 2019-01-17 NOTE — Op Note (Signed)
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  SURGICAL PROCEDURE:  Emergency coronary artery bypass grafting x3 with the left internal mammary to the left anterior descending coronary artery, reverse saphenous vein graft to the obtuse marginal coronary artery, reverse saphenous vein graft to the  posterior descending coronary artery with right thigh greater saphenous endoscopic vein harvesting.  SURGEON:  Lanelle Bal, MD  FIRST ASSISTANT:  Ellwood Handler, PA-C  BRIEF HISTORY:  The patient is a 78 year old male with known COPD and previous smoking history who was being evaluated for umbilical hernia repair.  Abnormal preoperative EKG led to echo stress test, and on the morning of surgery, a cardiac  catheterization by Dr. Nehemiah Massed at [facility].  At the time of catheterization, the patient was found to have critical left main obstruction of at least 95% with 70% proximal right obstruction.  Overall, ventricular function was preserved.  When  questioned, the patient had been having symptoms of shortness of breath and epigastric pain with exertion which he attributed to his COPD.  With the critical nature of the patient's anatomy, he was transferred to [facility] for emergent coronary  artery bypass grafting.  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  Because of his critical anatomy, it was felt prudent  to proceed immediately with bypass surgery.  The patient and his family were  agreeable, and the patient signed informed consent.  DESCRIPTION OF PROCEDURE:  With Swan-Ganz and arterial line monitors in place, the patient underwent general endotracheal anesthesia without incident.  Skin of the chest and legs was prepped with Betadine and draped in the usual sterile manner.  Dr. Gifford Shave  placed a TEE probe.  Overall, the patient's ventricular function appeared preserved.  His cardiac valves appeared to be functioning well.  Chest, abdomen and legs were prepped with Betadine, draped in a sterile manner.  Appropriate timeout was  performed, and we proceeded with endoscopic vein harvesting of the right greater saphenous vein from the thigh and upper calf.  The vein was of good quality and caliber.  Median sternotomy was performed.  Left internal mammary artery was dissected down  as a pedicle graft.  The distal artery was divided and had good free flow.  Pericardium was opened.  Overall, ventricular function appeared preserved.  The patient was systemically heparinized.  Ascending aorta was cannulated.  The right atrium was  cannulated.  An aortic root vent cardioplegia needle was introduced into the ascending aorta.  The patient was placed on cardiopulmonary bypass at 2.4 L per minute per sq m.  Sites of anastomosis were selected and dissected at the epicardium.  The  patient's body temperature was cooled to 32 degrees.  Aortic crossclamp was applied; 500 mL of cold blood potassium cardioplegia was administered with diastolic arrest of the heart.  Myocardial septal temperature was monitored throughout the crossclamp.   We turned our attention first to the posterior descending coronary artery which was opened and was a fairly small vessel, approximately 1.3 mm in size.  Using a running 7-0 Prolene, distal anastomosis was performed.  Attention  was then turned to the  obtuse marginal coronary artery which was opened and admitted a 1.5 mm probe distally.  Using a running 7-0 Prolene, distal  anastomosis was performed with a segment of reverse saphenous vein graft.  We then turned our attention to the left anterior  descending coronary artery.  The LAD was a relatively small vessel.  It was opened and admitted 1 mm probe proximally and distally.  Using a running 8-0 Prolene, the left internal mammary artery was anastomosed to the left anterior descending coronary  artery.  With the crossclamp still in place, 2 punch aortotomies were performed in the ascending aorta .  Each of the vein grafts were anastomosed to the ascending aorta with running 6-0 Prolene.  The bulldog was removed from the mammary artery with rise  in myocardial septal temperature.  The heart was allowed to passively fill and deair.  The proximal anastomoses were completed and the crossclamp removed with a total crossclamp time of 83 minutes.  The patient spontaneously converted to a sinus rhythm.   Sites of anastomoses were inspected and free of bleeding.  The fascia of the mammary artery was tacked to the epicardium.  Atrial and ventricular pacing wires were applied.  The patient was then ventilated and weaned from cardiopulmonary bypass without  difficulty.  He remained hemodynamically stable.  He was decannulated in the usual fashion.  Protamine sulfate was administered.  With protamine, the patient had transient decrease in blood pressure but quickly returned to normal with operative field  hemostatic.  Graft markers were applied.  A left pleural tube and Blake mediastinal drains were left in place.  The pericardium was loosely reapproximated.  Sternum was closed with #6 stainless steel wire.  Fascia closed with interrupted 0 Vicryl,  running 3-0 Vicryl in subcutaneous tissue, 3-0 subcuticular stitch in skin edges.  Dry dressings were applied.  Sponge and needle count was reported as correct at completion of procedure.  The patient tolerated the procedure without obvious complication.   Sponge and needle count was reported  as correct at completion of procedure.  RF scanning revealed clear code.  Total pump time was 110 minutes.  The patient did not require any blood bank blood products during the operative procedure.  LN/NUANCE  D:01/16/2019 T:01/16/2019 JOB:[id number]

## 2019-01-17 NOTE — Discharge Summary (Signed)
Physician Discharge Summary  Patient ID: Anthony Morrison MRN: [id number] DOB/AGE: [birth year] 78 y.o.  Admit date: 01/14/2019 Discharge date: 01/21/2019  Admission Diagnoses: Severe left main and multivessel coronary artery disease  Discharge Diagnoses:  Active Problems:   S/P CABG x 3  Patient Active Problem List   Diagnosis Date Noted  . S/P CABG x 3 01/14/2019  . 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 Acute respiratory failure with hypoxia ([place]) 01/13/2017   Past Medical History:  Diagnosis Date  . Arthritis   . Bronchitis   . Cancer ([place])    SKIN CANCERS  . COPD (chronic obstructive pulmonary disease) ([facility])    MILD PER PT  . Pneumonia 2016    History of the present illness:  The patient is a 78 year old male who was transferred from [facility] following cardiac catheterization.  He was recently seen by general surgery due to a umbilical hernia that was bothering him.  A preoperative EKG showed "flipped T waves".  Due to these findings on EKG and echocardiogram stress test was performed and subsequent to that a cardiac catheterization.  The patient was found to have a 99% left main obstruction as well as three-vessel coronary artery disease.  The patient has noticed recent shortness of breath with exertion with mild right-sided chest pain.  He attributed this to his COPD.  He is a former smoker but he has quit following a hospitalization for pneumonia several years ago.  Due to these findings urgent cardiothoracic surgical consultation was obtained with Lanelle Bal, MD who evaluated the patient and studies and recommended proceeding with emergent coronary artery surgical revascularization.  Discharged Condition: good  Hospital Course: The patient was admitted in transfer from [facility] and taken to the operating room where he underwent the below described procedure.  He tolerated it well and was taken to the surgical intensive care unit in stable condition.  Postoperative  hospital course  Patient is done well postoperatively.  He was weaned from the ventilator without difficulty using standard protocols.  He has remained hemodynamically stable and is sinus rhythm.  Beta-blocker has been adjusted some of his he has had some ventricular ectopy.  He had some elevation in blood pressure and ACE inhibitor was initiated.  He did have some postoperative volume overload which responded well to Lasix.  This will be continued for a short-term as an outpatient as well.  He has a small right pleural effusion on chest x-ray that has shown steady improvement.  Oxygen was able to be weaned and he was maintaining good saturations on room air.  He does have a expected acute blood loss anemia which has been stabilized.  Most recent hemoglobin hematocrit at time of discharge was 9.9 and 29.5 respectively.  He did have some mild acute renal insufficiency in the postoperative period but creatinine has returned to the normal level.  Most recent BUN and creatinine dated 01/18/2019 are 15 and 1.20 respectively.  The patient's incisions were noted to be healing well.  He was tolerating cardiac rehab without significant difficulty.  Routine lines, monitors and drainage devices were discontinued in the standard fashion and at the time of discharge the patient was felt to be quite stable.    Consults: cardiology  Significant Diagnostic Studies: angiography: Cardiac catheterization  Ost RCA lesion is 85% stenosed.  Mid LM lesion is 95% stenosed.  Ost LM lesion is 50% stenosed.  Prox LAD lesion is 40% stenosed.  Prox RCA lesion is 60% stenosed.   78 year old  male with essential hypertension mixed hyperlipidemia having progressive shortness of breath and abnormal stress test consistent with diffuse coronary artery disease  LV systolic function normal with ejection fraction of 60%  Critical distal left main coronary atherosclerosis of 95% Critical ostial right coronary artery stenosis of  85%  Plan Transfer for coronary artery bypass surgery due to critical left main and ostial right coronary artery stenosis Antiplatelet medication management with aspirin High intensity cholesterol therapy Cardiac rehabilitation after above  Treatments: surgery:  OPERATIVE REPORT  DATE OF PROCEDURE:  01/14/2019  PREOPERATIVE DIAGNOSES:  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  SURGICAL PROCEDURE:  Emergency coronary artery bypass grafting x3 with the left internal mammary to the left anterior descending coronary artery, reverse saphenous vein graft to the obtuse marginal coronary artery, reverse saphenous vein graft to the  posterior descending coronary artery with right thigh greater saphenous endoscopic vein harvesting.  SURGEON:  Lanelle Bal, MD  FIRST ASSISTANT:  Ellwood Handler, PA-C  Discharge Exam: Blood pressure (!) 141/79, pulse 99, temperature 98.4 F (36.9 C), temperature source Oral, resp. rate (!) 23, height 5\' 6"  (1.676 m), weight 74.6 kg, SpO2 95 %.  General appearance: alert, cooperative and no distress Heart: regular rate and rhythm and occ extrasystole Lungs: dim right base Abdomen: benign Extremities: trace edema Wound: incis healing well Disposition: Discharge disposition: 01-Home or Self Care       Discharge Instructions    Discharge patient   Complete by:  As directed    Discharge disposition:  01-Home or Self Care   Discharge patient date:  01/18/2019     Allergies as of 01/18/2019      Reactions   Sulfa Antibiotics Other (See Comments)   Unknown      Medication List    STOP taking these medications   isosorbide mononitrate 30 MG 24 hr tablet Commonly known as:  IMDUR     TAKE these medications   albuterol 108 (90 Base) MCG/ACT inhaler Commonly known as:  PROVENTIL HFA;VENTOLIN  HFA Inhale 1 puff into the lungs every 6 (six) hours as needed for wheezing or shortness of breath.   aspirin 325 MG EC tablet Take 1 tablet (325 mg total) by mouth daily.   atorvastatin 20 MG tablet Commonly known as:  LIPITOR Take 1 tablet (20 mg total) by mouth daily at 6 PM.   Fluticasone-Salmeterol 250-50 MCG/DOSE Aepb Commonly known as:  ADVAIR DISKUS Inhale 1 puff into the lungs 2 times daily at 12 noon and 4 pm.   furosemide 20 MG tablet Commonly known as:  LASIX Take 1 tablet (20 mg total) by mouth daily for 7 days.   lisinopril 10 MG tablet Commonly known as:  PRINIVIL,ZESTRIL Take 1 tablet (10 mg total) by mouth daily.   metoprolol tartrate 25 MG tablet Commonly known as:  LOPRESSOR Take 1 tablet (25 mg total) by mouth 2 (two) times daily.   oxyCODONE 5 MG immediate release tablet Commonly known as:  Oxy IR/ROXICODONE Take 1-2 tablets (5-10 mg total) by mouth every 6 (six) hours as needed for up to 7 days for severe pain.   oxymetazoline 0.05 % nasal spray Commonly known as:  AFRIN Place 1 spray into both nostrils at bedtime.   potassium chloride 10 MEQ tablet Commonly known as:  KLOR-CON 10 Take 1 tablet (10 mEq total) by mouth daily for 7 days.  Follow-up Information    Corey Skains, MD Follow up.   Specialty:  Cardiology Contact information: [address] [place] [facility] [place] [address] [phone]        Corey Skains, MD Follow up.   Specialty:  Cardiology Why:  Please contact your cardiology providers office and arrange for a 2-week follow-up appointment. Contact information: [address] [facility] [place] [address] [phone]        Grace Isaac, MD Follow up.   Specialty:  Cardiothoracic Surgery Why:  See discharge paperwork for a 4-week follow-up appointment with Dr. Servando Snare.  Please also obtain a chest x-ray at [facility] 1/2-hour prior to  this appointment.  It is located in the same office complex. Contact information: [place] [address] [phone]          The patient has been discharged on:   1.Beta Blocker:  Yes [ y  ]                              No   [   ]                              If No, reason:  2.Ace Inhibitor/ARB: Yes [ y  ]                                     No  [    ]                                     If No, reason:  3.Statin:   Yes [ y  ]                  No  [   ]                  If No, reason:  4.Ecasa:  Yes  [ y  ]                  No   [   ]                  If No, reason:  Signed: [name] 01/21/2019, 9:41 AM

## 2019-01-17 NOTE — Progress Notes (Addendum)
[address]       [place] [address]             [phone]      3 Days Post-Op Procedure(s) (LRB): Emergency Coronary Artery Bypass Grafting (CABG)  times three using left internal mammary artery to LAD, reversed saphenous vein graft to OM and RCA (N/A) TRANSESOPHAGEAL ECHOCARDIOGRAM (TEE) (N/A) ENDOVEIN HARVEST OF GREATER SAPHENOUS VEIN (Right) Subjective: Feels pretty well overall  Objective: Vital signs in last 24 hours: Temp:  [97.9 F (36.6 C)-98.8 F (37.1 C)] 98 F (36.7 C) (01/27 0754) Pulse Rate:  [84-109] 105 (01/27 0700) Cardiac Rhythm: Normal sinus rhythm;Sinus tachycardia (01/26 2000) Resp:  [12-25] 17 (01/27 0700) BP: (91-168)/(55-84) 130/82 (01/27 0700) SpO2:  [90 %-100 %] 99 % (01/27 0740) Weight:  [74.8 kg] 74.8 kg (01/27 0500)  Hemodynamic parameters for last 24 hours:    Intake/Output from previous day: 01/26 0701 - 01/27 0700 In: 240 [P.O.:240] Out: 2520 [Urine:2500; Chest Tube:20] Intake/Output this shift: No intake/output data recorded.  General appearance: alert, cooperative and no distress Heart: regular rate and rhythm and occ extrasystole Lungs: dim right base Abdomen: mild distension, nontender Extremities: minor edema Wound: evh site healing well, sternal dressing in place  Lab Results: Recent Labs    01/16/19 0520 01/17/19 0338  WBC 10.5 10.2  HGB 9.6* 10.0*  HCT 30.8* 30.0*  PLT 95* 109*   BMET:  Recent Labs    01/16/19 0520 01/17/19 0338  NA 138 136  K 4.0 3.6  CL 107 99  CO2 24 25  GLUCOSE 105* 89  BUN 11 14  CREATININE 1.17 1.29*  CALCIUM 7.7* 8.0*    PT/INR:  Recent Labs    01/14/19 2028  LABPROT 20.1*  INR 1.73   ABG    Component Value Date/Time   PHART 7.301 (L) 01/15/2019 0443   HCO3 23.6 01/15/2019 0443   TCO2 25 01/15/2019 0443   ACIDBASEDEF 3.0 (H) 01/15/2019 0443   O2SAT 99.0 01/15/2019 0443   CBG (last 3)  Recent Labs    01/16/19 2032 01/16/19 2339  01/17/19 0313  GLUCAP 100* 93 88    Meds Scheduled Meds: . acetaminophen  1,000 mg Oral Q6H   Or  . acetaminophen (TYLENOL) oral liquid 160 mg/5 mL  1,000 mg Per Tube Q6H  . aspirin EC  325 mg Oral Daily   Or  . aspirin  324 mg Per Tube Daily  . bisacodyl  10 mg Oral Daily   Or  . bisacodyl  10 mg Rectal Daily  . Chlorhexidine Gluconate Cloth  6 each Topical Daily  . docusate sodium  200 mg Oral Daily  . enoxaparin (LOVENOX) injection  30 mg Subcutaneous QHS  . insulin aspart  0-24 Units Subcutaneous Q4H  . levalbuterol  0.63 mg Nebulization Q6H  . metoprolol tartrate  12.5 mg Oral BID   Or  . metoprolol tartrate  12.5 mg Per Tube BID  . mometasone-formoterol  2 puff Inhalation BID  . pantoprazole  40 mg Oral Daily  . potassium chloride  20 mEq Oral Q4H  . sodium chloride flush  10-40 mL Intracatheter Q12H  . sodium chloride flush  3 mL Intravenous Q12H   Continuous Infusions: . sodium chloride Stopped (01/15/19 0847)  . sodium chloride    . sodium chloride 10 mL/hr at 01/15/19 0136  . dexmedetomidine (PRECEDEX) IV infusion Stopped (01/15/19 0227)  . DOPamine Stopped (01/14/19 2234)  . insulin Stopped (  01/15/19 0850)  . lactated ringers    . lactated ringers Stopped (01/15/19 2135)  . nitroGLYCERIN Stopped (01/15/19 0021)  . phenylephrine (NEO-SYNEPHRINE) Adult infusion Stopped (01/15/19 0853)   PRN Meds:.sodium chloride, metoprolol tartrate, midazolam, morphine injection, ondansetron (ZOFRAN) IV, oxyCODONE, sodium chloride flush, sodium chloride flush, traMADol  Xrays Dg Chest Port 1 View  Result Date: 01/17/2019 CLINICAL DATA:  78 year old male post chest tube removal. Post CABG. Sore chest. Subsequent encounter. EXAM: PORTABLE CHEST 1 VIEW COMPARISON:  01/16/2019. FINDINGS: Left-sided chest tube and right introducer catheter have been removed. Slight increase in size of left apical pneumothorax (approximately 5-10%). Consolidation right thorax layering posteriorly  suggestive of pleural effusion possibly with right base atelectasis. Can not exclude right lower lobe infiltrate. Medial left base subsegmental atelectasis. Post CABG. Slightly prominent cardiac silhouette. No acute osseous abnormality. IMPRESSION: 1. Left-sided chest tube has been removed. Slight increase in size of left apical pneumothorax (approximately 5-10%). 2. Consolidation right thorax layering posteriorly suggestive of pleural effusion possibly with right base atelectasis. Can not exclude right lower lobe infiltrate. Medial left base subsegmental atelectasis. 3. Post CABG.  Slightly prominent cardiac silhouette. Electronically Signed   By: Genia Del M.D.   On: 01/17/2019 07:45   Dg Chest Port 1 View  Result Date: 01/16/2019 CLINICAL DATA:  Postop evaluation EXAM: PORTABLE CHEST 1 VIEW COMPARISON:  Yesterday FINDINGS: Stable left chest tube positioning. Mild increase in still small left apical pneumothorax which is now 2 posterior rib interspaces in height. Swan-Ganz catheter has been removed. Increasing hazy opacity on the right attributed to pleural fluid and atelectasis. Milder atelectasis at the left base. Stable postoperative heart size. IMPRESSION: 1. Mild increase in still small left pneumothorax. 2. Increased pleural fluid and/or atelectasis at the right base. Electronically Signed   By: Monte Fantasia M.D.   On: 01/16/2019 07:16    Assessment/Plan: S/P Procedure(s) (LRB): Emergency Coronary Artery Bypass Grafting (CABG)  times three using left internal mammary artery to LAD, reversed saphenous vein graft to OM and RCA (N/A) TRANSESOPHAGEAL ECHOCARDIOGRAM (TEE) (N/A) ENDOVEIN HARVEST OF GREATER SAPHENOUS VEIN (Right)  1 conts to do well 2 hemodyn stable in sinus rhythm with pvc's- increase beta blocker dose BP reasonably well controlled , should tol low dose ACE-I- will add 3 mild volume overload-add lasix for a few days 4 sats ok on 2l, wean- routine pulm toilet 5 H/H is  stable, Acute blood loss anemia- monitor 6 thrombocytopenia trend improving- follow 7 no leukocytosis 8 creat trend up a little to 1.29- monitor closely on lasix 9 CXR- mod right effusion- monitor clinically, hopefully wont need tap 10 routine cardiac rehab 11 tx to 4e     LOS: 3 days    Anthony Giovanni PA-C 01/17/2019 Pager [phone]  Feels well, ambulating some pvs no a fib Consider d/c in am , follow up pa lat chest xray Mild diuresis I have seen and examined Anthony Morrison and agree with the above assessment  and plan.  Grace Isaac MD Beeper [phone] Office [phone] 01/17/2019 9:04 AM

## 2019-01-17 NOTE — Progress Notes (Signed)
[facility] BRIEF SICU PROGRESS NOTE  3 Days Post-Op  S/P Procedure(s) (LRB): Emergency Coronary Artery Bypass Grafting (CABG)  times three using left internal mammary artery to LAD, reversed saphenous vein graft to OM and RCA (N/A) TRANSESOPHAGEAL ECHOCARDIOGRAM (TEE) (N/A) ENDOVEIN HARVEST OF GREATER SAPHENOUS VEIN (Right)   Stable day  Plan: Continue current plan  Rexene Alberts, MD 01/17/2019 5:16 PM

## 2019-01-17 NOTE — Progress Notes (Signed)
CARDIAC REHAB PHASE I   PRE:  Rate/Rhythm: 101 ST  BP:  Supine: 140/92  Sitting:   Standing:    SaO2: 98%RA  MODE:  Ambulation: 370 ft   POST:  Rate/Rhythm: 110 ST  BP:  Supine:   Sitting: 156/78  Standing:    SaO2: 93-95%RA 1415-1452 Discussed CRP 2 with pt and will send letter of interest to [facility]. Cannot write order for Dr. Nehemiah Massed. Pt walked 370 ft on RA with rolling walker and little assistance. Encouraged pt not to use arms and to stay in the tube adhering to sternal precautions. Pt thinks he can borrow rollator from friend. Son to check.  Had pt stop once and checked sats which were at 93-95% on RA. Back to recliner after walk with family in room. Discussed with family that we would complete ed tomorrow.  Pt did not want to go farther this walk.    Graylon Good, RN BSN  01/17/2019 2:48 PM

## 2019-01-18 ENCOUNTER — Inpatient Hospital Stay (HOSPITAL_COMMUNITY): Payer: PPO

## 2019-01-18 LAB — CBC
HCT: 29.5 % — ABNORMAL LOW (ref 39.0–52.0)
Hemoglobin: 9.9 g/dL — ABNORMAL LOW (ref 13.0–17.0)
MCH: 31 pg (ref 26.0–34.0)
MCHC: 33.6 g/dL (ref 30.0–36.0)
MCV: 92.5 fL (ref 80.0–100.0)
Platelets: 146 10*3/uL — ABNORMAL LOW (ref 150–400)
RBC: 3.19 MIL/uL — ABNORMAL LOW (ref 4.22–5.81)
RDW: 13.3 % (ref 11.5–15.5)
WBC: 9.6 10*3/uL (ref 4.0–10.5)
nRBC: 0 % (ref 0.0–0.2)

## 2019-01-18 LAB — GLUCOSE, CAPILLARY
Glucose-Capillary: 84 mg/dL (ref 70–99)
Glucose-Capillary: 87 mg/dL (ref 70–99)

## 2019-01-18 LAB — BASIC METABOLIC PANEL
Anion gap: 12 (ref 5–15)
BUN: 15 mg/dL (ref 8–23)
CO2: 23 mmol/L (ref 22–32)
Calcium: 8 mg/dL — ABNORMAL LOW (ref 8.9–10.3)
Chloride: 101 mmol/L (ref 98–111)
Creatinine, Ser: 1.2 mg/dL (ref 0.61–1.24)
GFR calc Af Amer: >60 mL/min (ref >60)
GFR calc non Af Amer: 58 mL/min — ABNORMAL LOW (ref >60)
Glucose, Bld: 94 mg/dL (ref 70–99)
Potassium: 3.7 mmol/L (ref 3.5–5.1)
Sodium: 136 mmol/L (ref 135–145)

## 2019-01-18 MED ORDER — ATORVASTATIN CALCIUM 20 MG PO TABS: 20.0000 mg | ORAL_TABLET | Freq: Every day | ORAL | 1 refills | Status: DC

## 2019-01-18 MED ORDER — POTASSIUM CHLORIDE CRYS ER 20 MEQ PO TBCR: 20.0000 meq | EXTENDED_RELEASE_TABLET | ORAL | Status: DC | PRN

## 2019-01-18 MED ORDER — POTASSIUM CHLORIDE ER 10 MEQ PO TBCR: 10.0000 meq | EXTENDED_RELEASE_TABLET | Freq: Every day | ORAL | 0 refills | Status: DC

## 2019-01-18 MED ORDER — FLUTICASONE-SALMETEROL 250-50 MCG/DOSE IN AEPB: 1.0000 | INHALATION_SPRAY | Freq: Two times a day (BID) | RESPIRATORY_TRACT | 1 refills | Status: DC

## 2019-01-18 MED ORDER — ASPIRIN 325 MG PO TBEC: 325.0000 mg | DELAYED_RELEASE_TABLET | Freq: Every day | ORAL | Status: DC

## 2019-01-18 MED ORDER — LISINOPRIL 10 MG PO TABS: 10.0000 mg | ORAL_TABLET | Freq: Every day | ORAL | 1 refills | Status: DC

## 2019-01-18 MED ORDER — OXYCODONE HCL 5 MG PO TABS: 5.0000 mg | ORAL_TABLET | Freq: Four times a day (QID) | ORAL | 0 refills | Status: AC | PRN

## 2019-01-18 MED ORDER — METOPROLOL TARTRATE 25 MG PO TABS: 25.0000 mg | ORAL_TABLET | Freq: Two times a day (BID) | ORAL | 1 refills | Status: DC

## 2019-01-18 MED ORDER — FUROSEMIDE 20 MG PO TABS: 20.0000 mg | ORAL_TABLET | Freq: Every day | ORAL | 0 refills | Status: DC

## 2019-01-18 MED FILL — Sodium Bicarbonate IV Soln 8.4%: INTRAVENOUS | Qty: 50 | Status: AC

## 2019-01-18 MED FILL — Sodium Chloride IV Soln 0.9%: INTRAVENOUS | Qty: 2000 | Status: AC

## 2019-01-18 MED FILL — Mannitol IV Soln 20%: INTRAVENOUS | Qty: 500 | Status: AC

## 2019-01-18 MED FILL — Potassium Chloride Inj 2 mEq/ML: INTRAVENOUS | Qty: 40 | Status: AC

## 2019-01-18 MED FILL — Heparin Sodium (Porcine) Inj 1000 Unit/ML: INTRAMUSCULAR | Qty: 30 | Status: AC

## 2019-01-18 MED FILL — Heparin Sodium (Porcine) Inj 1000 Unit/ML: INTRAMUSCULAR | Qty: 10 | Status: AC

## 2019-01-18 MED FILL — Electrolyte-R (PH 7.4) Solution: INTRAVENOUS | Qty: 5000 | Status: AC

## 2019-01-18 MED FILL — Lidocaine HCl(Cardiac) IV PF Soln Pref Syr 100 MG/5ML (2%): INTRAVENOUS | Qty: 5 | Status: AC

## 2019-01-18 MED FILL — Magnesium Sulfate Inj 50%: INTRAMUSCULAR | Qty: 10 | Status: AC

## 2019-01-18 MED FILL — Cefuroxime Sodium For Inj 750 MG: INTRAMUSCULAR | Qty: 750 | Status: AC

## 2019-01-18 MED FILL — Albumin, Human Inj 5%: INTRAVENOUS | Qty: 250 | Status: AC

## 2019-01-18 NOTE — Consult Note (Signed)
[facility] CM Primary Care Navigator  01/18/2019  Anthony Morrison 11-14-1941 [id number]   Met with patient, wife Anthony Morrison) and son Anthony Morrison) at the bedside to identify possible discharge needs.  Patient reportsthat he was transferred from [facility] following cardiac catheterization after he was seen by general surgery due to a umbilical hernia that was bothering him. He was found to have a 99% left main obstruction as well as three-vessel coronary artery disease. 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   [facility] [place] [facility] [place] [facility] [place].   Patientstatesthathe hasbeenmanaginghis medications at home ("barely taking any") straight out of the containers.   Patientverbalizedthat he has been driving prior to admission/ surgerybut wifeor son will be able toprovide transportation to his doctors'appointmentsafter discharge.  Patientreportslivingwith wife who will serve as his primary caregiverat home and son (lives nearby) will able to provide assistance when needed.  Anticipated discharge plan ishomewith cardiac rehabafter few weeks,per patient's son.  Patientand familyvoiced understanding to call primary care provider's office after discharge, for a post discharge follow-up appointment within1- 2 weeks orsooner if needs arise. Patient letter (with PCP's contact number) wasprovided asa reminder.   Discussed with patientand familyregarding [facility] services available for health management andresourcesat home but have denied any current or pressing needs at this time. Patient was encouraged to discuss with primary care provider on his next visit,about further needsand  assistancein managing his health conditions at home. Patientand familyexpressed understanding of needto seekreferral from primary care provider to [facility] care management ifdeemed necessary and appropriate for any services in the nearfuture.  [facility] care management information was provided for futureneeds thatpatientmay have.  Patienthowever, verbally agreedand optedfor EMMI calls to follow-up withhisrecovery at home after surgery.   Referral made for [facility] General calls after discharge.    For additional questions please contact:  Edwena Felty A. [place] Youngblood, BSN, RN-BC [facility] PRIMARY CARE Navigator Cell: [phone]

## 2019-01-18 NOTE — Progress Notes (Signed)
1020-1100 Education completed with pt, wife and son who voiced understanding. Stressed importance of adhering to sternal precautions and staying in the tube. Had pt demonstrate use of IS and he could get 1250 ml correctly. Encouraged walking and IS for pulmonary improvement. Gave walking instructions. Reviewed heart healthy food choices with wife asking pertinent questions. Sending referral letter of interest to [place]. Discussed incision care. Pt stated he has rollator at home for use. Discussed locking and trying to get against solid wall before sitting. Graylon Good RN BSN 01/18/2019 11:02 AM

## 2019-01-18 NOTE — Progress Notes (Signed)
Pt wheel out of unit by NT, reports pt was stable throughout transport. Last vital signs validated. No changes or complaints. Pt alert and oriented X4 prior to leaving unit. No other problems reported during transport.

## 2019-01-18 NOTE — Progress Notes (Addendum)
[address]       [place] [address]             [phone]      4 Days Post-Op Procedure(s) (LRB): Emergency Coronary Artery Bypass Grafting (CABG)  times three using left internal mammary artery to LAD, reversed saphenous vein graft to OM and RCA (N/A) TRANSESOPHAGEAL ECHOCARDIOGRAM (TEE) (N/A) ENDOVEIN HARVEST OF GREATER SAPHENOUS VEIN (Right) Subjective: Feels well, no new C/O, + BMx2  Objective: Vital signs in last 24 hours: Temp:  [98 F (36.7 C)-99 F (37.2 C)] 98.4 F (36.9 C) (01/28 0400) Pulse Rate:  [75-107] 97 (01/28 0600) Cardiac Rhythm: Normal sinus rhythm (01/28 0400) Resp:  [13-34] 15 (01/28 0600) BP: (90-160)/(45-143) 109/61 (01/28 0600) SpO2:  [51 %-100 %] 96 % (01/28 0737) Weight:  [74.6 kg] 74.6 kg (01/28 0500)  Hemodynamic parameters for last 24 hours:    Intake/Output from previous day: 01/27 0701 - 01/28 0700 In: 540 [P.O.:540] Out: 825 [Urine:825] Intake/Output this shift: No intake/output data recorded.  General appearance: alert, cooperative and no distress Heart: regular rate and rhythm and occ extrasystole Lungs: dim right base Abdomen: benign Extremities: trace edema Wound: incis healing well  Lab Results: Recent Labs    01/17/19 0338 01/18/19 0327  WBC 10.2 9.6  HGB 10.0* 9.9*  HCT 30.0* 29.5*  PLT 109* 146*   BMET:  Recent Labs    01/17/19 0338 01/18/19 0327  NA 136 136  K 3.6 3.7  CL 99 101  CO2 25 23  GLUCOSE 89 94  BUN 14 15  CREATININE 1.29* 1.20  CALCIUM 8.0* 8.0*    PT/INR: No results for input(s): LABPROT, INR in the last 72 hours. ABG    Component Value Date/Time   PHART 7.301 (L) 01/15/2019 0443   HCO3 23.6 01/15/2019 0443   TCO2 25 01/15/2019 0443   ACIDBASEDEF 3.0 (H) 01/15/2019 0443   O2SAT 99.0 01/15/2019 0443   CBG (last 3)  Recent Labs    01/17/19 2034 01/18/19 0017 01/18/19 0401  GLUCAP 97 87 84    Meds Scheduled Meds: . acetaminophen  1,000 mg Oral Q6H   Or  . acetaminophen (TYLENOL) oral liquid 160 mg/5 mL  1,000 mg Per Tube Q6H  . aspirin EC  325 mg Oral Daily   Or  . aspirin  324 mg Per Tube Daily  . atorvastatin  20 mg Oral q1800  . bisacodyl  10 mg Oral Daily   Or  . bisacodyl  10 mg Rectal Daily  . docusate sodium  200 mg Oral Daily  . enoxaparin (LOVENOX) injection  30 mg Subcutaneous QHS  . furosemide  40 mg Oral Daily  . insulin aspart  0-24 Units Subcutaneous Q4H  . levalbuterol  0.63 mg Nebulization BID  . lisinopril  10 mg Oral Daily  . metoprolol tartrate  25 mg Oral BID   Or  . metoprolol tartrate  12.5 mg Per Tube BID  . mometasone-formoterol  2 puff Inhalation BID  . pantoprazole  40 mg Oral Daily  . potassium chloride  20 mEq Oral Daily  . sodium chloride flush  3 mL Intravenous Q12H   Continuous Infusions: . sodium chloride     PRN Meds:.sodium chloride, lactulose, levalbuterol, metoprolol tartrate, ondansetron (ZOFRAN) IV, oxyCODONE, potassium chloride, sodium chloride flush, traMADol  Xrays Dg Chest 2 View  Result Date: 01/18/2019 CLINICAL DATA:  Followup thoracic surgery EXAM: CHEST - 2 VIEW COMPARISON:  Yesterday FINDINGS:  Unchanged small left apical pneumothorax. Improved right lower lobe aeration. Small pleural effusions and mild atelectasis. Stable postoperative heart size IMPRESSION: 1. Unchanged small left apical pneumothorax. 2. Improved right lower lobe aeration. 3. Small pleural effusions and mild atelectasis. Electronically Signed   By: Monte Fantasia M.D.   On: 01/18/2019 05:38   Dg Chest Port 1 View  Result Date: 01/17/2019 CLINICAL DATA:  78 year old male post chest tube removal. Post CABG. Sore chest. Subsequent encounter. EXAM: PORTABLE CHEST 1 VIEW COMPARISON:  01/16/2019. FINDINGS: Left-sided chest tube and right introducer catheter have been removed. Slight increase in size of left apical pneumothorax (approximately 5-10%). Consolidation right thorax layering posteriorly suggestive of pleural  effusion possibly with right base atelectasis. Can not exclude right lower lobe infiltrate. Medial left base subsegmental atelectasis. Post CABG. Slightly prominent cardiac silhouette. No acute osseous abnormality. IMPRESSION: 1. Left-sided chest tube has been removed. Slight increase in size of left apical pneumothorax (approximately 5-10%). 2. Consolidation right thorax layering posteriorly suggestive of pleural effusion possibly with right base atelectasis. Can not exclude right lower lobe infiltrate. Medial left base subsegmental atelectasis. 3. Post CABG.  Slightly prominent cardiac silhouette. Electronically Signed   By: Genia Del M.D.   On: 01/17/2019 07:45    Assessment/Plan: S/P Procedure(s) (LRB): Emergency Coronary Artery Bypass Grafting (CABG)  times three using left internal mammary artery to LAD, reversed saphenous vein graft to OM and RCA (N/A) TRANSESOPHAGEAL ECHOCARDIOGRAM (TEE) (N/A) ENDOVEIN HARVEST OF GREATER SAPHENOUS VEIN (Right)  1 doing well, patient seen by Dr Servando Snare, stable for d/c 2 labs stable 3 CXR - improved aeration  LOS: 4 days    [name] PA-C 01/18/2019 Pager [phone]  Stable today, chest xray improved , small right effusion Plan d/c home today Wires out yesterday I have seen and examined Anthony Morrison and agree with the above assessment  and plan.  Grace Isaac MD Beeper [phone] Office [phone] 01/18/2019 9:30 AM

## 2019-01-18 NOTE — Discharge Instructions (Signed)
Coronary Artery Bypass Grafting, Care After   This sheet gives you information about how to care for yourself after your procedure. Your doctor may also give you more specific instructions. If you have problems or questions, contact your doctor. Follow these instructions at home: Medicines  Take over-the-counter and prescription medicines only as told by your doctor. Do not stop taking medicines or start any new medicines unless your doctor says it is okay.  If you were prescribed an antibiotic medicine, take it as told by your doctor. Do not stop taking the antibiotic even if you start to feel better.  Do not drive or use heavy machinery while taking prescription pain medicine. Incision care      Follow instructions from your doctor about how to take care of your incisions. Make sure you: ? Wash your hands with soap and water before you change your bandage (dressing). If you cannot use soap and water, use hand sanitizer. ? Change your dressing as told by your doctor. ? Leave stitches (sutures), skin glue, or skin tape (adhesive) strips in place. They may need to stay in place for 2 weeks or longer. If tape strips get loose and curl up, you may trim the loose edges. Do not remove tape strips completely unless your doctor says it is okay.  Make sure the incisions are clean, dry, and protected.  Check your incision areas every day for signs of infection. Check for: ? Redness, swelling, or pain. ? Fluid or blood. ? Warmth. ? Pus or a bad smell.  If cuts were made in your legs: ? Avoid crossing your legs. ? Avoid sitting for long periods of time. Change positions every 30 minutes. ? Raise (elevate) your legs when you are sitting. Bathing  Do not take baths, swim, or use a hot tub until your doctor says it is okay.  Only take sponge baths. Pat the incisions dry. Do not rub the incisions to dry.  You may shower. Eating and drinking  Eat foods that are high in fiber, such as raw  fruits and vegetables, whole grains, beans, and nuts. Meats should be lean cut. Avoid canned, processed, and fried foods. This can help prevent constipation. This is also a recommended part of a heart-healthy diet.  Drink enough fluid to keep your urine clear or pale yellow.  Limit alcohol intake to no more than 1 drink a day for nonpregnant women and 2 drinks a day for men. One drink equals 12 oz of beer, 5 oz of wine, or 1 oz of hard liquor. Activity  Rest and limit your activity as told by your doctor. You may be told to: ? Stop any activity right away if you have chest pain, shortness of breath, irregular heartbeats, or dizziness. Get help right away if you have any of these symptoms. ? Move around often for short periods or take short walks as told by your doctor. Slowly increase your activities. You may need physical therapy or cardiac rehabilitation. ? Avoid lifting, pushing, or pulling anything that is heavier than 10 lb (4.5 kg) for at least 6 weeks or as told by your doctor.  Do not drive until your doctor says it is okay.  Ask your doctor when you can go back to work.  Ask your doctor when you can be sexually active. General instructions   Do not use any products that contain nicotine or tobacco, such as cigarettes and e-cigarettes. If you need help quitting, ask your doctor.  Take 2-3  deep breaths every few hours during the day while you get better. This helps expand your lungs and prevent complications.  If you were given a device called an incentive spirometer, use it several times a day to practice deep breathing. Support your chest with a pillow or your arms when you take deep breaths or cough.  Wear compression stockings as told by your doctor. These stockings help to prevent blood clots and reduce swelling in your legs.  Weigh yourself every day. This helps to see if your body is holding (retaining) fluid that may make your heart and lungs work harder.  Keep all  follow-up visits as told by your doctor. This is important. Contact a doctor if:  You have more redness, swelling, or pain around any cut.  You have more fluid or blood coming from any cut.  Any cut feels warm to the touch.  You have pus or a bad smell coming from any cut.  You have a fever.  You have swelling in your ankles or legs.  You have pain in your legs.  You gain 3 or more pounds (0.9 kg) a day.  You feel sick to your stomach (nauseous) or throw up (vomit).  You have watery poop (diarrhea). Get help right away if:  You have chest pain that goes to your jaw or arms.  You are short of breath.  You have a fast or irregular heartbeat.  You notice a "clicking" in your breastbone (sternum) when you move.  You feel numb or weak in your arms or legs.  You feel dizzy or light-headed. Summary  After the procedure, it is common to have pain or discomfort in the incision areas.  Do not take baths, swim, or use a hot tub until your health care provider approves.  Slowly increase your activities. You may need physical therapy or cardiac rehabilitation.  Weigh yourself every day. This helps to see if your body is holding (retaining) fluid that may make your heart and lungs work harder. This information is not intended to replace advice given to you by your health care provider. Make sure you discuss any questions you have with your health care provider. Document Released: 12/13/2013 Document Revised: 01/20/2017 Document Reviewed: 01/20/2017 Elsevier Interactive Patient Education  2019 [employer].

## 2019-01-21 DIAGNOSIS — I2510 Atherosclerotic heart disease of native coronary artery without angina pectoris: Secondary | ICD-10-CM | POA: Diagnosis not present

## 2019-01-21 DIAGNOSIS — R002 Palpitations: Secondary | ICD-10-CM | POA: Diagnosis not present

## 2019-01-21 DIAGNOSIS — I480 Paroxysmal atrial fibrillation: Secondary | ICD-10-CM | POA: Diagnosis not present

## 2019-01-25 DIAGNOSIS — R000 Tachycardia, unspecified: Secondary | ICD-10-CM | POA: Diagnosis not present

## 2019-01-25 DIAGNOSIS — K649 Unspecified hemorrhoids: Secondary | ICD-10-CM | POA: Diagnosis not present

## 2019-01-25 DIAGNOSIS — I2510 Atherosclerotic heart disease of native coronary artery without angina pectoris: Secondary | ICD-10-CM | POA: Diagnosis not present

## 2019-01-25 DIAGNOSIS — J431 Panlobular emphysema: Secondary | ICD-10-CM | POA: Diagnosis not present

## 2019-01-31 DIAGNOSIS — I471 Supraventricular tachycardia: Secondary | ICD-10-CM | POA: Diagnosis not present

## 2019-01-31 DIAGNOSIS — E782 Mixed hyperlipidemia: Secondary | ICD-10-CM | POA: Diagnosis not present

## 2019-01-31 DIAGNOSIS — I2510 Atherosclerotic heart disease of native coronary artery without angina pectoris: Secondary | ICD-10-CM | POA: Diagnosis not present

## 2019-01-31 DIAGNOSIS — I10 Essential (primary) hypertension: Secondary | ICD-10-CM | POA: Diagnosis not present

## 2019-01-31 DIAGNOSIS — I6523 Occlusion and stenosis of bilateral carotid arteries: Secondary | ICD-10-CM | POA: Diagnosis not present

## 2019-02-01 DIAGNOSIS — I6523 Occlusion and stenosis of bilateral carotid arteries: Secondary | ICD-10-CM | POA: Insufficient documentation

## 2019-02-01 DIAGNOSIS — R002 Palpitations: Secondary | ICD-10-CM | POA: Diagnosis not present

## 2019-02-10 ENCOUNTER — Other Ambulatory Visit: Payer: Self-pay | Admitting: *Deleted

## 2019-02-10 DIAGNOSIS — Z951 Presence of aortocoronary bypass graft: Secondary | ICD-10-CM

## 2019-02-14 ENCOUNTER — Ambulatory Visit (INDEPENDENT_AMBULATORY_CARE_PROVIDER_SITE_OTHER): Payer: Self-pay | Admitting: Surgical

## 2019-02-14 ENCOUNTER — Ambulatory Visit
Admission: RE | Admit: 2019-02-14 | Discharge: 2019-02-14 | Disposition: A | Discharge status: Home / Self Care | Payer: PPO | Source: Ambulatory Visit | Attending: Cardiothoracic Surgery | Admitting: Cardiothoracic Surgery

## 2019-02-14 VITALS — BP 140/72 | HR 60 | Resp 20 | Ht 66.0 in | Wt 150.0 lb

## 2019-02-14 DIAGNOSIS — J90 Pleural effusion, not elsewhere classified: Secondary | ICD-10-CM | POA: Diagnosis not present

## 2019-02-14 DIAGNOSIS — I2510 Atherosclerotic heart disease of native coronary artery without angina pectoris: Secondary | ICD-10-CM

## 2019-02-14 DIAGNOSIS — Z951 Presence of aortocoronary bypass graft: Secondary | ICD-10-CM

## 2019-02-14 NOTE — Patient Instructions (Signed)
Discussed lifestyle management including nutrition. Discussed activity progression including driving

## 2019-02-14 NOTE — Progress Notes (Signed)
[address]       [place] [address]             [phone]      Anthony Morrison Medical Record #[id number] Date of Birth: [birth year]  Referring: Rusty Aus, MD Primary Care: Rusty Aus, MD Primary Cardiologist: No primary care provider on file.   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  SURGICAL PROCEDURE:  Emergency coronary artery bypass grafting x3 with the left internal mammary to the left anterior descending coronary artery, reverse saphenous vein graft to the obtuse marginal coronary artery, reverse saphenous vein graft to the  posterior descending coronary artery with right thigh greater saphenous endoscopic vein harvesting.  SURGEON:  Lanelle Bal, MD  FIRST ASSISTANT:  Ellwood Handler, PA-C  History of Present Illness:    The patient is a 78 year old male status post the above described procedure seen in the office on today's date and routine postsurgical follow-up.  Overall he reports that he is feeling well.  His energy level is slow to increase.  But he definitely feels improved over time.  Denies fevers, chills or other significant constitutional symptoms.  He did have some difficulty postoperatively with hemorrhoids and his primary care physician lowered his aspirin dose to 81 mg daily due to bleeding.  He has also been seen by his cardiologist Dr. Serafina Royals who agreed with that dosing.  He did become more anemic from the hemorrhoidal bleeding and has been started on iron supplement.  Additionally he reports that he had some tachypalpitations and was found by Dr. Nehemiah Massed to be in atrial flutter.  His metoprolol dose was increased to 50 twice daily and he has had  resolution of those symptoms.  He does have a follow-up appointment scheduled with cardiology.  He is scheduled to start cardiac rehab this week as well.      Past Medical History:  Diagnosis Date  . Arthritis   . Bronchitis   . Cancer ([facility])    SKIN CANCERS  . COPD (chronic obstructive pulmonary disease) ([facility])    MILD PER PT  . Pneumonia 2016     Social History   Tobacco Use  Smoking Status Former Smoker  . Packs/day: 1.00  . Years: 50.00  . Pack years: 50.00  . Types: Cigarettes  . Last attempt to quit: 01/06/2015  . Years since quitting: 4.1  Smokeless Tobacco Never Used    Social History   Substance and Sexual Activity  Alcohol Use Yes   Comment: OCC      Allergies  Allergen Reactions  . Sulfa Antibiotics Other (See Comments)    Unknown    Current Outpatient Medications  Medication Sig Dispense Refill  . aspirin EC 325 MG EC tablet Take 1 tablet (325 mg total) by mouth daily. (Patient taking differently: Take 81 mg by mouth daily. )    . atorvastatin (LIPITOR) 20 MG tablet Take 1 tablet (20 mg total) by mouth daily at 6 PM. 30 tablet 1  . Fluticasone-Salmeterol (ADVAIR DISKUS) 250-50 MCG/DOSE AEPB Inhale 1 puff into the lungs 2 times daily at 12 noon and 4 pm. 1 each 1  . lisinopril (PRINIVIL,ZESTRIL) 10 MG tablet Take 1 tablet (10 mg total) by mouth  daily. 30 tablet 1  . metoprolol tartrate (LOPRESSOR) 25 MG tablet Take 1 tablet (25 mg total) by mouth 2 (two) times daily. (Patient taking differently: Take 50 mg by mouth 2 (two) times daily. ) 60 tablet 1  . oxymetazoline (AFRIN) 0.05 % nasal spray Place 1 spray into both nostrils at bedtime.    . furosemide (LASIX) 20 MG tablet Take 1 tablet (20 mg total) by mouth daily for 7 days. 7 tablet 0  . potassium chloride (KLOR-CON 10) 10 MEQ tablet Take 1 tablet (10 mEq total) by mouth daily for 7 days. 7 tablet 0   No current facility-administered medications for this visit.        Physical Exam: BP 140/72    Pulse 60   Resp 20   Ht 5\' 6"  (1.676 m)   Wt 68 kg   BMI 24.21 kg/m   General appearance: alert, cooperative and no distress Heart: regular rate and rhythm Lungs: clear to auscultation bilaterally Abdomen: Benign exam Extremities: No edema Wound: Incisions all well-healed without evidence of infection   Diagnostic Studies & Laboratory data:     Recent Radiology Findings:   Dg Chest 2 View  Result Date: 02/14/2019 CLINICAL DATA:  Status post CABG.  No chest complaints. EXAM: CHEST - 2 VIEW COMPARISON:  Chest x-ray dated January 18, 2019. FINDINGS: The heart size and mediastinal contours are within normal limits. Prior CABG. Normal pulmonary vascularity. Decreasing now trace bilateral pleural effusions. Improved aeration of the right lower lobe. No consolidation or pneumothorax. No acute osseous abnormality. IMPRESSION: 1. Decreasing now trace bilateral pleural effusions with improved aeration of the right lower lobe. 2. Resolved left apical pneumothorax. Electronically Signed   By: Titus Dubin M.D.   On: 02/14/2019 13:25      Recent Lab Findings: Lab Results  Component Value Date   WBC 9.6 01/18/2019   HGB 9.9 (L) 01/18/2019   HCT 29.5 (L) 01/18/2019   PLT 146 (L) 01/18/2019   GLUCOSE 94 01/18/2019   ALT 19 01/14/2019   AST 18 01/14/2019   NA 136 01/18/2019   K 3.7 01/18/2019   CL 101 01/18/2019   CREATININE 1.20 01/18/2019   BUN 15 01/18/2019   CO2 23 01/18/2019   INR 1.73 01/14/2019      Assessment / Plan: The patient is doing well following his emergent coronary artery bypass grafting for critical left main and three-vessel coronary artery disease.  We discussed routine postoperative activity advancement including driving.  I have encouraged him to proceed with the cardiac rehab program.  We additionally did discuss some routine lifestyle and nutrition management.  His chest x-ray was reviewed and agree with above findings.  I made no new changes to his current  medical regimen.  We will see again in the office on a as needed basis for any surgically related needs or at request.          [name], PA-C 02/14/2019 1:35 PM

## 2019-02-21 ENCOUNTER — Encounter: Payer: Self-pay | Admitting: *Deleted

## 2019-02-21 ENCOUNTER — Encounter: Payer: PPO | Attending: Internal Medicine | Admitting: *Deleted

## 2019-02-21 VITALS — Ht 67.25 in | Wt 151.3 lb

## 2019-02-21 DIAGNOSIS — Z951 Presence of aortocoronary bypass graft: Secondary | ICD-10-CM | POA: Diagnosis not present

## 2019-02-21 DIAGNOSIS — I739 Peripheral vascular disease, unspecified: Secondary | ICD-10-CM | POA: Insufficient documentation

## 2019-02-21 DIAGNOSIS — Z0000 Encounter for general adult medical examination without abnormal findings: Secondary | ICD-10-CM | POA: Diagnosis not present

## 2019-02-21 DIAGNOSIS — Z87891 Personal history of nicotine dependence: Secondary | ICD-10-CM | POA: Insufficient documentation

## 2019-02-21 DIAGNOSIS — J449 Chronic obstructive pulmonary disease, unspecified: Secondary | ICD-10-CM | POA: Diagnosis not present

## 2019-02-21 DIAGNOSIS — Z7982 Long term (current) use of aspirin: Secondary | ICD-10-CM | POA: Insufficient documentation

## 2019-02-21 DIAGNOSIS — M1990 Unspecified osteoarthritis, unspecified site: Secondary | ICD-10-CM | POA: Insufficient documentation

## 2019-02-21 DIAGNOSIS — I6523 Occlusion and stenosis of bilateral carotid arteries: Secondary | ICD-10-CM | POA: Insufficient documentation

## 2019-02-21 DIAGNOSIS — Z85828 Personal history of other malignant neoplasm of skin: Secondary | ICD-10-CM | POA: Insufficient documentation

## 2019-02-21 DIAGNOSIS — Z79899 Other long term (current) drug therapy: Secondary | ICD-10-CM | POA: Diagnosis not present

## 2019-02-21 NOTE — Progress Notes (Signed)
Cardiac Individual Treatment Plan  Patient Details  Name: Anthony Morrison MRN: [id number] Date of Birth: 1941/12/03 Referring Provider:     Cardiac Rehab from 02/21/2019 in [facility] Cardiac and Pulmonary Rehab  Referring Provider  Emily Filbert MD [Attending Cardiologist: Dr. Serafina Royals      Initial Encounter Date:    Cardiac Rehab from 02/21/2019 in [facility] Cardiac and Pulmonary Rehab  Date  02/21/19      Visit Diagnosis: S/P CABG x 3  Patient's Home Medications on Admission:  Current Outpatient Medications:  .  aspirin EC 325 MG EC tablet, Take 1 tablet (325 mg total) by mouth daily. (Patient taking differently: Take 81 mg by mouth daily. ), Disp: , Rfl:  .  atorvastatin (LIPITOR) 20 MG tablet, Take 1 tablet (20 mg total) by mouth daily at 6 PM., Disp: 30 tablet, Rfl: 1 .  ferrous sulfate 325 (65 FE) MG tablet, Take 325 mg by mouth daily with breakfast., Disp: , Rfl:  .  Fluticasone-Salmeterol (ADVAIR DISKUS) 250-50 MCG/DOSE AEPB, Inhale 1 puff into the lungs 2 times daily at 12 noon and 4 pm., Disp: 1 each, Rfl: 1 .  lisinopril (PRINIVIL,ZESTRIL) 10 MG tablet, Take 1 tablet (10 mg total) by mouth daily., Disp: 30 tablet, Rfl: 1 .  metoprolol tartrate (LOPRESSOR) 25 MG tablet, Take 1 tablet (25 mg total) by mouth 2 (two) times daily. (Patient taking differently: Take 50 mg by mouth 2 (two) times daily. ), Disp: 60 tablet, Rfl: 1 .  oxymetazoline (AFRIN) 0.05 % nasal spray, Place 1 spray into both nostrils at bedtime., Disp: , Rfl:  .  furosemide (LASIX) 20 MG tablet, Take 1 tablet (20 mg total) by mouth daily for 7 days., Disp: 7 tablet, Rfl: 0 .  potassium chloride (KLOR-CON 10) 10 MEQ tablet, Take 1 tablet (10 mEq total) by mouth daily for 7 days., Disp: 7 tablet, Rfl: 0  Past Medical History: Past Medical History:  Diagnosis Date  . Arthritis   . Bronchitis   . Cancer ([place])    SKIN CANCERS  . COPD (chronic obstructive pulmonary disease) (HCC)    MILD PER PT  . Pneumonia 2016     Tobacco Use: Social History   Tobacco Use  Smoking Status Former Smoker  . Packs/day: 1.00  . Years: 50.00  . Pack years: 50.00  . Types: Cigarettes  . Last attempt to quit: 01/06/2015  . Years since quitting: 4.1  Smokeless Tobacco Never Used    Labs: Recent Chemical engineer    Labs for ITP Cardiac and Pulmonary Rehab Latest Ref Rng & Units 01/14/2019 01/14/2019 01/14/2019 01/15/2019 01/15/2019   PHART 7.350 - 7.450 7.393 7.289(L) 7.313(L) 7.295(L) 7.301(L)   PCO2ART 32.0 - 48.0 mmHg 40.4 49.1(H) 45.2 45.9 48.1(H)   HCO3 20.0 - 28.0 mmol/L 24.6 23.6 23.2 22.4 23.6   TCO2 22 - 32 mmol/L 26 25 25 24 25    ACIDBASEDEF 0.0 - 2.0 mmol/L - 3.0(H) 3.0(H) 4.0(H) 3.0(H)   O2SAT % 100.0 100.0 98.0 98.0 99.0       Exercise Target Goals: Exercise Program Goal: Individual exercise prescription set using results from initial 6 min walk test and THRR while considering  patient's activity barriers and safety.   Exercise Prescription Goal: Initial exercise prescription builds to 30-45 minutes a day of aerobic activity, 2-3 days per week.  Home exercise guidelines will be given to patient during program as part of exercise prescription that the participant will acknowledge.  Activity Barriers & Risk  Stratification: Activity Barriers & Cardiac Risk Stratification - 02/21/19 1232      Activity Barriers & Cardiac Risk Stratification   Activity Barriers  Incisional Pain;Shortness of Breath;Deconditioning;Muscular Weakness    Cardiac Risk Stratification  High       6 Minute Walk: 6 Minute Walk    Row Name 02/21/19 1511         6 Minute Walk   Phase  Initial     Distance  1015 feet     Walk Time  6 minutes     # of Rest Breaks  0     MPH  2.31     METS  1.92     RPE  13     Perceived Dyspnea   1     VO2 Peak  8.08     Symptoms  Yes (comment)     Comments  SOB     Resting HR  64 bpm     Resting BP  124/66     Resting Oxygen Saturation   99 %     Exercise Oxygen Saturation   during 6 min walk  99 %     Max Ex. HR  81 bpm     Max Ex. BP  150/64     2 Minute Post BP  134/56        Oxygen Initial Assessment:   Oxygen Re-Evaluation:   Oxygen Discharge (Final Oxygen Re-Evaluation):   Initial Exercise Prescription: Initial Exercise Prescription - 02/21/19 1500      Date of Initial Exercise RX and Referring Provider   Date  02/21/19    Referring Provider  Emily Filbert MD   Attending Cardiologist: Dr. Serafina Royals     Treadmill   MPH  1.5    Grade  0.5    Minutes  15    METs  2.25      Recumbant Bike   Level  2    RPM  50    Watts  6    Minutes  15    METs  2      NuStep   Level  1    SPM  80    Minutes  15    METs  2      Prescription Details   Frequency (times per week)  2    Duration  Progress to 30 minutes of continuous aerobic without signs/symptoms of physical distress      Intensity   THRR 40-80% of Max Heartrate  96-127    Ratings of Perceived Exertion  11-13    Perceived Dyspnea  0-4      Progression   Progression  Continue to progress workloads to maintain intensity without signs/symptoms of physical distress.      Resistance Training   Training Prescription  Yes    Weight  3 lbs    Reps  10-15       Perform Capillary Blood Glucose checks as needed.  Exercise Prescription Changes: Exercise Prescription Changes    Row Name 02/21/19 1200             Response to Exercise   Blood Pressure (Admit)  124/66       Blood Pressure (Exercise)  150/64       Blood Pressure (Exit)  134/56       Heart Rate (Admit)  64 bpm       Heart Rate (Exercise)  92 bpm       Heart Rate (  Exit)  61 bpm       Oxygen Saturation (Admit)  99 %       Oxygen Saturation (Exercise)  99 %       Rating of Perceived Exertion (Exercise)  11       Perceived Dyspnea (Exercise)  1       Symptoms  SOB       Comments  walk test results          Exercise Comments:   Exercise Goals and Review: Exercise Goals    Row Name 02/21/19 1514              Exercise Goals   Increase Physical Activity  Yes       Intervention  Provide advice, education, support and counseling about physical activity/exercise needs.;Develop an individualized exercise prescription for aerobic and resistive training based on initial evaluation findings, risk stratification, comorbidities and participant's personal goals.       Expected Outcomes  Short Term: Attend rehab on a regular basis to increase amount of physical activity.;Long Term: Add in home exercise to make exercise part of routine and to increase amount of physical activity.;Long Term: Exercising regularly at least 3-5 days a week.       Increase Strength and Stamina  Yes       Intervention  Provide advice, education, support and counseling about physical activity/exercise needs.;Develop an individualized exercise prescription for aerobic and resistive training based on initial evaluation findings, risk stratification, comorbidities and participant's personal goals.       Expected Outcomes  Short Term: Increase workloads from initial exercise prescription for resistance, speed, and METs.;Short Term: Perform resistance training exercises routinely during rehab and add in resistance training at home;Long Term: Improve cardiorespiratory fitness, muscular endurance and strength as measured by increased METs and functional capacity (6MWT)       Able to understand and use rate of perceived exertion (RPE) scale  Yes       Intervention  Provide education and explanation on how to use RPE scale       Expected Outcomes  Short Term: Able to use RPE daily in rehab to express subjective intensity level;Long Term:  Able to use RPE to guide intensity level when exercising independently       Knowledge and understanding of Target Heart Rate Range (THRR)  Yes       Intervention  Provide education and explanation of THRR including how the numbers were predicted and where they are located for reference       Expected  Outcomes  Short Term: Able to state/look up THRR;Short Term: Able to use daily as guideline for intensity in rehab;Long Term: Able to use THRR to govern intensity when exercising independently       Able to check pulse independently  Yes       Intervention  Provide education and demonstration on how to check pulse in carotid and radial arteries.;Review the importance of being able to check your own pulse for safety during independent exercise       Expected Outcomes  Short Term: Able to explain why pulse checking is important during independent exercise;Long Term: Able to check pulse independently and accurately       Understanding of Exercise Prescription  Yes       Intervention  Provide education, explanation, and written materials on patient's individual exercise prescription       Expected Outcomes  Short Term: Able to explain program exercise prescription;Long Term: Able  to explain home exercise prescription to exercise independently          Exercise Goals Re-Evaluation :   Discharge Exercise Prescription (Final Exercise Prescription Changes): Exercise Prescription Changes - 02/21/19 1200      Response to Exercise   Blood Pressure (Admit)  124/66    Blood Pressure (Exercise)  150/64    Blood Pressure (Exit)  134/56    Heart Rate (Admit)  64 bpm    Heart Rate (Exercise)  92 bpm    Heart Rate (Exit)  61 bpm    Oxygen Saturation (Admit)  99 %    Oxygen Saturation (Exercise)  99 %    Rating of Perceived Exertion (Exercise)  11    Perceived Dyspnea (Exercise)  1    Symptoms  SOB    Comments  walk test results       Nutrition:  Target Goals: Understanding of nutrition guidelines, daily intake of sodium <1524m, cholesterol <2041m calories 30% from fat and 7% or less from saturated fats, daily to have 5 or more servings of fruits and vegetables.  Biometrics: Pre Biometrics - 02/21/19 1515      Pre Biometrics   Height  5' 7.25" (1.708 m)    Weight  151 lb 4.8 oz (68.6 kg)     Waist Circumference  35 inches    Hip Circumference  36.5 inches    Waist to Hip Ratio  0.96 %    BMI (Calculated)  23.53    Single Leg Stand  30 seconds        Nutrition Therapy Plan and Nutrition Goals: Nutrition Therapy & Goals - 02/21/19 1240      Intervention Plan   Intervention  Prescribe, educate and counsel regarding individualized specific dietary modifications aiming towards targeted core components such as weight, hypertension, lipid management, diabetes, heart failure and other comorbidities.    Expected Outcomes  Short Term Goal: Understand basic principles of dietary content, such as calories, fat, sodium, cholesterol and nutrients.;Short Term Goal: A plan has been developed with personal nutrition goals set during dietitian appointment.;Long Term Goal: Adherence to prescribed nutrition plan.       Nutrition Assessments: Nutrition Assessments - 02/21/19 1241      MEDFICTS Scores   Pre Score  30       Nutrition Goals Re-Evaluation:   Nutrition Goals Discharge (Final Nutrition Goals Re-Evaluation):   Psychosocial: Target Goals: Acknowledge presence or absence of significant depression and/or stress, maximize coping skills, provide positive support system. Participant is able to verbalize types and ability to use techniques and skills needed for reducing stress and depression.   Initial Review & Psychosocial Screening: Initial Psych Review & Screening - 02/21/19 1236      Initial Review   Current issues with  Current Stress Concerns    Source of Stress Concerns  Chronic Illness;Unable to participate in former interests or hobbies;Unable to perform yard/household activities   Post op still not able to do everything. Hopes to see improvement as he heals and is allowed to lift more and as he gets stronger     [place] Yes   Wife,children and  friends     Barriers   Psychosocial barriers to participate in program  There are no  identifiable barriers or psychosocial needs.;The patient should benefit from training in stress management and relaxation.      Screening Interventions   Interventions  Encouraged to exercise;To provide support and resources with  identified psychosocial needs;Provide feedback about the scores to participant    Expected Outcomes  Short Term goal: Utilizing psychosocial counselor, staff and physician to assist with identification of specific Stressors or current issues interfering with healing process. Setting desired goal for each stressor or current issue identified.;Long Term Goal: Stressors or current issues are controlled or eliminated.;Long Term goal: The participant improves quality of Life and PHQ9 Scores as seen by post scores and/or verbalization of changes;Short Term goal: Identification and review with participant of any Quality of Life or Depression concerns found by scoring the questionnaire.       Quality of Life Scores:  Quality of Life - 02/21/19 1239      Quality of Life   Select  Quality of Life      Quality of Life Scores   Health/Function Pre  18.47 %    Socioeconomic Pre  23.17 %    Psych/Spiritual Pre  18.93 %    Family Pre  26.4 %    GLOBAL Pre  20.62 %      Scores of 19 and below usually indicate a poorer quality of life in these areas.  A difference of  2-3 points is a clinically meaningful difference.  A difference of 2-3 points in the total score of the Quality of Life Index has been associated with significant improvement in overall quality of life, self-image, physical symptoms, and general health in studies assessing change in quality of life.  PHQ-9: Recent Review Flowsheet Data    Depression screen [facility] 2/9 02/21/2019   Decreased Interest 0   Down, Depressed, Hopeless 0   PHQ - 2 Score 0   Altered sleeping 0   Tired, decreased energy 1   Change in appetite 0   Feeling bad or failure about yourself  0   Trouble concentrating 0   Moving slowly or  fidgety/restless 0   Suicidal thoughts 0   PHQ-9 Score 1   Difficult doing work/chores Somewhat difficult     Interpretation of Total Score  Total Score Depression Severity:  1-4 = Minimal depression, 5-9 = Mild depression, 10-14 = Moderate depression, 15-19 = Moderately severe depression, 20-27 = Severe depression   Psychosocial Evaluation and Intervention:   Psychosocial Re-Evaluation:   Psychosocial Discharge (Final Psychosocial Re-Evaluation):   Vocational Rehabilitation: Provide vocational rehab assistance to qualifying candidates.   Vocational Rehab Evaluation & Intervention: Vocational Rehab - 02/21/19 1244      Initial Vocational Rehab Evaluation & Intervention   Assessment shows need for Vocational Rehabilitation  No       Education: Education Goals: Education classes will be provided on a variety of topics geared toward better understanding of heart health and risk factor modification. Participant will state understanding/return demonstration of topics presented as noted by education test scores.  Learning Barriers/Preferences: Learning Barriers/Preferences - 02/21/19 1242      Learning Barriers/Preferences   Learning Barriers  Exercise Concerns   Had bilateral carotid blockages. PMD has stated OK to do Cardiac Rehab   Learning Preferences  None       Education Topics:  AED/CPR: - Group verbal and written instruction with the use of models to demonstrate the basic use of the AED with the basic ABC's of resuscitation.   General Nutrition Guidelines/Fats and Fiber: -Group instruction provided by verbal, written material, models and posters to present the general guidelines for heart healthy nutrition. Gives an explanation and review of dietary fats and fiber.   Controlling Sodium/Reading Food Labels: -Group  verbal and written material supporting the discussion of sodium use in heart healthy nutrition. Review and explanation with models, verbal and written  materials for utilization of the food label.   Exercise Physiology & General Exercise Guidelines: - Group verbal and written instruction with models to review the exercise physiology of the cardiovascular system and associated critical values. Provides general exercise guidelines with specific guidelines to those with heart or lung disease.    Aerobic Exercise & Resistance Training: - Gives group verbal and written instruction on the various components of exercise. Focuses on aerobic and resistive training programs and the benefits of this training and how to safely progress through these programs..   Flexibility, Balance, Mind/Body Relaxation: Provides group verbal/written instruction on the benefits of flexibility and balance training, including mind/body exercise modes such as yoga, pilates and tai chi.  Demonstration and skill practice provided.   Stress and Anxiety: - Provides group verbal and written instruction about the health risks of elevated stress and causes of high stress.  Discuss the correlation between heart/lung disease and anxiety and treatment options. Review healthy ways to manage with stress and anxiety.   Depression: - Provides group verbal and written instruction on the correlation between heart/lung disease and depressed mood, treatment options, and the stigmas associated with seeking treatment.   Anatomy & Physiology of the Heart: - Group verbal and written instruction and models provide basic cardiac anatomy and physiology, with the coronary electrical and arterial systems. Review of Valvular disease and Heart Failure   Cardiac Procedures: - Group verbal and written instruction to review commonly prescribed medications for heart disease. Reviews the medication, class of the drug, and side effects. Includes the steps to properly store meds and maintain the prescription regimen. (beta blockers and nitrates)   Cardiac Medications I: - Group verbal and written  instruction to review commonly prescribed medications for heart disease. Reviews the medication, class of the drug, and side effects. Includes the steps to properly store meds and maintain the prescription regimen.   Cardiac Medications II: -Group verbal and written instruction to review commonly prescribed medications for heart disease. Reviews the medication, class of the drug, and side effects. (all other drug classes)    Go Sex-Intimacy & Heart Disease, Get SMART - Goal Setting: - Group verbal and written instruction through game format to discuss heart disease and the return to sexual intimacy. Provides group verbal and written material to discuss and apply goal setting through the application of the S.M.A.R.T. Method.   Other Matters of the Heart: - Provides group verbal, written materials and models to describe Stable Angina and Peripheral Artery. Includes description of the disease process and treatment options available to the cardiac patient.   Exercise & Equipment Safety: - Individual verbal instruction and demonstration of equipment use and safety with use of the equipment.   Cardiac Rehab from 02/21/2019 in [facility] Cardiac and Pulmonary Rehab  Date  02/21/19  Educator  [facility]  Instruction Review Code  1- Verbalizes Understanding      Infection Prevention: - Provides verbal and written material to individual with discussion of infection control including proper hand washing and proper equipment cleaning during exercise session.   Cardiac Rehab from 02/21/2019 in [facility] Cardiac and Pulmonary Rehab  Date  02/21/19  Educator  [facility]  Instruction Review Code  1- Verbalizes Understanding      Falls Prevention: - Provides verbal and written material to individual with discussion of falls prevention and safety.   Cardiac Rehab from  02/21/2019 in [facility] Cardiac and Pulmonary Rehab  Date  02/21/19  Educator  [facility]  Instruction Review Code  1- Verbalizes Understanding      Diabetes: -  Individual verbal and written instruction to review signs/symptoms of diabetes, desired ranges of glucose level fasting, after meals and with exercise. Acknowledge that pre and post exercise glucose checks will be done for 3 sessions at entry of program.   Know Your Numbers and Risk Factors: -Group verbal and written instruction about important numbers in your health.  Discussion of what are risk factors and how they play a role in the disease process.  Review of Cholesterol, Blood Pressure, Diabetes, and BMI and the role they play in your overall health.   Sleep Hygiene: -Provides group verbal and written instruction about how sleep can affect your health.  Define sleep hygiene, discuss sleep cycles and impact of sleep habits. Review good sleep hygiene tips.    Other: -Provides group and verbal instruction on various topics (see comments)   Knowledge Questionnaire Score: Knowledge Questionnaire Score - 02/21/19 1243      Knowledge Questionnaire Score   Pre Score  20/26   Reviewed correct responses with Simona Huh. He verbalized understanding of the responses and had no further questions today.       Core Components/Risk Factors/Patient Goals at Admission: Personal Goals and Risk Factors at Admission - 02/21/19 1244      Core Components/Risk Factors/Patient Goals on Admission    Weight Management  Yes;Weight Maintenance    Intervention  Weight Management: Develop a combined nutrition and exercise program designed to reach desired caloric intake, while maintaining appropriate intake of nutrient and fiber, sodium and fats, and appropriate energy expenditure required for the weight goal.;Obesity: Provide education and appropriate resources to help participant work on and attain dietary goals.    Admit Weight  151 lb 4.8 oz (68.6 kg)    Goal Weight: Short Term  151 lb 4.8 oz (68.6 kg)    Goal Weight: Long Term  151 lb 4.8 oz (68.6 kg)    Expected Outcomes  Short Term: Continue to assess and  modify interventions until short term weight is achieved;Long Term: Adherence to nutrition and physical activity/exercise program aimed toward attainment of established weight goal    Hypertension  Yes    Intervention  Provide education on lifestyle modifcations including regular physical activity/exercise, weight management, moderate sodium restriction and increased consumption of fresh fruit, vegetables, and low fat dairy, alcohol moderation, and smoking cessation.;Monitor prescription use compliance.    Expected Outcomes  Short Term: Continued assessment and intervention until BP is < 140/70m HG in hypertensive participants. < 130/83mHG in hypertensive participants with diabetes, heart failure or chronic kidney disease.;Long Term: Maintenance of blood pressure at goal levels.    Lipids  Yes    Intervention  Provide education and support for participant on nutrition & aerobic/resistive exercise along with prescribed medications to achieve LDL <7067mHDL >8m79m  Expected Outcomes  Short Term: Participant states understanding of desired cholesterol values and is compliant with medications prescribed. Participant is following exercise prescription and nutrition guidelines.;Long Term: Cholesterol controlled with medications as prescribed, with individualized exercise RX and with personalized nutrition plan. Value goals: LDL < 70mg77mL > 40 mg.       Core Components/Risk Factors/Patient Goals Review:    Core Components/Risk Factors/Patient Goals at Discharge (Final Review):    ITP Comments: ITP Comments    Row Name 02/21/19 1229  ITP Comments  Medical review completed. ITP sent to Dr Emily Filbert for review, changes as needed and signature. Documentation of diagnosis can be found in [facility] 01/14/2019          Comments: Initial ITP

## 2019-02-21 NOTE — Progress Notes (Signed)
Daily Session Note  Patient Details  Name: Anthony Morrison MRN: [id number] Date of Birth: [birth year] Referring Provider:     Cardiac Rehab from 02/21/2019 in [facility] Cardiac [facility] Pulmonary Rehab  Referring Provider  Emily Filbert MD [Attending Cardiologist: Dr. Serafina Royals      Encounter Date: 02/21/2019  Check In: Session Check In - 02/21/19 1230      Check-In   Supervising physician immediately available to respond to emergencies  See telemetry face sheet for immediately available MD    Location  [facility]-Cardiac & Pulmonary Rehab    Staff Present  Heath Lark, RN, BSN, CCRP;Meredith Sherryll Burger, RN BSN;Jessica Luan Pulling, MA, RCEP, CCRP, Exercise Physiologist    Resistance Training Performed  No   Medical review today   VAD Patient?  No    PAD/SET Patient?  No        Exercise Prescription Changes - 02/21/19 1200      Response to Exercise   Blood Pressure (Admit)  124/66    Blood Pressure (Exercise)  150/64    Blood Pressure (Exit)  134/56    Heart Rate (Admit)  64 bpm    Heart Rate (Exercise)  92 bpm    Heart Rate (Exit)  61 bpm    Oxygen Saturation (Admit)  99 %    Oxygen Saturation (Exercise)  99 %    Rating of Perceived Exertion (Exercise)  11    Perceived Dyspnea (Exercise)  1    Symptoms  SOB    Comments  walk test results       Social History   Tobacco Use  Smoking Status Former Smoker  . Packs/day: 1.00  . Years: 50.00  . Pack years: 50.00  . Types: Cigarettes  . Last attempt to quit: 01/06/2015  . Years since quitting: 4.1  Smokeless Tobacco Never Used    Goals Met:  Exercise tolerated well Personal goals reviewed No report of cardiac concerns or symptoms Strength training completed today  Goals Unmet:  Not Applicable  Comments: Medical evaluation and 6MWT completed today    Dr. Emily Filbert is Medical Director for [place] and [facility].

## 2019-02-21 NOTE — Patient Instructions (Signed)
Patient Instructions  Patient Details  Name: Anthony Morrison MRN: [id number] Date of Birth: Nov 07, 1941 Referring Provider:  Rusty Aus, MD  Below are your personal goals for exercise, nutrition, and risk factors. Our goal is to help you stay on track towards obtaining and maintaining these goals. We will be discussing your progress on these goals with you throughout the program.  Initial Exercise Prescription: Initial Exercise Prescription - 02/21/19 1500      Date of Initial Exercise RX and Referring Provider   Date  02/21/19    Referring Provider  Emily Filbert MD   Attending Cardiologist: Dr. Serafina Royals     Treadmill   MPH  1.5    Grade  0.5    Minutes  15    METs  2.25      Recumbant Bike   Level  2    RPM  50    Watts  6    Minutes  15    METs  2      NuStep   Level  1    SPM  80    Minutes  15    METs  2      Prescription Details   Frequency (times per week)  2    Duration  Progress to 30 minutes of continuous aerobic without signs/symptoms of physical distress      Intensity   THRR 40-80% of Max Heartrate  96-127    Ratings of Perceived Exertion  11-13    Perceived Dyspnea  0-4      Progression   Progression  Continue to progress workloads to maintain intensity without signs/symptoms of physical distress.      Resistance Training   Training Prescription  Yes    Weight  3 lbs    Reps  10-15       Exercise Goals: Frequency: Be able to perform aerobic exercise two to three times per week in program working toward 2-5 days per week of home exercise.  Intensity: Work with a perceived exertion of 11 (fairly light) - 15 (hard) while following your exercise prescription.  We will make changes to your prescription with you as you progress through the program.   Duration: Be able to do 30 to 45 minutes of continuous aerobic exercise in addition to a 5 minute warm-up and a 5 minute cool-down routine.   Nutrition Goals: Your personal nutrition goals  will be established when you do your nutrition analysis with the dietician.  The following are general nutrition guidelines to follow: Cholesterol < 200mg /day Sodium < 1500mg /day Fiber: Men over 50 yrs - 30 grams per day  Personal Goals: Personal Goals and Risk Factors at Admission - 02/21/19 1244      Core Components/Risk Factors/Patient Goals on Admission    Weight Management  Yes;Weight Maintenance    Intervention  Weight Management: Develop a combined nutrition and exercise program designed to reach desired caloric intake, while maintaining appropriate intake of nutrient and fiber, sodium and fats, and appropriate energy expenditure required for the weight goal.;Obesity: Provide education and appropriate resources to help participant work on and attain dietary goals.    Admit Weight  151 lb 4.8 oz (68.6 kg)    Goal Weight: Short Term  151 lb 4.8 oz (68.6 kg)    Goal Weight: Long Term  151 lb 4.8 oz (68.6 kg)    Expected Outcomes  Short Term: Continue to assess and modify interventions until short term weight is achieved;Long Term:  Adherence to nutrition and physical activity/exercise program aimed toward attainment of established weight goal    Hypertension  Yes    Intervention  Provide education on lifestyle modifcations including regular physical activity/exercise, weight management, moderate sodium restriction and increased consumption of fresh fruit, vegetables, and low fat dairy, alcohol moderation, and smoking cessation.;Monitor prescription use compliance.    Expected Outcomes  Short Term: Continued assessment and intervention until BP is < 140/62mm HG in hypertensive participants. < 130/51mm HG in hypertensive participants with diabetes, heart failure or chronic kidney disease.;Long Term: Maintenance of blood pressure at goal levels.    Lipids  Yes    Intervention  Provide education and support for participant on nutrition & aerobic/resistive exercise along with prescribed  medications to achieve LDL 70mg , HDL >40mg .    Expected Outcomes  Short Term: Participant states understanding of desired cholesterol values and is compliant with medications prescribed. Participant is following exercise prescription and nutrition guidelines.;Long Term: Cholesterol controlled with medications as prescribed, with individualized exercise RX and with personalized nutrition plan. Value goals: LDL < 70mg , HDL > 40 mg.       Tobacco Use Initial Evaluation: Social History   Tobacco Use  Smoking Status Former Smoker  . Packs/day: 1.00  . Years: 50.00  . Pack years: 50.00  . Types: Cigarettes  . Last attempt to quit: 01/06/2015  . Years since quitting: 4.1  Smokeless Tobacco Never Used    Exercise Goals and Review: Exercise Goals    Row Name 02/21/19 1514             Exercise Goals   Increase Physical Activity  Yes       Intervention  Provide advice, education, support and counseling about physical activity/exercise needs.;Develop an individualized exercise prescription for aerobic and resistive training based on initial evaluation findings, risk stratification, comorbidities and participant's personal goals.       Expected Outcomes  Short Term: Attend rehab on a regular basis to increase amount of physical activity.;Long Term: Add in home exercise to make exercise part of routine and to increase amount of physical activity.;Long Term: Exercising regularly at least 3-5 days a week.       Increase Strength and Stamina  Yes       Intervention  Provide advice, education, support and counseling about physical activity/exercise needs.;Develop an individualized exercise prescription for aerobic and resistive training based on initial evaluation findings, risk stratification, comorbidities and participant's personal goals.       Expected Outcomes  Short Term: Increase workloads from initial exercise prescription for resistance, speed, and METs.;Short Term: Perform resistance  training exercises routinely during rehab and add in resistance training at home;Long Term: Improve cardiorespiratory fitness, muscular endurance and strength as measured by increased METs and functional capacity (6MWT)       Able to understand and use rate of perceived exertion (RPE) scale  Yes       Intervention  Provide education and explanation on how to use RPE scale       Expected Outcomes  Short Term: Able to use RPE daily in rehab to express subjective intensity level;Long Term:  Able to use RPE to guide intensity level when exercising independently       Knowledge and understanding of Target Heart Rate Range (THRR)  Yes       Intervention  Provide education and explanation of THRR including how the numbers were predicted and where they are located for reference       Expected  Outcomes  Short Term: Able to state/look up THRR;Short Term: Able to use daily as guideline for intensity in rehab;Long Term: Able to use THRR to govern intensity when exercising independently       Able to check pulse independently  Yes       Intervention  Provide education and demonstration on how to check pulse in carotid and radial arteries.;Review the importance of being able to check your own pulse for safety during independent exercise       Expected Outcomes  Short Term: Able to explain why pulse checking is important during independent exercise;Long Term: Able to check pulse independently and accurately       Understanding of Exercise Prescription  Yes       Intervention  Provide education, explanation, and written materials on patient's individual exercise prescription       Expected Outcomes  Short Term: Able to explain program exercise prescription;Long Term: Able to explain home exercise prescription to exercise independently          Copy of goals given to participant.

## 2019-02-24 DIAGNOSIS — Z951 Presence of aortocoronary bypass graft: Secondary | ICD-10-CM | POA: Diagnosis not present

## 2019-02-24 NOTE — Progress Notes (Signed)
Daily Session Note  Patient Details  Name: Anthony Morrison MRN: [id number] Date of Birth: 06/07/1941 Referring Provider:     Cardiac Rehab from 02/21/2019 in [facility] Cardiac and Pulmonary Rehab  Referring Provider  Emily Filbert MD [Attending Cardiologist: Dr. Serafina Royals      Encounter Date: 02/24/2019  Check In: Session Check In - 02/24/19 0917      Check-In   Supervising physician immediately available to respond to emergencies  See telemetry face sheet for immediately available ER MD    Location  [facility]-Cardiac & Pulmonary Rehab    Staff Present  Alberteen Sam, MA, RCEP, CCRP, Exercise Physiologist;Carroll Enterkin, RN, BSN;Jeanna Durrell BS, Exercise Physiologist;Joseph Tessie Fass RCP,RRT,BSRT    Medication changes reported      No    Fall or balance concerns reported     No    Warm-up and Cool-down  Performed as group-led instruction    Resistance Training Performed  Yes    VAD Patient?  No    PAD/SET Patient?  No      Pain Assessment   Currently in Pain?  No/denies          Social History   Tobacco Use  Smoking Status Former Smoker  . Packs/day: 1.00  . Years: 50.00  . Pack years: 50.00  . Types: Cigarettes  . Last attempt to quit: 01/06/2015  . Years since quitting: 4.1  Smokeless Tobacco Never Used    Goals Met:  Exercise tolerated well Personal goals reviewed No report of cardiac concerns or symptoms Strength training completed today  Goals Unmet:  Not Applicable  Comments: First full day of exercise!  Patient was oriented to gym and equipment including functions, settings, policies, and procedures.  Patient's individual exercise prescription and treatment plan were reviewed.  All starting workloads were established based on the results of the 6 minute walk test done at initial orientation visit.  The plan for exercise progression was also introduced and progression will be customized based on patient's performance and goals.    Dr. Emily Filbert is Medical  Director for [place] and [facility].

## 2019-02-28 ENCOUNTER — Encounter (INDEPENDENT_AMBULATORY_CARE_PROVIDER_SITE_OTHER): Payer: Self-pay | Admitting: Vascular Surgery

## 2019-02-28 ENCOUNTER — Ambulatory Visit (INDEPENDENT_AMBULATORY_CARE_PROVIDER_SITE_OTHER): Payer: PPO | Admitting: Vascular Surgery

## 2019-02-28 ENCOUNTER — Other Ambulatory Visit: Payer: Self-pay

## 2019-02-28 VITALS — BP 139/72 | HR 97 | Resp 10 | Ht 66.0 in | Wt 152.0 lb

## 2019-02-28 DIAGNOSIS — I2510 Atherosclerotic heart disease of native coronary artery without angina pectoris: Secondary | ICD-10-CM | POA: Diagnosis not present

## 2019-02-28 DIAGNOSIS — I70213 Atherosclerosis of native arteries of extremities with intermittent claudication, bilateral legs: Secondary | ICD-10-CM | POA: Diagnosis not present

## 2019-02-28 DIAGNOSIS — Z7982 Long term (current) use of aspirin: Secondary | ICD-10-CM | POA: Diagnosis not present

## 2019-02-28 DIAGNOSIS — I6523 Occlusion and stenosis of bilateral carotid arteries: Secondary | ICD-10-CM | POA: Diagnosis not present

## 2019-02-28 DIAGNOSIS — Z7902 Long term (current) use of antithrombotics/antiplatelets: Secondary | ICD-10-CM | POA: Diagnosis not present

## 2019-02-28 DIAGNOSIS — Z79899 Other long term (current) drug therapy: Secondary | ICD-10-CM

## 2019-02-28 DIAGNOSIS — Z87891 Personal history of nicotine dependence: Secondary | ICD-10-CM

## 2019-02-28 DIAGNOSIS — I70219 Atherosclerosis of native arteries of extremities with intermittent claudication, unspecified extremity: Secondary | ICD-10-CM | POA: Insufficient documentation

## 2019-02-28 DIAGNOSIS — E782 Mixed hyperlipidemia: Secondary | ICD-10-CM

## 2019-02-28 DIAGNOSIS — I739 Peripheral vascular disease, unspecified: Secondary | ICD-10-CM | POA: Diagnosis not present

## 2019-02-28 DIAGNOSIS — I10 Essential (primary) hypertension: Secondary | ICD-10-CM | POA: Diagnosis not present

## 2019-02-28 NOTE — Progress Notes (Signed)
MRN : [id number]  Anthony Morrison is a 78 y.o. (10-13-1941) male who presents with chief complaint of  Chief Complaint  Patient presents with  . New Patient (Initial Visit)  .  History of Present Illness:   The patient is seen for evaluation of carotid stenosis. The carotid stenosis was identified after a duplex ultrasound was done preop CABG.  Also as part of the preop assessment ABI's were done.  The patient denies amaurosis fugax. There is no recent history of TIA symptoms or focal motor deficits. There is no prior documented CVA.  There is no history of migraine headaches. There is no history of seizures.   The patient is also seen for evaluation of painful lower extremities and diminished pulses. Patient notes the pain is always associated with activity and is very consistent day today. Typically, the pain occurs at less than one block, progress is as activity continues to the point that the patient must stop walking. Resting including standing still for several minutes allowed resumption of the activity and the ability to walk a similar distance before stopping again. Uneven terrain and inclined shorten the distance. The pain has been progressive over the past several years. The patient denies inability to walk as having a negative impact on quality of life and daily activities.  The patient denies rest pain or dangling of an extremity off the side of the bed during the night for relief. No open wounds or sores at this time. No prior interventions or surgeries.  The patient denies history of DVT, PE or superficial thrombophlebitis. The patient denies recent episodes of angina or shortness of breath.   The patient is taking enteric-coated aspirin 81 mg daily.  The patient has a history of coronary artery disease s/p recent CABG right GSV harvested, no recent episodes of angina or shortness of breath. There is a history of hyperlipidemia which is being treated with a statin.     Current Meds  Medication Sig  . aspirin EC 325 MG EC tablet Take 1 tablet (325 mg total) by mouth daily. (Patient taking differently: Take 81 mg by mouth daily. )  . atorvastatin (LIPITOR) 20 MG tablet Take 1 tablet (20 mg total) by mouth daily at 6 PM.  . ferrous sulfate 325 (65 FE) MG tablet Take 325 mg by mouth daily with breakfast.  . Fluticasone-Salmeterol (ADVAIR DISKUS) 250-50 MCG/DOSE AEPB Inhale 1 puff into the lungs 2 times daily at 12 noon and 4 pm.  . lisinopril (PRINIVIL,ZESTRIL) 10 MG tablet Take 1 tablet (10 mg total) by mouth daily.  . metoprolol tartrate (LOPRESSOR) 25 MG tablet Take 1 tablet (25 mg total) by mouth 2 (two) times daily. (Patient taking differently: Take 50 mg by mouth 2 (two) times daily. )    Past Medical History:  Diagnosis Date  . Arthritis   . Bronchitis   . Cancer ([place])    SKIN CANCERS  . COPD (chronic obstructive pulmonary disease) (HCC)    MILD PER PT  . Pneumonia 2016    Past Surgical History:  Procedure Laterality Date  . COLONOSCOPY  2015  . CORONARY ARTERY BYPASS GRAFT N/A 01/14/2019   Procedure: Emergency Coronary Artery Bypass Grafting (CABG)  times three using left internal mammary artery to LAD, reversed saphenous vein graft to OM and RCA;  Surgeon: Grace Isaac, MD;  Location: [place];  Service: Open Heart Surgery;  Laterality: N/A;  . ENDOVEIN HARVEST OF GREATER SAPHENOUS VEIN Right 01/14/2019  Procedure: ENDOVEIN HARVEST OF GREATER SAPHENOUS VEIN;  Surgeon: Grace Isaac, MD;  Location: [place];  Service: Open Heart Surgery;  Laterality: Right;  . EYE SURGERY     CATARACTS BIL  . LEFT HEART CATH AND CORONARY ANGIOGRAPHY Left 01/14/2019   Procedure: LEFT HEART CATH AND CORONARY ANGIOGRAPHY;  Surgeon: Corey Skains, MD;  Location: [facility];  Service: Cardiovascular;  Laterality: Left;  . TEE WITHOUT CARDIOVERSION N/A 01/14/2019   Procedure: TRANSESOPHAGEAL ECHOCARDIOGRAM (TEE);  Surgeon: Grace Isaac,  MD;  Location: [place];  Service: Open Heart Surgery;  Laterality: N/A;    Social History Social History   Tobacco Use  . Smoking status: Former Smoker    Packs/day: 1.00    Years: 50.00    Pack years: 50.00    Types: Cigarettes    Last attempt to quit: 01/06/2015    Years since quitting: 4.1  . Smokeless tobacco: Never Used  Substance Use Topics  . Alcohol use: Yes    Comment: OCC   . Drug use: Never    Family History No family history on file.  No family history of bleeding/clotting disorders, porphyria or autoimmune disease   Allergies  Allergen Reactions  . Sulfa Antibiotics Other (See Comments)    Unknown     REVIEW OF SYSTEMS (Negative unless checked)  Constitutional: [] Weight loss  [] Fever  [] Chills Cardiac: [] Chest pain   [] Chest pressure   [] Palpitations   [x] Shortness of breath when laying flat   [x] Shortness of breath with exertion. Vascular:  [] Pain in legs with walking   [] Pain in legs at rest  [] History of DVT   [] Phlebitis   [] Swelling in legs   [] Varicose veins   [] Non-healing ulcers Pulmonary:   [] Uses home oxygen   [] Productive cough   [] Hemoptysis   [] Wheeze  [] COPD   [] Asthma Neurologic:  [] Dizziness   [] Seizures   [] History of stroke   [] History of TIA  [] Aphasia   [] Vissual changes   [] Weakness or numbness in arm   [] Weakness or numbness in leg Musculoskeletal:   [] Joint swelling   [] Joint pain   [] Low back pain Hematologic:  [] Easy bruising  [] Easy bleeding   [] Hypercoagulable state   [] Anemic Gastrointestinal:  [] Diarrhea   [] Vomiting  [] Gastroesophageal reflux/heartburn   [] Difficulty swallowing. Genitourinary:  [] Chronic kidney disease   [] Difficult urination  [] Frequent urination   [] Blood in urine Skin:  [] Rashes   [] Ulcers  Psychological:  [] History of anxiety   []  History of major depression.  Physical Examination  Vitals:   02/28/19 1326  BP: 139/72  Pulse: 97  Resp: 10  Weight: 152 lb (68.9 kg)  Height: 5\' 6"  (1.676 m)   Body mass  index is 24.53 kg/m. Gen: WD/WN, NAD Head: [place]/AT, No temporalis wasting.  Ear/Nose/Throat: Hearing grossly intact, nares w/o erythema or drainage, poor dentition Eyes: PER, EOMI, sclera nonicteric.  Neck: Supple, no masses.  No bruit or JVD.  Pulmonary:  Good air movement, clear to auscultation bilaterally, no use of accessory muscles.  Cardiac: RRR, normal S1, S2, no Murmurs. Vascular:  Vessel Right Left  Radial Palpable Palpable  Brachial Palpable Palpable  Carotid Palpable Palpable  PT Not Palpable Not Palpable  DP Not Palpable Not Palpable   Gastrointestinal: soft, non-distended. No guarding/no peritoneal signs.  Musculoskeletal: M/S 5/5 throughout.  No deformity or atrophy.  Neurologic: CN 2-12 intact. Pain and light touch intact in extremities.  Symmetrical.  Speech is fluent. Motor exam as listed above.  Psychiatric: Judgment intact, Mood & affect appropriate for pt's clinical situation. Dermatologic: No rashes or ulcers noted.  No changes consistent with cellulitis. Lymph : No Cervical lymphadenopathy, no lichenification or skin changes of chronic lymphedema.  CBC Lab Results  Component Value Date   WBC 9.6 01/18/2019   HGB 9.9 (L) 01/18/2019   HCT 29.5 (L) 01/18/2019   MCV 92.5 01/18/2019   PLT 146 (L) 01/18/2019    BMET    Component Value Date/Time   NA 136 01/18/2019 0327   K 3.7 01/18/2019 0327   CL 101 01/18/2019 0327   CO2 23 01/18/2019 0327   GLUCOSE 94 01/18/2019 0327   BUN 15 01/18/2019 0327   CREATININE 1.20 01/18/2019 0327   CALCIUM 8.0 (L) 01/18/2019 0327   GFRNONAA 58 (L) 01/18/2019 0327   GFRAA >60 01/18/2019 0327   CrCl cannot be calculated (Patient's most recent lab result is older than the maximum 21 days allowed.).  COAG Lab Results  Component Value Date   INR 1.73 01/14/2019   INR 1.11 01/14/2019    Radiology Dg Chest 2 View  Result Date: 02/14/2019 CLINICAL DATA:  Status post CABG.  No chest complaints. EXAM: CHEST - 2 VIEW  COMPARISON:  Chest x-ray dated January 18, 2019. FINDINGS: The heart size and mediastinal contours are within normal limits. Prior CABG. Normal pulmonary vascularity. Decreasing now trace bilateral pleural effusions. Improved aeration of the right lower lobe. No consolidation or pneumothorax. No acute osseous abnormality. IMPRESSION: 1. Decreasing now trace bilateral pleural effusions with improved aeration of the right lower lobe. 2. Resolved left apical pneumothorax. Electronically Signed   By: Titus Dubin M.D.   On: 02/14/2019 13:25     Assessment/Plan 1. Bilateral carotid artery stenosis Recommend:  Given the patient's asymptomatic subcritical stenosis no further invasive testing or surgery at this time.  Duplex ultrasound shows Occlusion of the RICA and 64-68% LICA.  Continue antiplatelet therapy as prescribed Continue management of CAD, HTN and Hyperlipidemia Healthy heart diet,  encouraged exercise at least 4 times per week Follow up in 6 months with duplex ultrasound and physical exam  - VAS US CAROTID; Future  2. Atherosclerosis of native artery of both lower extremities with intermittent claudication (HCC)  Recommend:  The patient has evidence of atherosclerosis of the lower extremities with claudication.  The patient does not voice lifestyle limiting changes at this point in time.  Noninvasive studies do not suggest clinically significant change.  No invasive studies, angiography or surgery at this time The patient should continue walking and begin a more formal exercise program.  The patient should continue antiplatelet therapy and aggressive treatment of the lipid abnormalities  No changes in the patient's medications at this time  The patient should continue wearing graduated compression socks 10-15 mmHg strength to control the mild edema.   - VAS [place] ABI WITH/WO TBI; Future  3. ASCVD (arteriosclerotic cardiovascular disease) Continue cardiac and antihypertensive  medications as already ordered and reviewed, no changes at this time.  Continue statin as ordered and reviewed, no changes at this time  Nitrates PRN for chest pain   4. Hyperlipidemia, mixed Continue statin as ordered and reviewed, no changes at this time    Hortencia Pilar, MD  02/28/2019 1:28 PM

## 2019-03-01 ENCOUNTER — Encounter: Payer: PPO | Admitting: *Deleted

## 2019-03-01 DIAGNOSIS — Z951 Presence of aortocoronary bypass graft: Secondary | ICD-10-CM | POA: Diagnosis not present

## 2019-03-01 NOTE — Progress Notes (Signed)
Daily Session Note  Patient Details  Name: Anthony Morrison MRN: [id number] Date of Birth: Mar 15, 1941 Referring Provider:     Cardiac Rehab from 02/21/2019 in [facility] Cardiac and Pulmonary Rehab  Referring Provider  Emily Filbert MD [Attending Cardiologist: Dr. Serafina Royals      Encounter Date: 03/01/2019  Check In: Session Check In - 03/01/19 0922      Check-In   Supervising physician immediately available to respond to emergencies  See telemetry face sheet for immediately available ER MD    Location  [facility]-Cardiac & Pulmonary Rehab    Staff Present  Heath Lark, RN, BSN, CCRP;Jeanna Dur[place] BS, Exercise Physiologist;Casie Sturgeon [place], MA, RCEP, CCRP, Exercise Physiologist;Joseph [employer], [place], ACSM CEP, Exercise Physiologist    Medication changes reported      No    Fall or balance concerns reported     No    Warm-up and Cool-down  Performed as group-led instruction    Resistance Training Performed  Yes    VAD Patient?  No    PAD/SET Patient?  No      Pain Assessment   Currently in Pain?  No/denies          Social History   Tobacco Use  Smoking Status Former Smoker  . Packs/day: 1.00  . Years: 50.00  . Pack years: 50.00  . Types: Cigarettes  . Last attempt to quit: 01/06/2015  . Years since quitting: 4.1  Smokeless Tobacco Never Used    Goals Met:  Independence with exercise equipment Exercise tolerated well No report of cardiac concerns or symptoms Strength training completed today  Goals Unmet:  Not Applicable  Comments: Pt able to follow exercise prescription today without complaint.  Will continue to monitor for progression.    Dr. Emily Filbert is Medical Director for [place] and [facility].

## 2019-03-03 ENCOUNTER — Other Ambulatory Visit: Payer: Self-pay

## 2019-03-03 DIAGNOSIS — Z951 Presence of aortocoronary bypass graft: Secondary | ICD-10-CM

## 2019-03-03 NOTE — Progress Notes (Signed)
Daily Session Note  Patient Details  Name: Anthony Morrison MRN: [id number] Date of Birth: 1941/05/22 Referring Provider:     Cardiac Rehab from 02/21/2019 in [facility]  Referring Provider  Emily Filbert MD [Attending Cardiologist: Dr. Serafina Royals      Encounter Date: 03/03/2019  Check In: Session Check In - 03/03/19 1234      Check-In   Supervising physician immediately available to respond to emergencies  See telemetry face sheet for immediately available ER MD    Location  [facility]-Cardiac & Pulmonary Rehab    Staff Present  Heath Lark, RN, BSN, CCRP;Jessica [place], MA, RCEP, CCRP, Exercise Physiologist;Andreea Arca Tessie Fass RCP,RRT,BSRT    Medication changes reported      No    Fall or balance concerns reported     No    Warm-up and Cool-down  Performed as group-led instruction    Resistance Training Performed  Yes    VAD Patient?  No    PAD/SET Patient?  No      Pain Assessment   Currently in Pain?  No/denies          Social History   Tobacco Use  Smoking Status Former Smoker  . Packs/day: 1.00  . Years: 50.00  . Pack years: 50.00  . Types: Cigarettes  . Last attempt to quit: 01/06/2015  . Years since quitting: 4.1  Smokeless Tobacco Never Used    Goals Met:  Independence with exercise equipment Exercise tolerated well No report of cardiac concerns or symptoms Strength training completed today  Goals Unmet:  Not Applicable  Comments: Pt able to follow exercise prescription today without complaint.  Will continue to monitor for progression.    Dr. Emily Filbert is Medical Director for [place] and [facility].

## 2019-03-16 ENCOUNTER — Encounter: Payer: Self-pay | Admitting: *Deleted

## 2019-03-16 DIAGNOSIS — Z951 Presence of aortocoronary bypass graft: Secondary | ICD-10-CM

## 2019-03-16 NOTE — Progress Notes (Signed)
Cardiac Individual Treatment Plan  Patient Details  Name: Anthony Morrison MRN: [id number] Date of Birth: 11/23/1941 Referring Provider:     Cardiac Rehab from 02/21/2019 in [facility] Cardiac and Pulmonary Rehab  Referring Provider  Emily Filbert MD [Attending Cardiologist: Dr. Serafina Royals      Initial Encounter Date:    Cardiac Rehab from 02/21/2019 in [facility] Cardiac and Pulmonary Rehab  Date  02/21/19      Visit Diagnosis: S/P CABG x 3  Patient's Home Medications on Admission:  Current Outpatient Medications:  .  aspirin EC 325 MG EC tablet, Take 1 tablet (325 mg total) by mouth daily. (Patient taking differently: Take 81 mg by mouth daily. ), Disp: , Rfl:  .  atorvastatin (LIPITOR) 20 MG tablet, Take 1 tablet (20 mg total) by mouth daily at 6 PM., Disp: 30 tablet, Rfl: 1 .  ferrous sulfate 325 (65 FE) MG tablet, Take 325 mg by mouth daily with breakfast., Disp: , Rfl:  .  Fluticasone-Salmeterol (ADVAIR DISKUS) 250-50 MCG/DOSE AEPB, Inhale 1 puff into the lungs 2 times daily at 12 noon and 4 pm., Disp: 1 each, Rfl: 1 .  furosemide (LASIX) 20 MG tablet, Take 1 tablet (20 mg total) by mouth daily for 7 days., Disp: 7 tablet, Rfl: 0 .  lisinopril (PRINIVIL,ZESTRIL) 10 MG tablet, Take 1 tablet (10 mg total) by mouth daily., Disp: 30 tablet, Rfl: 1 .  metoprolol tartrate (LOPRESSOR) 25 MG tablet, Take 1 tablet (25 mg total) by mouth 2 (two) times daily. (Patient taking differently: Take 50 mg by mouth 2 (two) times daily. ), Disp: 60 tablet, Rfl: 1 .  oxymetazoline (AFRIN) 0.05 % nasal spray, Place 1 spray into both nostrils at bedtime., Disp: , Rfl:  .  potassium chloride (KLOR-CON 10) 10 MEQ tablet, Take 1 tablet (10 mEq total) by mouth daily for 7 days., Disp: 7 tablet, Rfl: 0  Past Medical History: Past Medical History:  Diagnosis Date  . Arthritis   . Bronchitis   . Cancer ([place])    SKIN CANCERS  . COPD (chronic obstructive pulmonary disease) (HCC)    MILD PER PT  . Pneumonia 2016     Tobacco Use: Social History   Tobacco Use  Smoking Status Former Smoker  . Packs/day: 1.00  . Years: 50.00  . Pack years: 50.00  . Types: Cigarettes  . Last attempt to quit: 01/06/2015  . Years since quitting: 4.1  Smokeless Tobacco Never Used    Labs: Recent Chemical engineer    Labs for ITP Cardiac and Pulmonary Rehab Latest Ref Rng & Units 01/14/2019 01/14/2019 01/14/2019 01/15/2019 01/15/2019   PHART 7.350 - 7.450 7.393 7.289(L) 7.313(L) 7.295(L) 7.301(L)   PCO2ART 32.0 - 48.0 mmHg 40.4 49.1(H) 45.2 45.9 48.1(H)   HCO3 20.0 - 28.0 mmol/L 24.6 23.6 23.2 22.4 23.6   TCO2 22 - 32 mmol/L 26 25 25 24 25    ACIDBASEDEF 0.0 - 2.0 mmol/L - 3.0(H) 3.0(H) 4.0(H) 3.0(H)   O2SAT % 100.0 100.0 98.0 98.0 99.0       Exercise Target Goals: Exercise Program Goal: Individual exercise prescription set using results from initial 6 min walk test and THRR while considering  patient's activity barriers and safety.   Exercise Prescription Goal: Initial exercise prescription builds to 30-45 minutes a day of aerobic activity, 2-3 days per week.  Home exercise guidelines will be given to patient during program as part of exercise prescription that the participant will acknowledge.  Activity Barriers & Risk  Stratification: Activity Barriers & Cardiac Risk Stratification - 02/21/19 1232      Activity Barriers & Cardiac Risk Stratification   Activity Barriers  Incisional Pain;Shortness of Breath;Deconditioning;Muscular Weakness    Cardiac Risk Stratification  High       6 Minute Walk: 6 Minute Walk    Row Name 02/21/19 1511         6 Minute Walk   Phase  Initial     Distance  1015 feet     Walk Time  6 minutes     # of Rest Breaks  0     MPH  2.31     METS  1.92     RPE  13     Perceived Dyspnea   1     VO2 Peak  8.08     Symptoms  Yes (comment)     Comments  SOB     Resting HR  64 bpm     Resting BP  124/66     Resting Oxygen Saturation   99 %     Exercise Oxygen Saturation   during 6 min walk  99 %     Max Ex. HR  81 bpm     Max Ex. BP  150/64     2 Minute Post BP  134/56        Oxygen Initial Assessment:   Oxygen Re-Evaluation:   Oxygen Discharge (Final Oxygen Re-Evaluation):   Initial Exercise Prescription: Initial Exercise Prescription - 02/21/19 1500      Date of Initial Exercise RX and Referring Provider   Date  02/21/19    Referring Provider  Emily Filbert MD   Attending Cardiologist: Dr. Serafina Royals     Treadmill   MPH  1.5    Grade  0.5    Minutes  15    METs  2.25      Recumbant Bike   Level  2    RPM  50    Watts  6    Minutes  15    METs  2      NuStep   Level  1    SPM  80    Minutes  15    METs  2      Prescription Details   Frequency (times per week)  2    Duration  Progress to 30 minutes of continuous aerobic without signs/symptoms of physical distress      Intensity   THRR 40-80% of Max Heartrate  96-127    Ratings of Perceived Exertion  11-13    Perceived Dyspnea  0-4      Progression   Progression  Continue to progress workloads to maintain intensity without signs/symptoms of physical distress.      Resistance Training   Training Prescription  Yes    Weight  3 lbs    Reps  10-15       Perform Capillary Blood Glucose checks as needed.  Exercise Prescription Changes: Exercise Prescription Changes    Row Name 02/21/19 1200 03/09/19 1100           Response to Exercise   Blood Pressure (Admit)  124/66  96/44      Blood Pressure (Exercise)  150/64  136/58      Blood Pressure (Exit)  134/56  128/62      Heart Rate (Admit)  64 bpm  61 bpm      Heart Rate (Exercise)  92 bpm  89 bpm  Heart Rate (Exit)  61 bpm  52 bpm      Oxygen Saturation (Admit)  99 %  -      Oxygen Saturation (Exercise)  99 %  -      Rating of Perceived Exertion (Exercise)  11  11      Perceived Dyspnea (Exercise)  1  -      Symptoms  SOB  none      Comments  walk test results  -      Duration  -  Continue with 30 min of  aerobic exercise without signs/symptoms of physical distress.      Intensity  -  THRR unchanged        Progression   Progression  -  Continue to progress workloads to maintain intensity without signs/symptoms of physical distress.      Average METs  -  2.48        Resistance Training   Training Prescription  -  Yes      Weight  -  3 lbs      Reps  -  10-15        Interval Training   Interval Training  -  No        Treadmill   MPH  -  1.5      Grade  -  0.5      Minutes  -  15      METs  -  2.25        NuStep   Level  -  3      Minutes  -  15      METs  -  2.7        Home Exercise Plan   Plans to continue exercise at  -  Home (comment) walking and videos      Frequency  -  Add 2 additional days to program exercise sessions.      Initial Home Exercises Provided  -  03/09/19 mailed info         Exercise Comments: Exercise Comments    Row Name 02/24/19 [phone]           Exercise Comments  First full day of exercise!  Patient was oriented to gym and equipment including functions, settings, policies, and procedures.  Patient's individual exercise prescription and treatment plan were reviewed.  All starting workloads were established based on the results of the 6 minute walk test done at initial orientation visit.  The plan for exercise progression was also introduced and progression will be customized based on patient's performance and goals.          Exercise Goals and Review: Exercise Goals    Row Name 02/21/19 1514             Exercise Goals   Increase Physical Activity  Yes       Intervention  Provide advice, education, support and counseling about physical activity/exercise needs.;Develop an individualized exercise prescription for aerobic and resistive training based on initial evaluation findings, risk stratification, comorbidities and participant's personal goals.       Expected Outcomes  Short Term: Attend rehab on a regular basis to increase amount of physical  activity.;Long Term: Add in home exercise to make exercise part of routine and to increase amount of physical activity.;Long Term: Exercising regularly at least 3-5 days a week.       Increase Strength and Stamina  Yes       Intervention  Provide advice, education, support and counseling about physical activity/exercise needs.;Develop an individualized exercise prescription for aerobic and resistive training based on initial evaluation findings, risk stratification, comorbidities and participant's personal goals.       Expected Outcomes  Short Term: Increase workloads from initial exercise prescription for resistance, speed, and METs.;Short Term: Perform resistance training exercises routinely during rehab and add in resistance training at home;Long Term: Improve cardiorespiratory fitness, muscular endurance and strength as measured by increased METs and functional capacity (6MWT)       Able to understand and use rate of perceived exertion (RPE) scale  Yes       Intervention  Provide education and explanation on how to use RPE scale       Expected Outcomes  Short Term: Able to use RPE daily in rehab to express subjective intensity level;Long Term:  Able to use RPE to guide intensity level when exercising independently       Knowledge and understanding of Target Heart Rate Range (THRR)  Yes       Intervention  Provide education and explanation of THRR including how the numbers were predicted and where they are located for reference       Expected Outcomes  Short Term: Able to state/look up THRR;Short Term: Able to use daily as guideline for intensity in rehab;Long Term: Able to use THRR to govern intensity when exercising independently       Able to check pulse independently  Yes       Intervention  Provide education and demonstration on how to check pulse in carotid and radial arteries.;Review the importance of being able to check your own pulse for safety during independent exercise       Expected  Outcomes  Short Term: Able to explain why pulse checking is important during independent exercise;Long Term: Able to check pulse independently and accurately       Understanding of Exercise Prescription  Yes       Intervention  Provide education, explanation, and written materials on patient's individual exercise prescription       Expected Outcomes  Short Term: Able to explain program exercise prescription;Long Term: Able to explain home exercise prescription to exercise independently          Exercise Goals Re-Evaluation : Exercise Goals Re-Evaluation    Row Name 02/24/19 0919 03/09/19 1123           Exercise Goal Re-Evaluation   Exercise Goals Review  Able to understand and use rate of perceived exertion (RPE) scale;Knowledge and understanding of Target Heart Rate Range (THRR);Understanding of Exercise Prescription  Increase Physical Activity;Increase Strength and Stamina;Understanding of Exercise Prescription      Comments  Reviewed RPE scale, THR and program prescription with pt today.  Pt voiced understanding and was given a copy of goals to take home.   Denys is off to a good start in rehab.  He has completed three full days in rehab.  We will send out his home exercise in the mail.  He will be walking and following our videos at home.   We will continue to monitor his progress at home.       Expected Outcomes  Short: Use RPE daily to regulate intensity. Long: Follow program prescription in THR.  Short: Walk at home.  Long: Continue to move more.          Discharge Exercise Prescription (Final Exercise Prescription Changes): Exercise Prescription Changes - 03/09/19 1100      Response  to Exercise   Blood Pressure (Admit)  96/44    Blood Pressure (Exercise)  136/58    Blood Pressure (Exit)  128/62    Heart Rate (Admit)  61 bpm    Heart Rate (Exercise)  89 bpm    Heart Rate (Exit)  52 bpm    Rating of Perceived Exertion (Exercise)  11    Symptoms  none    Duration  Continue with  30 min of aerobic exercise without signs/symptoms of physical distress.    Intensity  THRR unchanged      Progression   Progression  Continue to progress workloads to maintain intensity without signs/symptoms of physical distress.    Average METs  2.48      Resistance Training   Training Prescription  Yes    Weight  3 lbs    Reps  10-15      Interval Training   Interval Training  No      Treadmill   MPH  1.5    Grade  0.5    Minutes  15    METs  2.25      NuStep   Level  3    Minutes  15    METs  2.7      Home Exercise Plan   Plans to continue exercise at  Home (comment)   walking and videos   Frequency  Add 2 additional days to program exercise sessions.    Initial Home Exercises Provided  03/09/19   mailed info      Nutrition:  Target Goals: Understanding of nutrition guidelines, daily intake of sodium <1576m, cholesterol <2020m calories 30% from fat and 7% or less from saturated fats, daily to have 5 or more servings of fruits and vegetables.  Biometrics: Pre Biometrics - 02/21/19 1515      Pre Biometrics   Height  5' 7.25" (1.708 m)    Weight  151 lb 4.8 oz (68.6 kg)    Waist Circumference  35 inches    Hip Circumference  36.5 inches    Waist to Hip Ratio  0.96 %    BMI (Calculated)  23.53    Single Leg Stand  30 seconds        Nutrition Therapy Plan and Nutrition Goals: Nutrition Therapy & Goals - 02/21/19 1240      Intervention Plan   Intervention  Prescribe, educate and counsel regarding individualized specific dietary modifications aiming towards targeted core components such as weight, hypertension, lipid management, diabetes, heart failure and other comorbidities.    Expected Outcomes  Short Term Goal: Understand basic principles of dietary content, such as calories, fat, sodium, cholesterol and nutrients.;Short Term Goal: A plan has been developed with personal nutrition goals set during dietitian appointment.;Long Term Goal: Adherence to  prescribed nutrition plan.       Nutrition Assessments: Nutrition Assessments - 02/21/19 1241      MEDFICTS Scores   Pre Score  30       Nutrition Goals Re-Evaluation:   Nutrition Goals Discharge (Final Nutrition Goals Re-Evaluation):   Psychosocial: Target Goals: Acknowledge presence or absence of significant depression and/or stress, maximize coping skills, provide positive support system. Participant is able to verbalize types and ability to use techniques and skills needed for reducing stress and depression.   Initial Review & Psychosocial Screening: Initial Psych Review & Screening - 02/21/19 1236      Initial Review   Current issues with  Current Stress Concerns    Source  of Stress Concerns  Chronic Illness;Unable to participate in former interests or hobbies;Unable to perform yard/household activities   Post op still not able to do everything. Hopes to see improvement as he heals and is allowed to lift more and as he gets stronger     [place]?  Yes   Wife,children and  friends     Barriers   Psychosocial barriers to participate in program  There are no identifiable barriers or psychosocial needs.;The patient should benefit from training in stress management and relaxation.      Screening Interventions   Interventions  Encouraged to exercise;To provide support and resources with identified psychosocial needs;Provide feedback about the scores to participant    Expected Outcomes  Short Term goal: Utilizing psychosocial counselor, staff and physician to assist with identification of specific Stressors or current issues interfering with healing process. Setting desired goal for each stressor or current issue identified.;Long Term Goal: Stressors or current issues are controlled or eliminated.;Long Term goal: The participant improves quality of Life and PHQ9 Scores as seen by post scores and/or verbalization of changes;Short Term goal:  Identification and review with participant of any Quality of Life or Depression concerns found by scoring the questionnaire.       Quality of Life Scores:  Quality of Life - 02/21/19 1239      Quality of Life   Select  Quality of Life      Quality of Life Scores   Health/Function Pre  18.47 %    Socioeconomic Pre  23.17 %    Psych/Spiritual Pre  18.93 %    Family Pre  26.4 %    GLOBAL Pre  20.62 %      Scores of 19 and below usually indicate a poorer quality of life in these areas.  A difference of  2-3 points is a clinically meaningful difference.  A difference of 2-3 points in the total score of the Quality of Life Index has been associated with significant improvement in overall quality of life, self-image, physical symptoms, and general health in studies assessing change in quality of life.  PHQ-9: Recent Review Flowsheet Data    Depression screen [facility] 2/9 02/21/2019   Decreased Interest 0   Down, Depressed, Hopeless 0   PHQ - 2 Score 0   Altered sleeping 0   Tired, decreased energy 1   Change in appetite 0   Feeling bad or failure about yourself  0   Trouble concentrating 0   Moving slowly or fidgety/restless 0   Suicidal thoughts 0   PHQ-9 Score 1   Difficult doing work/chores Somewhat difficult     Interpretation of Total Score  Total Score Depression Severity:  1-4 = Minimal depression, 5-9 = Mild depression, 10-14 = Moderate depression, 15-19 = Moderately severe depression, 20-27 = Severe depression   Psychosocial Evaluation and Intervention: Psychosocial Evaluation - 03/03/19 1046      Psychosocial Evaluation & Interventions   Interventions  Encouraged to exercise with the program and follow exercise prescription    Comments  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  He reflected on unexpected nature of discovery, having needed clearance for hernia surgery and the events which led to byplass.  Hernia surgery was tabled.  Reported a history of COPD, smoker  for 50 years, quit 4 years ago.  Spouse and step-son primary support.  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  He reports sleeping 6-7 hours a night, no concern regarding appetite.  He mentioned having concern for son and son's work situation, but did not elaborate.    Expected Outcomes  Short: exercise and increase stamina; long: get back to doing what he was doing two years ago (neighborhood involvement)    Continue Psychosocial Services   Follow up required by staff       Psychosocial Re-Evaluation:   Psychosocial Discharge (Final Psychosocial Re-Evaluation):   Vocational Rehabilitation: Provide vocational rehab assistance to qualifying candidates.   Vocational Rehab Evaluation & Intervention: Vocational Rehab - 02/21/19 1244      Initial Vocational Rehab Evaluation & Intervention   Assessment shows need for Vocational Rehabilitation  No       Education: Education Goals: Education classes will be provided on a variety of topics geared toward better understanding of heart health and risk factor modification. Participant will state understanding/return demonstration of topics presented as noted by education test scores.  Learning Barriers/Preferences: Learning Barriers/Preferences - 02/21/19 1242      Learning Barriers/Preferences   Learning Barriers  Exercise Concerns   Had bilateral carotid blockages. PMD has stated OK to do Cardiac Rehab   Learning Preferences  None       Education Topics:  AED/CPR: - Group verbal and written instruction with the use of models to demonstrate the basic use of the AED with the basic ABC's of resuscitation.   General Nutrition Guidelines/Fats and Fiber: -Group instruction provided by verbal, written material, models and posters to present the general guidelines for heart healthy nutrition. Gives an explanation and review of dietary fats and fiber.    Cardiac Rehab from 03/03/2019 in [facility] Cardiac and Pulmonary Rehab  Date  03/01/19  Educator  [facility]  Instruction Review Code  1- Verbalizes Understanding      Controlling Sodium/Reading Food Labels: -Group verbal and written material supporting the discussion of sodium use in heart healthy nutrition. Review and explanation with models, verbal and written materials for utilization of the food label.   Cardiac Rehab from 03/03/2019 in [facility] Cardiac and Pulmonary Rehab  Date  03/03/19  Educator  [facility]  Instruction Review Code  1- Verbalizes Understanding      Exercise Physiology & General Exercise Guidelines: - Group verbal and written instruction with models to review the exercise physiology of the cardiovascular system and associated critical values. Provides general exercise guidelines with specific guidelines to those with heart or lung disease.    Aerobic Exercise & Resistance Training: - Gives group verbal and written instruction on the various components of exercise. Focuses on aerobic and resistive training programs and the benefits of this training and how to safely progress through these programs..   Flexibility, Balance, Mind/Body Relaxation: Provides group verbal/written instruction on the benefits of flexibility and balance training, including mind/body exercise modes such as yoga, pilates and tai chi.  Demonstration and skill practice provided.   Stress and Anxiety: - Provides group verbal and written instruction about the health risks of elevated stress and causes of high stress.  Discuss the correlation between heart/lung disease and anxiety and treatment options. Review healthy ways to manage with stress and anxiety.   Depression: - Provides group verbal and written instruction on the correlation between heart/lung disease and depressed mood, treatment options, and the stigmas associated with seeking treatment.   Anatomy & Physiology of the Heart: - Group verbal and written  instruction and models provide basic cardiac anatomy  and physiology, with the coronary electrical and arterial systems. Review of Valvular disease and Heart Failure   Cardiac Procedures: - Group verbal and written instruction to review commonly prescribed medications for heart disease. Reviews the medication, class of the drug, and side effects. Includes the steps to properly store meds and maintain the prescription regimen. (beta blockers and nitrates)   Cardiac Medications I: - Group verbal and written instruction to review commonly prescribed medications for heart disease. Reviews the medication, class of the drug, and side effects. Includes the steps to properly store meds and maintain the prescription regimen.   Cardiac Medications II: -Group verbal and written instruction to review commonly prescribed medications for heart disease. Reviews the medication, class of the drug, and side effects. (all other drug classes)   Cardiac Rehab from 03/03/2019 in [facility] Cardiac and Pulmonary Rehab  Date  02/24/19  Educator  [facility]  Instruction Review Code  1- Verbalizes Understanding       Go Sex-Intimacy & Heart Disease, Get SMART - Goal Setting: - Group verbal and written instruction through game format to discuss heart disease and the return to sexual intimacy. Provides group verbal and written material to discuss and apply goal setting through the application of the S.M.A.R.T. Method.   Other Matters of the Heart: - Provides group verbal, written materials and models to describe Stable Angina and Peripheral Artery. Includes description of the disease process and treatment options available to the cardiac patient.   Exercise & Equipment Safety: - Individual verbal instruction and demonstration of equipment use and safety with use of the equipment.   Cardiac Rehab from 03/03/2019 in [facility] Cardiac and Pulmonary Rehab  Date  02/21/19  Educator  [facility]  Instruction Review Code  1- Verbalizes  Understanding      Infection Prevention: - Provides verbal and written material to individual with discussion of infection control including proper hand washing and proper equipment cleaning during exercise session.   Cardiac Rehab from 03/03/2019 in [facility] Cardiac and Pulmonary Rehab  Date  02/21/19  Educator  [facility]  Instruction Review Code  1- Verbalizes Understanding      Falls Prevention: - Provides verbal and written material to individual with discussion of falls prevention and safety.   Cardiac Rehab from 03/03/2019 in [facility] Cardiac and Pulmonary Rehab  Date  02/21/19  Educator  [facility]  Instruction Review Code  1- Verbalizes Understanding      Diabetes: - Individual verbal and written instruction to review signs/symptoms of diabetes, desired ranges of glucose level fasting, after meals and with exercise. Acknowledge that pre and post exercise glucose checks will be done for 3 sessions at entry of program.   Know Your Numbers and Risk Factors: -Group verbal and written instruction about important numbers in your health.  Discussion of what are risk factors and how they play a role in the disease process.  Review of Cholesterol, Blood Pressure, Diabetes, and BMI and the role they play in your overall health.   Cardiac Rehab from 03/03/2019 in [facility]  Date  02/24/19  Educator  CE  Instruction Review Code  1- Verbalizes Understanding      Sleep Hygiene: -Provides group verbal and written instruction about how sleep can affect your health.  Define sleep hygiene, discuss sleep cycles and impact of sleep habits. Review good sleep hygiene tips.    Other: -Provides group and verbal instruction on various topics (see comments)   Knowledge Questionnaire Score: Knowledge Questionnaire Score - 02/21/19  1243      Knowledge Questionnaire Score   Pre Score  20/26   Reviewed correct responses with Simona Huh. He verbalized understanding of the responses and had no  further questions today.       Core Components/Risk Factors/Patient Goals at Admission: Personal Goals and Risk Factors at Admission - 02/21/19 1244      Core Components/Risk Factors/Patient Goals on Admission    Weight Management  Yes;Weight Maintenance    Intervention  Weight Management: Develop a combined nutrition and exercise program designed to reach desired caloric intake, while maintaining appropriate intake of nutrient and fiber, sodium and fats, and appropriate energy expenditure required for the weight goal.;Obesity: Provide education and appropriate resources to help participant work on and attain dietary goals.    Admit Weight  151 lb 4.8 oz (68.6 kg)    Goal Weight: Short Term  151 lb 4.8 oz (68.6 kg)    Goal Weight: Long Term  151 lb 4.8 oz (68.6 kg)    Expected Outcomes  Short Term: Continue to assess and modify interventions until short term weight is achieved;Long Term: Adherence to nutrition and physical activity/exercise program aimed toward attainment of established weight goal    Hypertension  Yes    Intervention  Provide education on lifestyle modifcations including regular physical activity/exercise, weight management, moderate sodium restriction and increased consumption of fresh fruit, vegetables, and low fat dairy, alcohol moderation, and smoking cessation.;Monitor prescription use compliance.    Expected Outcomes  Short Term: Continued assessment and intervention until BP is < 140/15m HG in hypertensive participants. < 130/817mHG in hypertensive participants with diabetes, heart failure or chronic kidney disease.;Long Term: Maintenance of blood pressure at goal levels.    Lipids  Yes    Intervention  Provide education and support for participant on nutrition & aerobic/resistive exercise along with prescribed medications to achieve LDL <7072mHDL >64m52m  Expected Outcomes  Short Term: Participant states understanding of desired cholesterol values and is compliant  with medications prescribed. Participant is following exercise prescription and nutrition guidelines.;Long Term: Cholesterol controlled with medications as prescribed, with individualized exercise RX and with personalized nutrition plan. Value goals: LDL < 70mg9mL > 40 mg.       Core Components/Risk Factors/Patient Goals Review:    Core Components/Risk Factors/Patient Goals at Discharge (Final Review):    ITP Comments: ITP Comments    Row Name 02/21/19 1229 03/09/19 1123 03/16/19 1217       ITP Comments  Medical review completed. ITP sent to Dr Mark Emily [place], changes as needed and signature. Documentation of diagnosis can be found in [facility]/2020  Our program is currently closed due to COVID-19.  We are communicating with patient via phone calls and emails.    30 day review. Continue with ITP unless directed changes by Medical Director chart review.        Comments:

## 2019-03-21 DIAGNOSIS — Z125 Encounter for screening for malignant neoplasm of prostate: Secondary | ICD-10-CM | POA: Diagnosis not present

## 2019-03-21 DIAGNOSIS — Z79899 Other long term (current) drug therapy: Secondary | ICD-10-CM | POA: Diagnosis not present

## 2019-03-21 DIAGNOSIS — E538 Deficiency of other specified B group vitamins: Secondary | ICD-10-CM | POA: Diagnosis not present

## 2019-03-22 DIAGNOSIS — Z951 Presence of aortocoronary bypass graft: Secondary | ICD-10-CM

## 2019-03-28 DIAGNOSIS — E782 Mixed hyperlipidemia: Secondary | ICD-10-CM | POA: Diagnosis not present

## 2019-03-28 DIAGNOSIS — N959 Unspecified menopausal and perimenopausal disorder: Secondary | ICD-10-CM | POA: Diagnosis not present

## 2019-03-28 DIAGNOSIS — E538 Deficiency of other specified B group vitamins: Secondary | ICD-10-CM | POA: Diagnosis not present

## 2019-03-28 DIAGNOSIS — I700 Atherosclerosis of aorta: Secondary | ICD-10-CM | POA: Diagnosis not present

## 2019-03-28 DIAGNOSIS — D649 Anemia, unspecified: Secondary | ICD-10-CM | POA: Diagnosis not present

## 2019-03-28 DIAGNOSIS — K449 Diaphragmatic hernia without obstruction or gangrene: Secondary | ICD-10-CM | POA: Diagnosis not present

## 2019-03-28 DIAGNOSIS — Z1231 Encounter for screening mammogram for malignant neoplasm of breast: Secondary | ICD-10-CM | POA: Diagnosis not present

## 2019-03-28 DIAGNOSIS — I10 Essential (primary) hypertension: Secondary | ICD-10-CM | POA: Diagnosis not present

## 2019-03-28 DIAGNOSIS — N183 Chronic kidney disease, stage 3 (moderate): Secondary | ICD-10-CM | POA: Diagnosis not present

## 2019-03-28 DIAGNOSIS — C3411 Malignant neoplasm of upper lobe, right bronchus or lung: Secondary | ICD-10-CM | POA: Diagnosis not present

## 2019-03-28 DIAGNOSIS — R17 Unspecified jaundice: Secondary | ICD-10-CM | POA: Diagnosis not present

## 2019-03-28 DIAGNOSIS — J431 Panlobular emphysema: Secondary | ICD-10-CM | POA: Diagnosis not present

## 2019-03-28 DIAGNOSIS — Z20828 Contact with and (suspected) exposure to other viral communicable diseases: Secondary | ICD-10-CM | POA: Diagnosis not present

## 2019-03-28 DIAGNOSIS — M2578 Osteophyte, vertebrae: Secondary | ICD-10-CM | POA: Diagnosis not present

## 2019-03-28 DIAGNOSIS — R739 Hyperglycemia, unspecified: Secondary | ICD-10-CM | POA: Diagnosis not present

## 2019-03-28 DIAGNOSIS — M1990 Unspecified osteoarthritis, unspecified site: Secondary | ICD-10-CM | POA: Diagnosis not present

## 2019-03-28 DIAGNOSIS — K222 Esophageal obstruction: Secondary | ICD-10-CM | POA: Diagnosis not present

## 2019-03-28 DIAGNOSIS — J432 Centrilobular emphysema: Secondary | ICD-10-CM | POA: Diagnosis not present

## 2019-03-28 DIAGNOSIS — M48061 Spinal stenosis, lumbar region without neurogenic claudication: Secondary | ICD-10-CM | POA: Diagnosis not present

## 2019-03-28 DIAGNOSIS — K219 Gastro-esophageal reflux disease without esophagitis: Secondary | ICD-10-CM | POA: Diagnosis not present

## 2019-03-28 DIAGNOSIS — M5116 Intervertebral disc disorders with radiculopathy, lumbar region: Secondary | ICD-10-CM | POA: Diagnosis not present

## 2019-03-28 DIAGNOSIS — I169 Hypertensive crisis, unspecified: Secondary | ICD-10-CM | POA: Diagnosis not present

## 2019-03-28 DIAGNOSIS — I7101 Dissection of thoracic aorta: Secondary | ICD-10-CM | POA: Diagnosis not present

## 2019-03-28 DIAGNOSIS — E876 Hypokalemia: Secondary | ICD-10-CM | POA: Diagnosis not present

## 2019-03-28 DIAGNOSIS — Z0000 Encounter for general adult medical examination without abnormal findings: Secondary | ICD-10-CM | POA: Diagnosis not present

## 2019-03-28 DIAGNOSIS — M797 Fibromyalgia: Secondary | ICD-10-CM | POA: Diagnosis not present

## 2019-04-05 DIAGNOSIS — I739 Peripheral vascular disease, unspecified: Secondary | ICD-10-CM | POA: Diagnosis not present

## 2019-04-05 DIAGNOSIS — E782 Mixed hyperlipidemia: Secondary | ICD-10-CM | POA: Diagnosis not present

## 2019-04-05 DIAGNOSIS — I2510 Atherosclerotic heart disease of native coronary artery without angina pectoris: Secondary | ICD-10-CM | POA: Diagnosis not present

## 2019-04-05 DIAGNOSIS — I493 Ventricular premature depolarization: Secondary | ICD-10-CM | POA: Diagnosis not present

## 2019-04-05 DIAGNOSIS — I10 Essential (primary) hypertension: Secondary | ICD-10-CM | POA: Diagnosis not present

## 2019-04-13 DIAGNOSIS — J432 Centrilobular emphysema: Secondary | ICD-10-CM | POA: Diagnosis not present

## 2019-04-15 ENCOUNTER — Encounter: Payer: Self-pay | Admitting: *Deleted

## 2019-04-15 DIAGNOSIS — Z951 Presence of aortocoronary bypass graft: Secondary | ICD-10-CM

## 2019-04-18 DIAGNOSIS — J432 Centrilobular emphysema: Secondary | ICD-10-CM | POA: Diagnosis not present

## 2019-05-09 ENCOUNTER — Encounter: Payer: Self-pay | Admitting: *Deleted

## 2019-05-09 DIAGNOSIS — Z951 Presence of aortocoronary bypass graft: Secondary | ICD-10-CM

## 2019-05-18 DIAGNOSIS — J432 Centrilobular emphysema: Secondary | ICD-10-CM | POA: Diagnosis not present

## 2019-05-30 ENCOUNTER — Encounter: Payer: Self-pay | Admitting: *Deleted

## 2019-05-30 DIAGNOSIS — Z951 Presence of aortocoronary bypass graft: Secondary | ICD-10-CM

## 2019-06-18 DIAGNOSIS — J432 Centrilobular emphysema: Secondary | ICD-10-CM | POA: Diagnosis not present

## 2019-07-06 ENCOUNTER — Encounter: Payer: Self-pay | Admitting: *Deleted

## 2019-07-06 DIAGNOSIS — Z951 Presence of aortocoronary bypass graft: Secondary | ICD-10-CM

## 2019-07-06 NOTE — Progress Notes (Signed)
Cardiac Individual Treatment Plan  Patient Details  Name: Anthony Morrison MRN: [id number] Date of Birth: [birth year] Referring Provider:     Cardiac Rehab from 02/21/2019 in [facility] Cardiac and Pulmonary Rehab  Referring Provider  Emily Filbert MD [Attending Cardiologist: Dr. Serafina Royals      Initial Encounter Date:    Cardiac Rehab from 02/21/2019 in [facility] Cardiac and Pulmonary Rehab  Date  02/21/19      Visit Diagnosis: S/P CABG x 3  Patient's Home Medications on Admission:  Current Outpatient Medications:  .  aspirin EC 325 MG EC tablet, Take 1 tablet (325 mg total) by mouth daily. (Patient taking differently: Take 81 mg by mouth daily. ), Disp: , Rfl:  .  atorvastatin (LIPITOR) 20 MG tablet, Take 1 tablet (20 mg total) by mouth daily at 6 PM., Disp: 30 tablet, Rfl: 1 .  ferrous sulfate 325 (65 FE) MG tablet, Take 325 mg by mouth daily with breakfast., Disp: , Rfl:  .  Fluticasone-Salmeterol (ADVAIR DISKUS) 250-50 MCG/DOSE AEPB, Inhale 1 puff into the lungs 2 times daily at 12 noon and 4 pm., Disp: 1 each, Rfl: 1 .  furosemide (LASIX) 20 MG tablet, Take 1 tablet (20 mg total) by mouth daily for 7 days., Disp: 7 tablet, Rfl: 0 .  lisinopril (PRINIVIL,ZESTRIL) 10 MG tablet, Take 1 tablet (10 mg total) by mouth daily., Disp: 30 tablet, Rfl: 1 .  metoprolol tartrate (LOPRESSOR) 25 MG tablet, Take 1 tablet (25 mg total) by mouth 2 (two) times daily. (Patient taking differently: Take 50 mg by mouth 2 (two) times daily. ), Disp: 60 tablet, Rfl: 1 .  oxymetazoline (AFRIN) 0.05 % nasal spray, Place 1 spray into both nostrils at bedtime., Disp: , Rfl:  .  potassium chloride (KLOR-CON 10) 10 MEQ tablet, Take 1 tablet (10 mEq total) by mouth daily for 7 days., Disp: 7 tablet, Rfl: 0  Past Medical History: Past Medical History:  Diagnosis Date  . Arthritis   . Bronchitis   . 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 COPD (chronic obstructive pulmonary disease) (H[facility])    MILD PER PT  . Pneumonia 2016     Tobacco Use: Social History   Tobacco Use  Smoking Status Former Smoker  . Packs/day: 1.00  . Years: 50.00  . Pack years: 50.00  . Types: Cigarettes  . Quit date: 01/06/2015  . Years since quitting: 4.4  Smokeless Tobacco Never Used    Labs: Recent Review Flowsheet Data    Labs for ITP Cardiac and Pulmonary Rehab Latest Ref Rng & Units 01/14/2019 01/14/2019 01/14/2019 01/15/2019 01/15/2019   PHART 7.350 - 7.450 7.393 7.289(L) 7.313(L) 7.295(L) 7.301(L)   PCO2ART 32.0 - 48.0 mmHg 40.4 49.1(H) 45.2 45.9 48.1(H)   HCO3 20.0 - 28.0 mmol/L 24.6 23.6 23.2 22.4 23.6   TCO2 22 - 32 mmol/L 26 25 25 24 25    ACIDBASEDEF 0.0 - 2.0 mmol/L - 3.0(H) 3.0(H) 4.0(H) 3.0(H)   O2SAT % 100.0 100.0 98.0 98.0 99.0       Exercise Target Goals: Exercise Program Goal: Individual exercise prescription set using results from initial 6 min walk test and THRR while considering  patient's activity barriers and safety.   Exercise Prescription Goal: Initial exercise prescription builds to 30-45 minutes a day of aerobic activity, 2-3 days per week.  Home exercise guidelines will be given to patient during program as part of exercise prescription that the participant will acknowledge.  Activity Barriers & Risk Stratification: Activity  Barriers & Cardiac Risk Stratification - 02/21/19 1232      Activity Barriers & Cardiac Risk Stratification   Activity Barriers  Incisional Pain;Shortness of Breath;Deconditioning;Muscular Weakness    Cardiac Risk Stratification  High       6 Minute Walk: 6 Minute Walk    Row Name 02/21/19 1511         6 Minute Walk   Phase  Initial     Distance  1015 feet     Walk Time  6 minutes     # of Rest Breaks  0     MPH  2.31     METS  1.92     RPE  13     Perceived Dyspnea   1     VO2 Peak  8.08     Symptoms  Yes (comment)     Comments  SOB     Resting HR  64 bpm     Resting BP  124/66     Resting Oxygen Saturation   99 %     Exercise Oxygen Saturation  during 6  min walk  99 %     Max Ex. HR  81 bpm     Max Ex. BP  150/64     2 Minute Post BP  134/56        Oxygen Initial Assessment:   Oxygen Re-Evaluation:   Oxygen Discharge (Final Oxygen Re-Evaluation):   Initial Exercise Prescription: Initial Exercise Prescription - 02/21/19 1500      Date of Initial Exercise RX and Referring Provider   Date  02/21/19    Referring Provider  Emily Filbert MD   Attending Cardiologist: Dr. Serafina Royals     Treadmill   MPH  1.5    Grade  0.5    Minutes  15    METs  2.25      Recumbant Bike   Level  2    RPM  50    Watts  6    Minutes  15    METs  2      NuStep   Level  1    SPM  80    Minutes  15    METs  2      Prescription Details   Frequency (times per week)  2    Duration  Progress to 30 minutes of continuous aerobic without signs/symptoms of physical distress      Intensity   THRR 40-80% of Max Heartrate  96-127    Ratings of Perceived Exertion  11-13    Perceived Dyspnea  0-4      Progression   Progression  Continue to progress workloads to maintain intensity without signs/symptoms of physical distress.      Resistance Training   Training Prescription  Yes    Weight  3 lbs    Reps  10-15       Perform Capillary Blood Glucose checks as needed.  Exercise Prescription Changes: Exercise Prescription Changes    Row Name 02/21/19 1200 03/09/19 1100           Response to Exercise   Blood Pressure (Admit)  124/66  96/44      Blood Pressure (Exercise)  150/64  136/58      Blood Pressure (Exit)  134/56  128/62      Heart Rate (Admit)  64 bpm  61 bpm      Heart Rate (Exercise)  92 bpm  89 bpm  Heart Rate (Exit)  61 bpm  52 bpm      Oxygen Saturation (Admit)  99 %  -      Oxygen Saturation (Exercise)  99 %  -      Rating of Perceived Exertion (Exercise)  11  11      Perceived Dyspnea (Exercise)  1  -      Symptoms  SOB  none      Comments  walk test results  -      Duration  -  Continue with 30 min of aerobic  exercise without signs/symptoms of physical distress.      Intensity  -  THRR unchanged        Progression   Progression  -  Continue to progress workloads to maintain intensity without signs/symptoms of physical distress.      Average METs  -  2.48        Resistance Training   Training Prescription  -  Yes      Weight  -  3 lbs      Reps  -  10-15        Interval Training   Interval Training  -  No        Treadmill   MPH  -  1.5      Grade  -  0.5      Minutes  -  15      METs  -  2.25        NuStep   Level  -  3      Minutes  -  15      METs  -  2.7        Home Exercise Plan   Plans to continue exercise at  -  Home (comment) walking and videos      Frequency  -  Add 2 additional days to program exercise sessions.      Initial Home Exercises Provided  -  03/09/19 mailed info         Exercise Comments: Exercise Comments    Row Name 02/24/19 [phone]           Exercise Comments  First full day of exercise!  Patient was oriented to gym and equipment including functions, settings, policies, and procedures.  Patient's individual exercise prescription and treatment plan were reviewed.  All starting workloads were established based on the results of the 6 minute walk test done at initial orientation visit.  The plan for exercise progression was also introduced and progression will be customized based on patient's performance and goals.          Exercise Goals and Review: Exercise Goals    Row Name 02/21/19 1514             Exercise Goals   Increase Physical Activity  Yes       Intervention  Provide advice, education, support and counseling about physical activity/exercise needs.;Develop an individualized exercise prescription for aerobic and resistive training based on initial evaluation findings, risk stratification, comorbidities and participant's personal goals.       Expected Outcomes  Short Term: Attend rehab on a regular basis to increase amount of physical  activity.;Long Term: Add in home exercise to make exercise part of routine and to increase amount of physical activity.;Long Term: Exercising regularly at least 3-5 days a week.       Increase Strength and Stamina  Yes       Intervention  Provide advice, education, support and counseling about physical activity/exercise needs.;Develop an individualized exercise prescription for aerobic and resistive training based on initial evaluation findings, risk stratification, comorbidities and participant's personal goals.       Expected Outcomes  Short Term: Increase workloads from initial exercise prescription for resistance, speed, and METs.;Short Term: Perform resistance training exercises routinely during rehab and add in resistance training at home;Long Term: Improve cardiorespiratory fitness, muscular endurance and strength as measured by increased METs and functional capacity (6MWT)       Able to understand and use rate of perceived exertion (RPE) scale  Yes       Intervention  Provide education and explanation on how to use RPE scale       Expected Outcomes  Short Term: Able to use RPE daily in rehab to express subjective intensity level;Long Term:  Able to use RPE to guide intensity level when exercising independently       Knowledge and understanding of Target Heart Rate Range (THRR)  Yes       Intervention  Provide education and explanation of THRR including how the numbers were predicted and where they are located for reference       Expected Outcomes  Short Term: Able to state/look up THRR;Short Term: Able to use daily as guideline for intensity in rehab;Long Term: Able to use THRR to govern intensity when exercising independently       Able to check pulse independently  Yes       Intervention  Provide education and demonstration on how to check pulse in carotid and radial arteries.;Review the importance of being able to check your own pulse for safety during independent exercise       Expected  Outcomes  Short Term: Able to explain why pulse checking is important during independent exercise;Long Term: Able to check pulse independently and accurately       Understanding of Exercise Prescription  Yes       Intervention  Provide education, explanation, and written materials on patient's individual exercise prescription       Expected Outcomes  Short Term: Able to explain program exercise prescription;Long Term: Able to explain home exercise prescription to exercise independently          Exercise Goals Re-Evaluation : Exercise Goals Re-Evaluation    Row Name 02/24/19 0919 03/09/19 1123 03/28/19 1027 04/15/19 1055 05/09/19 1050     Exercise Goal Re-Evaluation   Exercise Goals Review  Able to understand and use rate of perceived exertion (RPE) scale;Knowledge and understanding of Target Heart Rate Range (THRR);Understanding of Exercise Prescription  Increase Physical Activity;Increase Strength and Stamina;Understanding of Exercise Prescription  Increase Physical Activity;Increase Strength and Stamina;Understanding of Exercise Prescription  Increase Physical Activity;Increase Strength and Stamina;Understanding of Exercise Prescription  Increase Physical Activity;Increase Strength and Stamina;Understanding of Exercise Prescription   Comments  Reviewed RPE scale, THR and program prescription with pt today.  Pt voiced understanding and was given a copy of goals to take home.   Anthony Morrison is off to a good start in rehab.  He has completed three full days in rehab.  We will send out his home exercise in the mail.  He will be walking and following our videos at home.   We will continue to monitor his progress at home.   Anthony Morrison has been doing well at home.  He is walking outside 20-30 minutes each day.  He is feeling like his stamina is recovering well.  We will continue to monitor  his progression at home.   Anthony Morrison is doing well at home.  He is staying active and walking every day.  He has enjoyed our emails  and videos.  His energy levels continue to improve.   Anthony Morrison continues to do well at home.  He continues to walk daily.  He is also staying active with his work around the [employer] complex.     Expected Outcomes  Short: Use RPE daily to regulate intensity. Long: Follow program prescription in THR.  Short: Walk at home.  Long: Continue to move more.   Short: Continue to walk daily and use our videos some.  Long: Continue to increase activity at home.   Short: Continue to walk daily  Long: Continue to build stamina.   Short: Continue walking. Long; Continue to maintain activity levels.    [place] Name 05/30/19 1458             Exercise Goal Re-Evaluation   Exercise Goals Review  Increase Physical Activity;Increase Strength and Stamina;Understanding of Exercise Prescription       Comments  Anthony Morrison continues to stay busy at home. He is walking an staying busy around the complex.        Expected Outcomes  Short: Continue walking. Long; Continue to maintain activity levels.           Discharge Exercise Prescription (Final Exercise Prescription Changes): Exercise Prescription Changes - 03/09/19 1100      Response to Exercise   Blood Pressure (Admit)  96/44    Blood Pressure (Exercise)  136/58    Blood Pressure (Exit)  128/62    Heart Rate (Admit)  61 bpm    Heart Rate (Exercise)  89 bpm    Heart Rate (Exit)  52 bpm    Rating of Perceived Exertion (Exercise)  11    Symptoms  none    Duration  Continue with 30 min of aerobic exercise without signs/symptoms of physical distress.    Intensity  THRR unchanged      Progression   Progression  Continue to progress workloads to maintain intensity without signs/symptoms of physical distress.    Average METs  2.48      Resistance Training   Training Prescription  Yes    Weight  3 lbs    Reps  10-15      Interval Training   Interval Training  No      Treadmill   MPH  1.5    Grade  0.5    Minutes  15    METs  2.25      NuStep   Level  3     Minutes  15    METs  2.7      Home Exercise Plan   Plans to continue exercise at  Home (comment)   walking and videos   Frequency  Add 2 additional days to program exercise sessions.    Initial Home Exercises Provided  03/09/19   mailed info      Nutrition:  Target Goals: Understanding of nutrition guidelines, daily intake of sodium <1552m, cholesterol <2043m calories 30% from fat and 7% or less from saturated fats, daily to have 5 or more servings of fruits and vegetables.  Biometrics: Pre Biometrics - 02/21/19 1515      Pre Biometrics   Height  5' 7.25" (1.708 m)    Weight  151 lb 4.8 oz (68.6 kg)    Waist Circumference  35 inches    Hip Circumference  36.5  inches    Waist to Hip Ratio  0.96 %    BMI (Calculated)  23.53    Single Leg Stand  30 seconds        Nutrition Therapy Plan and Nutrition Goals: Nutrition Therapy & Goals - 03/22/19 1234      Nutrition Therapy   Diet  heart healthy, Low sodium 1400-1500kcal    Protein (specify units)  55-65g    Fiber  30 grams    Whole Grain Foods  3 servings    Saturated Fats  12 max. grams    Fruits and Vegetables  5 servings/day    Sodium  1.5 grams      Personal Nutrition Goals   Nutrition Goal  ST: move more at home during quarentine LT:get strength back    Comments  No salt shaker on table. 2 meals/day, sleeps in. B scrambled egg, oatmeal or grits or [employer] toast or sometimes english muffin with butter and preserves. S: trisuits with low fat cream cheese . D: chnages but last night had pineapple w/ cottage cheese and SOS(creamed beef) on [employer] toast . 2-3x/week will have chopped salad for dinner with oil and vinegar. Discussed MyPlate.       Intervention Plan   Intervention  Prescribe, educate and counsel regarding individualized specific dietary modifications aiming towards targeted core components such as weight, hypertension, lipid management, diabetes, heart failure and other comorbidities.    Expected Outcomes  Short Term  Goal: Understand basic principles of dietary content, such as calories, fat, sodium, cholesterol and nutrients.;Short Term Goal: A plan has been developed with personal nutrition goals set during dietitian appointment.;Long Term Goal: Adherence to prescribed nutrition plan.       Nutrition Assessments: Nutrition Assessments - 02/21/19 1241      MEDFICTS Scores   Pre Score  30       Nutrition Goals Re-Evaluation:   Nutrition Goals Discharge (Final Nutrition Goals Re-Evaluation):   Psychosocial: Target Goals: Acknowledge presence or absence of significant depression and/or stress, maximize coping skills, provide positive support system. Participant is able to verbalize types and ability to use techniques and skills needed for reducing stress and depression.   Initial Review & Psychosocial Screening: Initial Psych Review & Screening - 02/21/19 1236      Initial Review   Current issues with  Current Stress Concerns    Source of Stress Concerns  Chronic Illness;Unable to participate in former interests or hobbies;Unable to perform yard/household activities   Post op still not able to do everything. Hopes to see improvement as he heals and is allowed to lift more and as he gets stronger     [place]?  Yes   Wife,children and  friends     Barriers   Psychosocial barriers to participate in program  There are no identifiable barriers or psychosocial needs.;The patient should benefit from training in stress management and relaxation.      Screening Interventions   Interventions  Encouraged to exercise;To provide support and resources with identified psychosocial needs;Provide feedback about the scores to participant    Expected Outcomes  Short Term goal: Utilizing psychosocial counselor, staff and physician to assist with identification of specific Stressors or current issues interfering with healing process. Setting desired goal for each stressor or current  issue identified.;Long Term Goal: Stressors or current issues are controlled or eliminated.;Long Term goal: The participant improves quality of Life and PHQ9 Scores as seen by post scores and/or verbalization of  changes;Short Term goal: Identification and review with participant of any Quality of Life or Depression concerns found by scoring the questionnaire.       Quality of Life Scores:  Quality of Life - 02/21/19 1239      Quality of Life   Select  Quality of Life      Quality of Life Scores   Health/Function Pre  18.47 %    Socioeconomic Pre  23.17 %    Psych/Spiritual Pre  18.93 %    Family Pre  26.4 %    GLOBAL Pre  20.62 %      Scores of 19 and below usually indicate a poorer quality of life in these areas.  A difference of  2-3 points is a clinically meaningful difference.  A difference of 2-3 points in the total score of the Quality of Life Index has been associated with significant improvement in overall quality of life, self-image, physical symptoms, and general health in studies assessing change in quality of life.  PHQ-9: Recent Review Flowsheet Data    Depression screen [facility] 2/9 02/21/2019   Decreased Interest 0   Down, Depressed, Hopeless 0   PHQ - 2 Score 0   Altered sleeping 0   Tired, decreased energy 1   Change in appetite 0   Feeling bad or failure about yourself  0   Trouble concentrating 0   Moving slowly or fidgety/restless 0   Suicidal thoughts 0   PHQ-9 Score 1   Difficult doing work/chores Somewhat difficult     Interpretation of Total Score  Total Score Depression Severity:  1-4 = Minimal depression, 5-9 = Mild depression, 10-14 = Moderate depression, 15-19 = Moderately severe depression, 20-27 = Severe depression   Psychosocial Evaluation and Intervention: Psychosocial Evaluation - 03/03/19 1046      Psychosocial Evaluation & Interventions   Interventions  Encouraged to exercise with the program and follow exercise prescription    Comments   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  He reflected on unexpected nature of discovery, having needed clearance for hernia surgery and the events which led to byplass.  Hernia surgery was tabled.  Reported a history of COPD, smoker for 50 years, quit 4 years ago.  Spouse and step-son primary support.  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  He reports sleeping 6-7 hours a night, no concern regarding appetite.  He mentioned having concern for son and son's work situation, but did not elaborate.    Expected Outcomes  Short: exercise and increase stamina; long: get back to doing what he was doing two years ago (neighborhood involvement)    Continue Psychosocial Services   Follow up required by staff       Psychosocial Re-Evaluation: Psychosocial Re-Evaluation    [place] Name 03/28/19 1009 04/15/19 1104 05/09/19 1053 05/30/19 1459       Psychosocial Re-Evaluation   Current issues with  None Identified  Current Stress Concerns  Current Stress Concerns  Current Stress Concerns    Comments  Anthony Morrison is doing well at home. He has an appointment today with Dr. Sabra Heck.  He is a little nervous going in but is planning to wear a mask for his protection. Overall, he is feeling well and coping well with being home.   Anthony Morrison is doing well at home. He had a pulmonology visit this week and they started him on a daily nebulizer.  When I called he had been on the phone with insurance arguing about paying for it.  He wanted to pay all at once, versus monthly.  He was grateful for the call interruption.  Overall, he is doing well and has been enjoying our emails.  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    Anthony Morrison is still soing well.  He has d/c's his nebulizer as it was causing jitter and palpitations.   He continues to work around the complex.  His biggest worry right now is making sure roofs are covered with all the rain coming.   Anthony Morrison is feeling better.  He still has the roofing project going on, but hopes to have that wrapped up within the next week or so.  He is feeling good overall.     Expected Outcomes  Short: Have good follow up appointment today.  Long: Continue to cope well.   Short: Get new projects done.  Long: Continue to stay positive.   Short: Continue to manage projects.  Long: Continue to stay postive at home.   Short: Continue to manage projects.  Long: Continue to stay postive at home.     Interventions  Encouraged to attend Cardiac Rehabilitation for the exercise  Encouraged to attend Cardiac Rehabilitation for the exercise  Encouraged to attend Cardiac Rehabilitation for the exercise  -    Continue Psychosocial Services   Follow up required by staff  Follow up required by staff  Follow up required by staff  -       Psychosocial Discharge (Final Psychosocial Re-Evaluation): Psychosocial Re-Evaluation - 05/30/19 1459      Psychosocial Re-Evaluation   Current issues with  Current Stress Concerns    Comments  Anthony Morrison is feeling better.  He still has the roofing project going on, but hopes to have that wrapped up within the next week or so.  He is feeling good overall.     Expected Outcomes  Short: Continue to manage projects.  Long: Continue to stay postive at home.        Vocational Rehabilitation: Provide vocational rehab assistance to qualifying candidates.   Vocational Rehab Evaluation & Intervention: Vocational Rehab - 02/21/19 1244      Initial Vocational Rehab Evaluation & Intervention   Assessment shows need for Vocational Rehabilitation  No       Education: Education Goals: Education classes will be provided on a variety of topics geared toward better understanding of heart health and risk factor modification. Participant will state understanding/return  demonstration of topics presented as noted by education test scores.  Learning Barriers/Preferences: Learning Barriers/Preferences - 02/21/19 1242      Learning Barriers/Preferences   Learning Barriers  Exercise Concerns   Had bilateral carotid blockages. PMD has stated OK to do Cardiac Rehab   Learning Preferences  None       Education Topics:  AED/CPR: - Group verbal and written instruction with the use of models to demonstrate the basic use of the AED with the basic ABC's of resuscitation.   General Nutrition Guidelines/Fats and Fiber: -Group instruction provided by verbal, written material, models and posters to present the general guidelines for heart healthy nutrition. Gives an explanation and review of dietary fats and fiber.   Cardiac Rehab from 03/03/2019 in [place] [facility]  Date  03/01/19  Educator  [place]  Instruction Review Code  1- Verbalizes Understanding      Controlling Sodium/Reading Food Labels: -Group verbal and written material supporting the discussion of sodium use in heart healthy nutrition. Review and explanation with models, verbal and written materials for utilization of the food label.   Cardiac Rehab from 03/03/2019 in [facility] Cardiac and Pulmonary Rehab  Date  03/03/19  Educator  [facility]  Instruction Review Code  1- Verbalizes Understanding      Exercise Physiology & General Exercise Guidelines: - Group verbal and written instruction with models to review the exercise physiology of the cardiovascular system and associated critical values. Provides general exercise guidelines with specific guidelines to those with heart or lung disease.    Aerobic Exercise & Resistance Training: - Gives group verbal and written instruction on the various components of exercise. Focuses on aerobic and resistive training programs and the benefits of this training and how to safely progress through these programs..   Flexibility, Balance, Mind/Body  Relaxation: Provides group verbal/written instruction on the benefits of flexibility and balance training, including mind/body exercise modes such as yoga, pilates and tai chi.  Demonstration and skill practice provided.   Stress and Anxiety: - Provides group verbal and written instruction about the health risks of elevated stress and causes of high stress.  Discuss the correlation between heart/lung disease and anxiety and treatment options. Review healthy ways to manage with stress and anxiety.   Depression: - Provides group verbal and written instruction on the correlation between heart/lung disease and depressed mood, treatment options, and the stigmas associated with seeking treatment.   Anatomy & Physiology of the Heart: - Group verbal and written instruction and models provide basic cardiac anatomy and physiology, with the coronary electrical and arterial systems. Review of Valvular disease and Heart Failure   Cardiac Procedures: - Group verbal and written instruction to review commonly prescribed medications for heart disease. Reviews the medication, class of the drug, and side effects. Includes the steps to properly store meds and maintain the prescription regimen. (beta blockers and nitrates)   Cardiac Medications I: - Group verbal and written instruction to review commonly prescribed medications for heart disease. Reviews the medication, class of the drug, and side effects. Includes the steps to properly store meds and maintain the prescription regimen.   Cardiac Medications II: -Group verbal and written instruction to review commonly prescribed medications for heart disease. Reviews the medication, class of the drug, and side effects. (all other drug classes)   Cardiac Rehab from 03/03/2019 in [facility] Cardiac and Pulmonary Rehab  Date  02/24/19  Educator  CE  Instruction Review Code  1- Verbalizes Understanding       Go Sex-Intimacy & Heart Disease, Get SMART - Goal  Setting: - Group verbal and written instruction through game format to discuss heart disease and the return to sexual intimacy. Provides group verbal and written material to discuss and apply goal setting through the application of the S.M.A.R.T. Method.   Other Matters of the Heart: - Provides group verbal, written materials and models to describe Stable Angina and Peripheral Artery. Includes description of the disease process and treatment options available to the cardiac patient.   Exercise & Equipment Safety: - Individual verbal instruction and demonstration of equipment use and safety with use of the equipment.   Cardiac Rehab from 03/03/2019 in [facility] Cardiac and Pulmonary Rehab  Date  02/21/19  Educator  [facility]  Instruction Review Code  1- Verbalizes Understanding  Infection Prevention: - Provides verbal and written material to individual with discussion of infection control including proper hand washing and proper equipment cleaning during exercise session.   Cardiac Rehab from 03/03/2019 in [facility] Cardiac and Pulmonary Rehab  Date  02/21/19  Educator  [facility]  Instruction Review Code  1- Verbalizes Understanding      Falls Prevention: - Provides verbal and written material to individual with discussion of falls prevention and safety.   Cardiac Rehab from 03/03/2019 in [facility] Cardiac and Pulmonary Rehab  Date  02/21/19  Educator  [facility]  Instruction Review Code  1- Verbalizes Understanding      Diabetes: - Individual verbal and written instruction to review signs/symptoms of diabetes, desired ranges of glucose level fasting, after meals and with exercise. Acknowledge that pre and post exercise glucose checks will be done for 3 sessions at entry of program.   Know Your Numbers and Risk Factors: -Group verbal and written instruction about important numbers in your health.  Discussion of what are risk factors and how they play a role in the disease process.  Review of Cholesterol, Blood  Pressure, Diabetes, and BMI and the role they play in your overall health.   Cardiac Rehab from 03/03/2019 in [facility] Cardiac and Pulmonary Rehab  Date  02/24/19  Educator  CE  Instruction Review Code  1- Verbalizes Understanding      Sleep Hygiene: -Provides group verbal and written instruction about how sleep can affect your health.  Define sleep hygiene, discuss sleep cycles and impact of sleep habits. Review good sleep hygiene tips.    Other: -Provides group and verbal instruction on various topics (see comments)   Knowledge Questionnaire Score: Knowledge Questionnaire Score - 02/21/19 1243      Knowledge Questionnaire Score   Pre Score  20/26   Reviewed correct responses with Anthony Morrison. He verbalized understanding of the responses and had no further questions today.       Core Components/Risk Factors/Patient Goals at Admission: Personal Goals and Risk Factors at Admission - 02/21/19 1244      Core Components/Risk Factors/Patient Goals on Admission    Weight Management  Yes;Weight Maintenance    Intervention  Weight Management: Develop a combined nutrition and exercise program designed to reach desired caloric intake, while maintaining appropriate intake of nutrient and fiber, sodium and fats, and appropriate energy expenditure required for the weight goal.;Obesity: Provide education and appropriate resources to help participant work on and attain dietary goals.    Admit Weight  151 lb 4.8 oz (68.6 kg)    Goal Weight: Short Term  151 lb 4.8 oz (68.6 kg)    Goal Weight: Long Term  151 lb 4.8 oz (68.6 kg)    Expected Outcomes  Short Term: Continue to assess and modify interventions until short term weight is achieved;Long Term: Adherence to nutrition and physical activity/exercise program aimed toward attainment of established weight goal    Hypertension  Yes    Intervention  Provide education on lifestyle modifcations including regular physical activity/exercise, weight management,  moderate sodium restriction and increased consumption of fresh fruit, vegetables, and low fat dairy, alcohol moderation, and smoking cessation.;Monitor prescription use compliance.    Expected Outcomes  Short Term: Continued assessment and intervention until BP is < 140/31m HG in hypertensive participants. < 130/813mHG in hypertensive participants with diabetes, heart failure or chronic kidney disease.;Long Term: Maintenance of blood pressure at goal levels.    Lipids  Yes    Intervention  Provide education and  support for participant on nutrition & aerobic/resistive exercise along with prescribed medications to achieve LDL <70m, HDL >445m    Expected Outcomes  Short Term: Participant states understanding of desired cholesterol values and is compliant with medications prescribed. Participant is following exercise prescription and nutrition guidelines.;Long Term: Cholesterol controlled with medications as prescribed, with individualized exercise RX and with personalized nutrition plan. Value goals: LDL < 7058mHDL > 40 mg.       Core Components/Risk Factors/Patient Goals Review:  Goals and Risk Factor Review    Row Name 03/28/19 1022 04/15/19 1118 05/09/19 1056 05/30/19 1500       Core Components/Risk Factors/Patient Goals Review   Personal Goals Review  Weight Management/Obesity;Hypertension  Weight Management/Obesity;Hypertension  Weight Management/Obesity;Hypertension  Weight Management/Obesity;Hypertension    Review  Anthony Morrison doing well at home.  His weight has been staying steady around 148 lbs.  He is sticking to his diet and enjoyed the reinforcement he got from MelSherrillst week.  His blood pressures have been doing good.  Today he was 130/63 before his medications!! He has a follow up appointment with Dr. MilSabra Hecke is expecting a good report.   Anthony Morrison to do well.  His weight is holding steady.  His blood pressures continue to be good too.  His appointments went well. They  changed his BP med, but then he had an allergic reaction to it, so they changed it again.  Since then, he has been doing well with it.   Anthony Morrison to do well at home. His weight is still around 149-150 lbs.  His pressures have stayed good too.  He has also changed his cholesterol medication.  Overall, he is feeling good.  Anthony Morrison to stay on top of his weight and pressures.  He is feeling better since his medication changes.     Expected Outcomes  Short: Good follow up appointment with Dr. MilSabra HeckLong: Continue to monitor risk factors.   Short: Continue to monitor blood pressures on new med.  Long: Continue to maintain weight.   Short: Continue to maintain weight.  Long: Continue to monitor risk factors.   Short: Continue to maintain weight.  Long: Continue to monitor risk factors.        Core Components/Risk Factors/Patient Goals at Discharge (Final Review):  Goals and Risk Factor Review - 05/30/19 1500      Core Components/Risk Factors/Patient Goals Review   Personal Goals Review  Weight Management/Obesity;Hypertension    Review  Anthony Morrison to stay on top of his weight and pressures.  He is feeling better since his medication changes.     Expected Outcomes  Short: Continue to maintain weight.  Long: Continue to monitor risk factors.        ITP Comments: ITP Comments    Row Name 02/21/19 1229 03/09/19 1123 03/16/19 1217 07/06/19 1410     ITP Comments  Medical review completed. ITP sent to Dr MarEmily Filbertr review, changes as needed and signature. [facility]  Our program is currently closed due to COVID-19.  We are communicating with patient via phone calls and emails.    30 day review. Continue with ITP unless directed changes by Medical Director chart review.  30 day review cycle restarting  after being closed since March 16 because of  Covid 19 pandemic. Program opened to patients on July 6. Not all have returned. ITP updated  and sent to Medical Director for review,changes  as needed and signature       Comments:

## 2019-07-18 DIAGNOSIS — J432 Centrilobular emphysema: Secondary | ICD-10-CM | POA: Diagnosis not present

## 2019-07-22 ENCOUNTER — Encounter: Payer: Self-pay | Admitting: *Deleted

## 2019-07-22 ENCOUNTER — Other Ambulatory Visit: Payer: Self-pay

## 2019-07-22 ENCOUNTER — Encounter: Payer: PPO | Attending: Internal Medicine | Admitting: *Deleted

## 2019-07-22 DIAGNOSIS — Z951 Presence of aortocoronary bypass graft: Secondary | ICD-10-CM

## 2019-07-22 NOTE — Progress Notes (Signed)
Spoke with Anthony Morrison today. He is waiting for MD appt soon to talk about returning to the program.  Continue with Virtual until then.

## 2019-07-22 NOTE — Progress Notes (Signed)
Virtual Visit with Anthony Morrison.  He is doing well.  He is not ready to return to the on site program.

## 2019-08-03 DIAGNOSIS — I493 Ventricular premature depolarization: Secondary | ICD-10-CM | POA: Diagnosis not present

## 2019-08-03 DIAGNOSIS — I10 Essential (primary) hypertension: Secondary | ICD-10-CM | POA: Diagnosis not present

## 2019-08-03 DIAGNOSIS — I2510 Atherosclerotic heart disease of native coronary artery without angina pectoris: Secondary | ICD-10-CM | POA: Diagnosis not present

## 2019-08-03 DIAGNOSIS — I6523 Occlusion and stenosis of bilateral carotid arteries: Secondary | ICD-10-CM | POA: Diagnosis not present

## 2019-08-18 DIAGNOSIS — J432 Centrilobular emphysema: Secondary | ICD-10-CM | POA: Diagnosis not present

## 2019-08-31 ENCOUNTER — Encounter: Payer: Self-pay | Admitting: *Deleted

## 2019-08-31 DIAGNOSIS — Z951 Presence of aortocoronary bypass graft: Secondary | ICD-10-CM

## 2019-08-31 NOTE — Progress Notes (Signed)
Discharge Progress Report  Patient Details  Name: Anthony Morrison MRN: [id number] Date of Birth: Oct 22, 1941 Referring Provider:     Cardiac Rehab from 02/21/2019 in [facility] Cardiac and Pulmonary Rehab  Referring Provider  Emily Filbert MD [Attending Cardiologist: Dr. Serafina Royals       Number of Visits: 7  Reason for Discharge:  Early Exit:  Lack of attendance  Smoking History:  Social History   Tobacco Use  Smoking Status Former Smoker  . Packs/day: 1.00  . Years: 50.00  . Pack years: 50.00  . Types: Cigarettes  . Quit date: 01/06/2015  . Years since quitting: 4.6  Smokeless Tobacco Never Used    Diagnosis:  S/P CABG x 3  ADL UCSD:   Initial Exercise Prescription:   Discharge Exercise Prescription (Final Exercise Prescription Changes): Exercise Prescription Changes - 03/09/19 1100      Response to Exercise   Blood Pressure (Admit)  96/44    Blood Pressure (Exercise)  136/58    Blood Pressure (Exit)  128/62    Heart Rate (Admit)  61 bpm    Heart Rate (Exercise)  89 bpm    Heart Rate (Exit)  52 bpm    Rating of Perceived Exertion (Exercise)  11    Symptoms  none    Duration  Continue with 30 min of aerobic exercise without signs/symptoms of physical distress.    Intensity  THRR unchanged      Progression   Progression  Continue to progress workloads to maintain intensity without signs/symptoms of physical distress.    Average METs  2.48      Resistance Training   Training Prescription  Yes    Weight  3 lbs    Reps  10-15      Interval Training   Interval Training  No      Treadmill   MPH  1.5    Grade  0.5    Minutes  15    METs  2.25      NuStep   Level  3    Minutes  15    METs  2.7      Home Exercise Plan   Plans to continue exercise at  Home (comment)   walking and videos   Frequency  Add 2 additional days to program exercise sessions.    Initial Home Exercises Provided  03/09/19   mailed info      Functional  Capacity:   Psychological, QOL, Others - Outcomes: PHQ 2/9: Depression screen PHQ 2/9 02/21/2019  Decreased Interest 0  Down, Depressed, Hopeless 0  PHQ - 2 Score 0  Altered sleeping 0  Tired, decreased energy 1  Change in appetite 0  Feeling bad or failure about yourself  0  Trouble concentrating 0  Moving slowly or fidgety/restless 0  Suicidal thoughts 0  PHQ-9 Score 1  Difficult doing work/chores Somewhat difficult    Quality of Life:   Personal Goals: Goals established at orientation with interventions provided to work toward goal.    Personal Goals Discharge: Goals and Risk Factor Review    Row Name 03/28/19 1022 04/15/19 1118 05/09/19 1056 05/30/19 1500 07/22/19 1141     Core Components/Risk Factors/Patient Goals Review   Personal Goals Review  Weight Management/Obesity;Hypertension  Weight Management/Obesity;Hypertension  Weight Management/Obesity;Hypertension  Weight Management/Obesity;Hypertension  Weight Management/Obesity;Hypertension   Review  Anthony Morrison is doing well at home.  His weight has been staying steady around 148 lbs.  He is sticking to his diet and  enjoyed the reinforcement he got from Anthony Morrison last week.  His blood pressures have been doing good.  Today he was 130/63 before his medications!! He has a follow up appointment with Dr. Sabra Heck. He is expecting a good report.   Anthony Morrison continues to do well.  His weight is holding steady.  His blood pressures continue to be good too.  His appointments went well. They changed his BP med, but then he had an allergic reaction to it, so they changed it again.  Since then, he has been doing well with it.   Anthony Morrison continues to do well at home. His weight is still around 149-150 lbs.  His pressures have stayed good too.  He has also changed his cholesterol medication.  Overall, he is feeling good.  Anthony Morrison continues to stay on top of his weight and pressures.  He is feeling better since his medication changes.   Anthony Morrison is continung  to maintain his weight and BP control.  He ststaed that he does "cheat" with salt sometimes.  He is aware to watch for weight gain and BP elevation if too much salt on board.   Expected Outcomes  Short: Good follow up appointment with Dr. Sabra Heck.  Long: Continue to monitor risk factors.   Short: Continue to monitor blood pressures on new med.  Long: Continue to maintain weight.   Short: Continue to maintain weight.  Long: Continue to monitor risk factors.   Short: Continue to maintain weight.  Long: Continue to monitor risk factors.   Short: Continue to maintain weight.  Long: Continue to monitor risk factors.       Exercise Goals and Review:   Exercise Goals Re-Evaluation: Exercise Goals Re-Evaluation    Row Name 03/09/19 1123 03/28/19 1027 04/15/19 1055 05/09/19 1050 05/30/19 1458     Exercise Goal Re-Evaluation   Exercise Goals Review  Increase Physical Activity;Increase Strength and Stamina;Understanding of Exercise Prescription  Increase Physical Activity;Increase Strength and Stamina;Understanding of Exercise Prescription  Increase Physical Activity;Increase Strength and Stamina;Understanding of Exercise Prescription  Increase Physical Activity;Increase Strength and Stamina;Understanding of Exercise Prescription  Increase Physical Activity;Increase Strength and Stamina;Understanding of Exercise Prescription   Comments  Anthony Morrison is off to a good start in rehab.  He has completed three full days in rehab.  We will send out his home exercise in the mail.  He will be walking and following our videos at home.   We will continue to monitor his progress at home.   Anthony Morrison has been doing well at home.  He is walking outside 20-30 minutes each day.  He is feeling like his stamina is recovering well.  We will continue to monitor his progression at home.   Anthony Morrison is doing well at home.  He is staying active and walking every day.  He has enjoyed our emails and videos.  His energy levels continue to improve.    Anthony Morrison continues to do well at home.  He continues to walk daily.  He is also staying active with his work around the [employer] complex.    Anthony Morrison continues to stay busy at home. He is walking an staying busy around the complex.    Expected Outcomes  Short: Walk at home.  Long: Continue to move more.   Short: Continue to walk daily and use our videos some.  Long: Continue to increase activity at home.   Short: Continue to walk daily  Long: Continue to build stamina.   Short: Continue walking. Long; Continue to maintain  activity levels.   Short: Continue walking. Long; Continue to maintain activity levels.    [place] Name 07/22/19 1139             Exercise Goal Re-Evaluation   Exercise Goals Review  Increase Physical Activity;Increase Strength and Stamina       Comments  [place] states he is continuing to exercise at home. He is not ready to return to ON site program until after is physiocian appt next month. He continues to work on keeping his weight maintianed too.       Expected Outcomes  Short: Continue walking for his exercise and managing weight. Long; Continue to maintain activity levels.          Nutrition & Weight - Outcomes:    Nutrition: Nutrition Therapy & Goals - 03/22/19 1234      Nutrition Therapy   Diet  heart healthy, Low sodium 1400-1500kcal    Protein (specify units)  55-65g    Fiber  30 grams    Whole Grain Foods  3 servings    Saturated Fats  12 max. grams    Fruits and Vegetables  5 servings/day    Sodium  1.5 grams      Personal Nutrition Goals   Nutrition Goal  ST: move more at home during quarentine LT:get strength back    Comments  No salt shaker on table. 2 meals/day, sleeps in. B scrambled egg, oatmeal or grits or [employer] toast or sometimes english muffin with butter and preserves. S: trisuits with low fat cream cheese . D: chnages but last night had pineapple w/ cottage cheese and SOS(creamed beef) on [employer] toast . 2-3x/week will have chopped salad for dinner with oil and  vinegar. Discussed MyPlate.       Intervention Plan   Intervention  Prescribe, educate and counsel regarding individualized specific dietary modifications aiming towards targeted core components such as weight, hypertension, lipid management, diabetes, heart failure and other comorbidities.    Expected Outcomes  Short Term Goal: Understand basic principles of dietary content, such as calories, fat, sodium, cholesterol and nutrients.;Short Term Goal: A plan has been developed with personal nutrition goals set during dietitian appointment.;Long Term Goal: Adherence to prescribed nutrition plan.       Nutrition Discharge:   Education Questionnaire Score:   Goals reviewed with patient; copy given to patient.

## 2019-08-31 NOTE — Progress Notes (Signed)
Cardiac Individual Treatment Plan  Patient Details  Name: Anthony Morrison MRN: [id number] Date of Birth: Dec 15, 1941 Referring Provider:     Cardiac Rehab from 02/21/2019 in [facility] Cardiac and Pulmonary Rehab  Referring Provider  Emily Filbert MD [Attending Cardiologist: Dr. Serafina Royals      Initial Encounter Date:    Cardiac Rehab from 02/21/2019 in [facility] Cardiac and Pulmonary Rehab  Date  02/21/19      Visit Diagnosis: S/P CABG x 3  Patient's Home Medications on Admission:  Current Outpatient Medications:  .  aspirin EC 325 MG EC tablet, Take 1 tablet (325 mg total) by mouth daily. (Patient taking differently: Take 81 mg by mouth daily. ), Disp: , Rfl:  .  atorvastatin (LIPITOR) 20 MG tablet, Take 1 tablet (20 mg total) by mouth daily at 6 PM., Disp: 30 tablet, Rfl: 1 .  ferrous sulfate 325 (65 FE) MG tablet, Take 325 mg by mouth daily with breakfast., Disp: , Rfl:  .  Fluticasone-Salmeterol (ADVAIR DISKUS) 250-50 MCG/DOSE AEPB, Inhale 1 puff into the lungs 2 times daily at 12 noon and 4 pm., Disp: 1 each, Rfl: 1 .  furosemide (LASIX) 20 MG tablet, Take 1 tablet (20 mg total) by mouth daily for 7 days., Disp: 7 tablet, Rfl: 0 .  lisinopril (PRINIVIL,ZESTRIL) 10 MG tablet, Take 1 tablet (10 mg total) by mouth daily., Disp: 30 tablet, Rfl: 1 .  metoprolol tartrate (LOPRESSOR) 25 MG tablet, Take 1 tablet (25 mg total) by mouth 2 (two) times daily. (Patient taking differently: Take 50 mg by mouth 2 (two) times daily. ), Disp: 60 tablet, Rfl: 1 .  oxymetazoline (AFRIN) 0.05 % nasal spray, Place 1 spray into both nostrils at bedtime., Disp: , Rfl:  .  potassium chloride (KLOR-CON 10) 10 MEQ tablet, Take 1 tablet (10 mEq total) by mouth daily for 7 days., Disp: 7 tablet, Rfl: 0  Past Medical History: Past Medical History:  Diagnosis Date  . Arthritis   . Bronchitis   . 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 COPD (chronic obstructive pulmonary disease) (HCC)    MILD PER PT  . Pneumonia 2016     Tobacco Use: Social History   Tobacco Use  Smoking Status Former Smoker  . Packs/day: 1.00  . Years: 50.00  . Pack years: 50.00  . Types: Cigarettes  . Quit date: 01/06/2015  . Years since quitting: 4.6  Smokeless Tobacco Never Used    Labs: Recent Review Flowsheet Data    Labs for ITP Cardiac and Pulmonary Rehab Latest Ref Rng & Units 01/14/2019 01/14/2019 01/14/2019 01/15/2019 01/15/2019   PHART 7.350 - 7.450 7.393 7.289(L) 7.313(L) 7.295(L) 7.301(L)   PCO2ART 32.0 - 48.0 mmHg 40.4 49.1(H) 45.2 45.9 48.1(H)   HCO3 20.0 - 28.0 mmol/L 24.6 23.6 23.2 22.4 23.6   TCO2 22 - 32 mmol/L 26 25 25 24 25    ACIDBASEDEF 0.0 - 2.0 mmol/L - 3.0(H) 3.0(H) 4.0(H) 3.0(H)   O2SAT % 100.0 100.0 98.0 98.0 99.0       Exercise Target Goals: Exercise Program Goal: Individual exercise prescription set using results from initial 6 min walk test and THRR while considering  patient's activity barriers and safety.   Exercise Prescription Goal: Initial exercise prescription builds to 30-45 minutes a day of aerobic activity, 2-3 days per week.  Home exercise guidelines will be given to patient during program as part of exercise prescription that the participant will acknowledge.  Activity Barriers & Risk Stratification:  6 Minute Walk:   Oxygen Initial Assessment:   Oxygen Re-Evaluation:   Oxygen Discharge (Final Oxygen Re-Evaluation):   Initial Exercise Prescription:   Perform Capillary Blood Glucose checks as needed.  Exercise Prescription Changes: Exercise Prescription Changes    Row Name 03/09/19 1100             Response to Exercise   Blood Pressure (Admit)  96/44       Blood Pressure (Exercise)  136/58       Blood Pressure (Exit)  128/62       Heart Rate (Admit)  61 bpm       Heart Rate (Exercise)  89 bpm       Heart Rate (Exit)  52 bpm       Rating of Perceived Exertion (Exercise)  11       Symptoms  none       Duration  Continue with 30 min of aerobic exercise  without signs/symptoms of physical distress.       Intensity  THRR unchanged         Progression   Progression  Continue to progress workloads to maintain intensity without signs/symptoms of physical distress.       Average METs  2.48         Resistance Training   Training Prescription  Yes       Weight  3 lbs       Reps  10-15         Interval Training   Interval Training  No         Treadmill   MPH  1.5       Grade  0.5       Minutes  15       METs  2.25         NuStep   Level  3       Minutes  15       METs  2.7         Home Exercise Plan   Plans to continue exercise at  Home (comment) walking and videos       Frequency  Add 2 additional days to program exercise sessions.       Initial Home Exercises Provided  03/09/19 mailed info          Exercise Comments:   Exercise Goals and Review:   Exercise Goals Re-Evaluation : Exercise Goals Re-Evaluation    Row Name 03/09/19 1123 03/28/19 1027 04/15/19 1055 05/09/19 1050 05/30/19 1458     Exercise Goal Re-Evaluation   Exercise Goals Review  Increase Physical Activity;Increase Strength and Stamina;Understanding of Exercise Prescription  Increase Physical Activity;Increase Strength and Stamina;Understanding of Exercise Prescription  Increase Physical Activity;Increase Strength and Stamina;Understanding of Exercise Prescription  Increase Physical Activity;Increase Strength and Stamina;Understanding of Exercise Prescription  Increase Physical Activity;Increase Strength and Stamina;Understanding of Exercise Prescription   Comments  Anthony Morrison is off to a good start in rehab.  He has completed three full days in rehab.  We will send out his home exercise in the mail.  He will be walking and following our videos at home.   We will continue to monitor his progress at home.   Anthony Morrison has been doing well at home.  He is walking outside 20-30 minutes each day.  He is feeling like his stamina is recovering well.  We will continue to monitor  his progression at home.   Anthony Morrison is doing well at home.  He  is staying active and walking every day.  He has enjoyed our emails and videos.  His energy levels continue to improve.   Anthony Morrison continues to do well at home.  He continues to walk daily.  He is also staying active with his work around the [employer] complex.    Anthony Morrison continues to stay busy at home. He is walking an staying busy around the complex.    Expected Outcomes  Short: Walk at home.  Long: Continue to move more.   Short: Continue to walk daily and use our videos some.  Long: Continue to increase activity at home.   Short: Continue to walk daily  Long: Continue to build stamina.   Short: Continue walking. Long; Continue to maintain activity levels.   Short: Continue walking. Long; Continue to maintain activity levels.    [place] Name 07/22/19 1139             Exercise Goal Re-Evaluation   Exercise Goals Review  Increase Physical Activity;Increase Strength and Stamina       Comments  Anthony Morrison states he is continuing to exercise at home. He is not ready to return to ON site program until after is physiocian appt next month. He continues to work on keeping his weight maintianed too.       Expected Outcomes  Short: Continue walking for his exercise and managing weight. Long; Continue to maintain activity levels.          Discharge Exercise Prescription (Final Exercise Prescription Changes): Exercise Prescription Changes - 03/09/19 1100      Response to Exercise   Blood Pressure (Admit)  96/44    Blood Pressure (Exercise)  136/58    Blood Pressure (Exit)  128/62    Heart Rate (Admit)  61 bpm    Heart Rate (Exercise)  89 bpm    Heart Rate (Exit)  52 bpm    Rating of Perceived Exertion (Exercise)  11    Symptoms  none    Duration  Continue with 30 min of aerobic exercise without signs/symptoms of physical distress.    Intensity  THRR unchanged      Progression   Progression  Continue to progress workloads to maintain intensity without  signs/symptoms of physical distress.    Average METs  2.48      Resistance Training   Training Prescription  Yes    Weight  3 lbs    Reps  10-15      Interval Training   Interval Training  No      Treadmill   MPH  1.5    Grade  0.5    Minutes  15    METs  2.25      NuStep   Level  3    Minutes  15    METs  2.7      Home Exercise Plan   Plans to continue exercise at  Home (comment)   walking and videos   Frequency  Add 2 additional days to program exercise sessions.    Initial Home Exercises Provided  03/09/19   mailed info      Nutrition:  Target Goals: Understanding of nutrition guidelines, daily intake of sodium <1575m, cholesterol <2075m calories 30% from fat and 7% or less from saturated fats, daily to have 5 or more servings of fruits and vegetables.  Biometrics:    Nutrition Therapy Plan and Nutrition Goals: Nutrition Therapy & Goals - 03/22/19 1234      Nutrition Therapy  Diet  heart healthy, Low sodium 1400-1500kcal    Protein (specify units)  55-65g    Fiber  30 grams    Whole Grain Foods  3 servings    Saturated Fats  12 max. grams    Fruits and Vegetables  5 servings/day    Sodium  1.5 grams      Personal Nutrition Goals   Nutrition Goal  ST: move more at home during quarentine LT:get strength back    Comments  No salt shaker on table. 2 meals/day, sleeps in. B scrambled egg, oatmeal or grits or [employer] toast or sometimes english muffin with butter and preserves. S: trisuits with low fat cream cheese . D: chnages but last night had pineapple w/ cottage cheese and SOS(creamed beef) on [employer] toast . 2-3x/week will have chopped salad for dinner with oil and vinegar. Discussed MyPlate.       Intervention Plan   Intervention  Prescribe, educate and counsel regarding individualized specific dietary modifications aiming towards targeted core components such as weight, hypertension, lipid management, diabetes, heart failure and other comorbidities.    Expected  Outcomes  Short Term Goal: Understand basic principles of dietary content, such as calories, fat, sodium, cholesterol and nutrients.;Short Term Goal: A plan has been developed with personal nutrition goals set during dietitian appointment.;Long Term Goal: Adherence to prescribed nutrition plan.       Nutrition Assessments:   Nutrition Goals Re-Evaluation:   Nutrition Goals Discharge (Final Nutrition Goals Re-Evaluation):   Psychosocial: Target Goals: Acknowledge presence or absence of significant depression and/or stress, maximize coping skills, provide positive support system. Participant is able to verbalize types and ability to use techniques and skills needed for reducing stress and depression.   Initial Review & Psychosocial Screening:   Quality of Life Scores:   Scores of 19 and below usually indicate a poorer quality of life in these areas.  A difference of  2-3 points is a clinically meaningful difference.  A difference of 2-3 points in the total score of the Quality of Life Index has been associated with significant improvement in overall quality of life, self-image, physical symptoms, and general health in studies assessing change in quality of life.  PHQ-9: Recent Review Flowsheet Data    Depression screen [facility] 2/9 02/21/2019   Decreased Interest 0   Down, Depressed, Hopeless 0   PHQ - 2 Score 0   Altered sleeping 0   Tired, decreased energy 1   Change in appetite 0   Feeling bad or failure about yourself  0   Trouble concentrating 0   Moving slowly or fidgety/restless 0   Suicidal thoughts 0   PHQ-9 Score 1   Difficult doing work/chores Somewhat difficult     Interpretation of Total Score  Total Score Depression Severity:  1-4 = Minimal depression, 5-9 = Mild depression, 10-14 = Moderate depression, 15-19 = Moderately severe depression, 20-27 = Severe depression   Psychosocial Evaluation and Intervention:   Psychosocial Re-Evaluation: Psychosocial  Re-Evaluation    [place] Name 03/28/19 1009 04/15/19 1104 05/09/19 1053 05/30/19 1459       Psychosocial Re-Evaluation   Current issues with  None Identified  Current Stress Concerns  Current Stress Concerns  Current Stress Concerns    Comments  Anthony Morrison is doing well at home. He has an appointment today with Dr. Sabra Heck.  He is a little nervous going in but is planning to wear a mask for his protection. Overall, he is feeling well and coping well with  being home.   Anthony Morrison is doing well at home. He had a pulmonology visit this week and they started him on a daily nebulizer.  When I called he had been on the phone with insurance arguing about paying for it.  He wanted to pay all at once, versus monthly.  He was grateful for the call interruption.  Overall, he is doing well and has been enjoying our emails.  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    Anthony Morrison is still soing well.  He has d/c's his nebulizer as it was causing jitter and palpitations.  He continues to work around the complex.  His biggest worry right now is making sure roofs are covered with all the rain coming.   Anthony Morrison is feeling better.  He still has the roofing project going on, but hopes to have that wrapped up within the next week or so.  He is feeling good overall.     Expected Outcomes  Short: Have good follow up appointment today.  Long: Continue to cope well.   Short: Get new projects done.  Long: Continue to stay positive.   Short: Continue to manage projects.  Long: Continue to stay postive at home.   Short: Continue to manage projects.  Long: Continue to stay postive at home.     Interventions  Encouraged to attend Cardiac Rehabilitation for the exercise  Encouraged to attend Cardiac Rehabilitation for the exercise  Encouraged to attend Cardiac Rehabilitation for the exercise  -    Continue Psychosocial Services    Follow up required by staff  Follow up required by staff  Follow up required by staff  -       Psychosocial Discharge (Final Psychosocial Re-Evaluation): Psychosocial Re-Evaluation - 05/30/19 1459      Psychosocial Re-Evaluation   Current issues with  Current Stress Concerns    Comments  Anthony Morrison is feeling better.  He still has the roofing project going on, but hopes to have that wrapped up within the next week or so.  He is feeling good overall.     Expected Outcomes  Short: Continue to manage projects.  Long: Continue to stay postive at home.        Vocational Rehabilitation: Provide vocational rehab assistance to qualifying candidates.   Vocational Rehab Evaluation & Intervention:   Education: Education Goals: Education classes will be provided on a variety of topics geared toward better understanding of heart health and risk factor modification. Participant will state understanding/return demonstration of topics presented as noted by education test scores.  Learning Barriers/Preferences:   Education Topics:  AED/CPR: - Group verbal and written instruction with the use of models to demonstrate the basic use of the AED with the basic ABC's of resuscitation.   General Nutrition Guidelines/Fats and Fiber: -Group instruction provided by verbal, written material, models and posters to present the general guidelines for heart healthy nutrition. Gives an explanation and review of dietary fats and fiber.   Cardiac Rehab from 03/03/2019 in [facility] Cardiac and Pulmonary Rehab  Date  03/01/19  Educator  [facility]  Instruction Review Code  1- Verbalizes Understanding      Controlling Sodium/Reading Food Labels: -Group verbal and written material supporting the discussion of sodium use in heart healthy nutrition. Review and explanation with models, verbal and written materials for utilization of the food label.  Cardiac Rehab from 03/03/2019 in [facility] Cardiac and Pulmonary Rehab  Date  03/03/19   Educator  [facility]  Instruction Review Code  1- Verbalizes Understanding      Exercise Physiology & General Exercise Guidelines: - Group verbal and written instruction with models to review the exercise physiology of the cardiovascular system and associated critical values. Provides general exercise guidelines with specific guidelines to those with heart or lung disease.    Aerobic Exercise & Resistance Training: - Gives group verbal and written instruction on the various components of exercise. Focuses on aerobic and resistive training programs and the benefits of this training and how to safely progress through these programs..   Flexibility, Balance, Mind/Body Relaxation: Provides group verbal/written instruction on the benefits of flexibility and balance training, including mind/body exercise modes such as yoga, pilates and tai chi.  Demonstration and skill practice provided.   Stress and Anxiety: - Provides group verbal and written instruction about the health risks of elevated stress and causes of high stress.  Discuss the correlation between heart/lung disease and anxiety and treatment options. Review healthy ways to manage with stress and anxiety.   Depression: - Provides group verbal and written instruction on the correlation between heart/lung disease and depressed mood, treatment options, and the stigmas associated with seeking treatment.   Anatomy & Physiology of the Heart: - Group verbal and written instruction and models provide basic cardiac anatomy and physiology, with the coronary electrical and arterial systems. Review of Valvular disease and Heart Failure   Cardiac Procedures: - Group verbal and written instruction to review commonly prescribed medications for heart disease. Reviews the medication, class of the drug, and side effects. Includes the steps to properly store meds and maintain the prescription regimen. (beta blockers and nitrates)   Cardiac Medications  I: - Group verbal and written instruction to review commonly prescribed medications for heart disease. Reviews the medication, class of the drug, and side effects. Includes the steps to properly store meds and maintain the prescription regimen.   Cardiac Medications II: -Group verbal and written instruction to review commonly prescribed medications for heart disease. Reviews the medication, class of the drug, and side effects. (all other drug classes)   Cardiac Rehab from 03/03/2019 in [facility] Cardiac and Pulmonary Rehab  Date  02/24/19  Educator  CE  Instruction Review Code  1- Verbalizes Understanding       Go Sex-Intimacy & Heart Disease, Get SMART - Goal Setting: - Group verbal and written instruction through game format to discuss heart disease and the return to sexual intimacy. Provides group verbal and written material to discuss and apply goal setting through the application of the S.M.A.R.T. Method.   Other Matters of the Heart: - Provides group verbal, written materials and models to describe Stable Angina and Peripheral Artery. Includes description of the disease process and treatment options available to the cardiac patient.   Exercise & Equipment Safety: - Individual verbal instruction and demonstration of equipment use and safety with use of the equipment.   Cardiac Rehab from 03/03/2019 in [facility] Cardiac and Pulmonary Rehab  Date  02/21/19  Educator  [facility]  Instruction Review Code  1- Verbalizes Understanding      Infection Prevention: - Provides verbal and written material to individual with discussion of infection control including proper hand washing and proper equipment cleaning during exercise session.   Cardiac Rehab from 03/03/2019 in [facility] Cardiac and Pulmonary Rehab  Date  02/21/19  Educator  [facility]  Instruction Review Code  1- Verbalizes Understanding      Falls Prevention: - Provides verbal and written material to individual with discussion of falls prevention and  safety.   Cardiac Rehab from 03/03/2019 in [facility] Cardiac and Pulmonary Rehab  Date  02/21/19  Educator  [facility]  Instruction Review Code  1- Verbalizes Understanding      Diabetes: - Individual verbal and written instruction to review signs/symptoms of diabetes, desired ranges of glucose level fasting, after meals and with exercise. Acknowledge that pre and post exercise glucose checks will be done for 3 sessions at entry of program.   Know Your Numbers and Risk Factors: -Group verbal and written instruction about important numbers in your health.  Discussion of what are risk factors and how they play a role in the disease process.  Review of Cholesterol, Blood Pressure, Diabetes, and BMI and the role they play in your overall health.   Cardiac Rehab from 03/03/2019 in [facility] Cardiac and Pulmonary Rehab  Date  02/24/19  Educator  CE  Instruction Review Code  1- Verbalizes Understanding      Sleep Hygiene: -Provides group verbal and written instruction about how sleep can affect your health.  Define sleep hygiene, discuss sleep cycles and impact of sleep habits. Review good sleep hygiene tips.    Other: -Provides group and verbal instruction on various topics (see comments)   Knowledge Questionnaire Score:   Core Components/Risk Factors/Patient Goals at Admission:   Core Components/Risk Factors/Patient Goals Review:  Goals and Risk Factor Review    Row Name 03/28/19 1022 04/15/19 1118 05/09/19 1056 05/30/19 1500 07/22/19 1141     Core Components/Risk Factors/Patient Goals Review   Personal Goals Review  Weight Management/Obesity;Hypertension  Weight Management/Obesity;Hypertension  Weight Management/Obesity;Hypertension  Weight Management/Obesity;Hypertension  Weight Management/Obesity;Hypertension   Review  Anthony Morrison is doing well at home.  His weight has been staying steady around 148 lbs.  He is sticking to his diet and enjoyed the reinforcement he got from [place] last week.  His blood  pressures have been doing good.  Today he was 130/63 before his medications!! He has a follow up appointment with Dr. Sabra Heck. He is expecting a good report.   Anthony Morrison continues to do well.  His weight is holding steady.  His blood pressures continue to be good too.  His appointments went well. They changed his BP med, but then he had an allergic reaction to it, so they changed it again.  Since then, he has been doing well with it.   Anthony Morrison continues to do well at home. His weight is still around 149-150 lbs.  His pressures have stayed good too.  He has also changed his cholesterol medication.  Overall, he is feeling good.  Anthony Morrison continues to stay on top of his weight and pressures.  He is feeling better since his medication changes.   Anthony Morrison is continung to maintain his weight and BP control.  He ststaed that he does "cheat" with salt sometimes.  He is aware to watch for weight gain and BP elevation if too much salt on board.   Expected Outcomes  Short: Good follow up appointment with Dr. Sabra Heck.  Long: Continue to monitor risk factors.   Short: Continue to monitor blood pressures on new med.  Long: Continue to maintain weight.   Short: Continue to maintain weight.  Long: Continue to monitor risk factors.   Short: Continue to maintain weight.  Long: Continue to monitor risk factors.   Short: Continue to maintain weight.  Long:  Continue to monitor risk factors.       Core Components/Risk Factors/Patient Goals at Discharge (Final Review):  Goals and Risk Factor Review - 07/22/19 1141      Core Components/Risk Factors/Patient Goals Review   Personal Goals Review  Weight Management/Obesity;Hypertension    Review  Anthony Morrison is continung to maintain his weight and BP control.  He ststaed that he does "cheat" with salt sometimes.  He is aware to watch for weight gain and BP elevation if too much salt on board.    Expected Outcomes  Short: Continue to maintain weight.  Long: Continue to monitor risk factors.         ITP Comments: ITP Comments    Row Name 03/09/19 1123 03/16/19 1217 07/06/19 1410 07/22/19 1138 08/31/19 1453   ITP Comments  Our program is currently closed due to COVID-19.  We are communicating with patient via phone calls and emails.    30 day review. Continue with ITP unless directed changes by Medical Director chart review.  30 day review cycle restarting  after being closed since March 16 because of  Covid 19 pandemic. Program opened to patients on July 6. Not all have returned. ITP updated and sent to Medical Director for review,changes as needed and signature  Virtual visit  Pt never returned once we reopened after the COVID-19 closure.  Discharge ITP and  summary created and sent for review.      Comments: Discharge ITP

## 2019-09-01 ENCOUNTER — Ambulatory Visit (INDEPENDENT_AMBULATORY_CARE_PROVIDER_SITE_OTHER): Payer: PPO

## 2019-09-01 ENCOUNTER — Other Ambulatory Visit: Payer: Self-pay

## 2019-09-01 ENCOUNTER — Ambulatory Visit (INDEPENDENT_AMBULATORY_CARE_PROVIDER_SITE_OTHER): Payer: PPO | Admitting: Vascular Surgery

## 2019-09-01 ENCOUNTER — Encounter (INDEPENDENT_AMBULATORY_CARE_PROVIDER_SITE_OTHER): Payer: Self-pay

## 2019-09-01 DIAGNOSIS — I70213 Atherosclerosis of native arteries of extremities with intermittent claudication, bilateral legs: Secondary | ICD-10-CM | POA: Diagnosis not present

## 2019-09-01 DIAGNOSIS — I6523 Occlusion and stenosis of bilateral carotid arteries: Secondary | ICD-10-CM

## 2019-09-02 ENCOUNTER — Other Ambulatory Visit (INDEPENDENT_AMBULATORY_CARE_PROVIDER_SITE_OTHER): Payer: Self-pay | Admitting: Vascular Surgery

## 2019-09-02 DIAGNOSIS — I6521 Occlusion and stenosis of right carotid artery: Secondary | ICD-10-CM

## 2019-09-02 DIAGNOSIS — I739 Peripheral vascular disease, unspecified: Secondary | ICD-10-CM

## 2019-09-12 ENCOUNTER — Other Ambulatory Visit: Payer: Self-pay

## 2019-09-12 ENCOUNTER — Encounter (INDEPENDENT_AMBULATORY_CARE_PROVIDER_SITE_OTHER): Payer: Self-pay | Admitting: Vascular Surgery

## 2019-09-12 ENCOUNTER — Ambulatory Visit (INDEPENDENT_AMBULATORY_CARE_PROVIDER_SITE_OTHER): Payer: PPO | Admitting: Vascular Surgery

## 2019-09-12 VITALS — BP 193/79 | HR 64 | Resp 12 | Ht 66.0 in | Wt 159.0 lb

## 2019-09-12 DIAGNOSIS — E782 Mixed hyperlipidemia: Secondary | ICD-10-CM | POA: Diagnosis not present

## 2019-09-12 DIAGNOSIS — I739 Peripheral vascular disease, unspecified: Secondary | ICD-10-CM | POA: Diagnosis not present

## 2019-09-12 DIAGNOSIS — I2089 Other forms of angina pectoris: Secondary | ICD-10-CM

## 2019-09-12 DIAGNOSIS — I10 Essential (primary) hypertension: Secondary | ICD-10-CM

## 2019-09-12 DIAGNOSIS — I208 Other forms of angina pectoris: Secondary | ICD-10-CM

## 2019-09-12 DIAGNOSIS — I6523 Occlusion and stenosis of bilateral carotid arteries: Secondary | ICD-10-CM | POA: Diagnosis not present

## 2019-09-12 NOTE — Progress Notes (Signed)
MRN : [id number]  Anthony Morrison is a 78 y.o. (1941/12/20) male who presents with chief complaint of  Chief Complaint  Patient presents with   Follow-up  .  History of Present Illness:   The patient is seen in follow up for carotid stenosis.  The patient denies amaurosis fugax. There is no recent history of TIA symptoms or focal motor deficits. There is no prior documented CVA.  There is no history of migraine headaches. There is no history of seizures.   The patient is also seen for evaluation of painful lower extremities and diminished pulses. Patient notes the pain is always associated with activity and is very consistent day today. Typically, the pain occurs at less than one block, progress is as activity continues to the point that the patient must stop walking. Resting including standing still for several minutes allowed resumption of the activity and the ability to walk a similar distance before stopping again. Uneven terrain and inclined shorten the distance. The pain has been progressive over the past several years. The patient denies inability to walk as having a negative impact on quality of life and daily activities.  The patient denies rest pain or dangling of an extremity off the side of the bed during the night for relief. No open wounds or sores at this time. No prior interventions or surgeries.  The patient denies history of DVT, PE or superficial thrombophlebitis. The patient denies recent episodes of angina or shortness of breath.   The patient is taking enteric-coated aspirin 81 mg daily.  The patient has a history of coronary artery disease s/p recent CABG right GSV harvested, no recent episodes of angina or shortness of breath. There is a history of hyperlipidemia which is being treated with a statin.   Duplex ultrasound of the carotid arteries chronic occlusion of the right carotid and <41% LICA stenosis. No change compared to last study.  ABI's  Rt=0.68 and Lt=0.83, no change compared to last study  No outpatient medications have been marked as taking for the 09/12/19 encounter (Office Visit) with Delana Meyer, Dolores Lory, MD.    Past Medical History:  Diagnosis Date   Arthritis    Bronchitis    Cancer ([place])    SKIN CANCERS   COPD (chronic obstructive pulmonary disease) ([place])    MILD PER PT   Pneumonia 2016    Past Surgical History:  Procedure Laterality Date   COLONOSCOPY  2015   CORONARY ARTERY BYPASS GRAFT N/A 01/14/2019   Procedure: Emergency Coronary Artery Bypass Grafting (CABG)  times three using left internal mammary artery to LAD, reversed saphenous vein graft to OM and RCA;  Surgeon: Grace Isaac, MD;  Location: [facility];  Service: Open Heart Surgery;  Laterality: N/A;   ENDOVEIN HARVEST OF GREATER SAPHENOUS VEIN Right 01/14/2019   Procedure: ENDOVEIN HARVEST OF GREATER SAPHENOUS VEIN;  Surgeon: Grace Isaac, MD;  Location: [facility];  Service: Open Heart Surgery;  Laterality: Right;   EYE SURGERY     CATARACTS BIL   LEFT HEART CATH AND CORONARY ANGIOGRAPHY Left 01/14/2019   Procedure: LEFT HEART CATH AND CORONARY ANGIOGRAPHY;  Surgeon: Corey Skains, MD;  Location: [facility];  Service: Cardiovascular;  Laterality: Left;   TEE WITHOUT CARDIOVERSION N/A 01/14/2019   Procedure: TRANSESOPHAGEAL ECHOCARDIOGRAM (TEE);  Surgeon: Grace Isaac, MD;  Location: [facility];  Service: Open Heart Surgery;  Laterality: N/A;    Social History Social History   Tobacco Use  Smoking status: Former Smoker    Packs/day: 1.00    Years: 50.00    Pack years: 50.00    Types: Cigarettes    Quit date: 01/06/2015    Years since quitting: 4.6   Smokeless tobacco: Never Used  Substance Use Topics   Alcohol use: Yes    Comment: OCC    Drug use: Never    Family History No family history on file.  Allergies  Allergen Reactions   Sulfa Antibiotics Other (See Comments)    Unknown     REVIEW OF  SYSTEMS (Negative unless checked)  Constitutional: [] Weight loss  [] Fever  [] Chills Cardiac: [] Chest pain   [] Chest pressure   [] Palpitations   [] Shortness of breath when laying flat   [] Shortness of breath with exertion. Vascular:  [x] Pain in legs with walking   [] Pain in legs at rest  [] History of DVT   [] Phlebitis   [x] Swelling in legs   [] Varicose veins   [] Non-healing ulcers Pulmonary:   [] Uses home oxygen   [] Productive cough   [] Hemoptysis   [] Wheeze  [] COPD   [] Asthma Neurologic:  [] Dizziness   [] Seizures   [] History of stroke   [] History of TIA  [] Aphasia   [] Vissual changes   [] Weakness or numbness in arm   [] Weakness or numbness in leg Musculoskeletal:   [] Joint swelling   [] Joint pain   [] Low back pain Hematologic:  [] Easy bruising  [] Easy bleeding   [] Hypercoagulable state   [] Anemic Gastrointestinal:  [] Diarrhea   [] Vomiting  [] Gastroesophageal reflux/heartburn   [] Difficulty swallowing. Genitourinary:  [] Chronic kidney disease   [] Difficult urination  [] Frequent urination   [] Blood in urine Skin:  [] Rashes   [] Ulcers  Psychological:  [] History of anxiety   []  History of major depression.  Physical Examination  Vitals:   09/12/19 1403  BP: (!) 193/79  Pulse: 64  Resp: 12  Weight: 159 lb (72.1 kg)  Height: 5\' 6"  (1.676 m)   Body mass index is 25.66 kg/m. Gen: WD/WN, NAD Head: [place]/AT, No temporalis wasting.  Ear/Nose/Throat: Hearing grossly intact, nares w/o erythema or drainage Eyes: PER, EOMI, sclera nonicteric.  Neck: Supple, no large masses.   Pulmonary:  Good air movement, no audible wheezing bilaterally, no use of accessory muscles.  Cardiac: RRR, no JVD Vascular:  Bilateral carotid bruits noted Vessel Right Left  Radial Palpable Palpable  PT Palpable Palpable  DP Palpable Palpable  Gastrointestinal: Non-distended. No guarding/no peritoneal signs.  Musculoskeletal: M/S 5/5 throughout.  No deformity or atrophy.  Neurologic: CN 2-12 intact. Symmetrical.  Speech  is fluent. Motor exam as listed above. Psychiatric: Judgment intact, Mood & affect appropriate for pt's clinical situation. Dermatologic: No rashes or ulcers noted.  No changes consistent with cellulitis. Lymph : No lichenification or skin changes of chronic lymphedema.  CBC Lab Results  Component Value Date   WBC 9.6 01/18/2019   HGB 9.9 (L) 01/18/2019   HCT 29.5 (L) 01/18/2019   MCV 92.5 01/18/2019   PLT 146 (L) 01/18/2019    BMET    Component Value Date/Time   NA 136 01/18/2019 0327   K 3.7 01/18/2019 0327   CL 101 01/18/2019 0327   CO2 23 01/18/2019 0327   GLUCOSE 94 01/18/2019 0327   BUN 15 01/18/2019 0327   CREATININE 1.20 01/18/2019 0327   CALCIUM 8.0 (L) 01/18/2019 0327   GFRNONAA 58 (L) 01/18/2019 0327   GFRAA >60 01/18/2019 0327   CrCl cannot be calculated (Patient's most recent lab result is older than  the maximum 21 days allowed.).  COAG Lab Results  Component Value Date   INR 1.73 01/14/2019   INR 1.11 01/14/2019    Radiology Vas [place] Abi With/wo Tbi  Result Date: 09/05/2019 LOWER EXTREMITY DOPPLER STUDY Indications: Peripheral artery disease.  Comparison Study: 01/14/2019 Outside study rt = .55 ; lt = .85 Performing Technologist: Concha Norway RVT  Examination Guidelines: A complete evaluation includes at minimum, Doppler waveform signals and systolic blood pressure reading at the level of bilateral brachial, anterior tibial, and posterior tibial arteries, when vessel segments are accessible. Bilateral testing is considered an integral part of a complete examination. Photoelectric Plethysmograph (PPG) waveforms and toe systolic pressure readings are included as required and additional duplex testing as needed. Limited examinations for reoccurring indications may be performed as noted.  ABI Findings: +---------+------------------+-----+----------+--------+  Right     Rt Pressure (mmHg) Index Waveform   Comment    +---------+------------------+-----+----------+--------+  Brachial  162                                           +---------+------------------+-----+----------+--------+  ATA       107                0.63  monophasic           +---------+------------------+-----+----------+--------+  PTA       116                0.68  monophasic           +---------+------------------+-----+----------+--------+  Great Toe 62                 0.36  Abnormal             +---------+------------------+-----+----------+--------+ +---------+------------------+-----+---------+-------+  Left      Lt Pressure (mmHg) Index Waveform  Comment  +---------+------------------+-----+---------+-------+  Brachial  171                                         +---------+------------------+-----+---------+-------+  ATA       131                0.77  triphasic          +---------+------------------+-----+---------+-------+  PTA       142                0.83  triphasic          +---------+------------------+-----+---------+-------+  Great Toe 100                0.58  Normal             +---------+------------------+-----+---------+-------+ +-------+-----------+-----------+------------+------------+  ABI/TBI Today's ABI Today's TBI Previous ABI Previous TBI  +-------+-----------+-----------+------------+------------+  Right   .68         .36         .55                        +-------+-----------+-----------+------------+------------+  Left    .83         .58         .85                        +-------+-----------+-----------+------------+------------+  Summary: Right: Resting  right ankle-brachial index indicates moderate right lower extremity arterial disease. The right toe-brachial index is abnormal. Left: Resting left ankle-brachial index indicates mild left lower extremity arterial disease. The left toe-brachial index is abnormal.  *See table(s) above for measurements and observations.  Electronically signed by Hortencia Pilar MD on 09/05/2019 at  5:26:15 PM.   Final    Vas US Carotid  Result Date: 09/05/2019 Carotid Arterial Duplex Study Indications: Carotid artery disease and Rt ICA occlusion hx. Performing Technologist: Concha [place] RVT  Examination Guidelines: A complete evaluation includes B-mode imaging, spectral Doppler, color Doppler, and power Doppler as needed of all accessible portions of each vessel. Bilateral testing is considered an integral part of a complete examination. Limited examinations for reoccurring indications may be performed as noted.  Right Carotid Findings: +--------+--------+--------+--------+------------------+--------+           PSV cm/s EDV cm/s Stenosis Plaque Description Comments  +--------+--------+--------+--------+------------------+--------+  CCA Mid  68       5                                              +--------+--------+--------+--------+------------------+--------+  ICA Prox                   Occluded                              +--------+--------+--------+--------+------------------+--------+  ECA      160      20                                             +--------+--------+--------+--------+------------------+--------+ +----------+--------+-------+----------------+-------------------+             PSV cm/s EDV cms Describe         Arm Pressure (mmHG)  +----------+--------+-------+----------------+-------------------+  Subclavian 119              Multiphasic, WNL                      +----------+--------+-------+----------------+-------------------+ +---------+--------+--+--------+---------+  Vertebral PSV cm/s 50 EDV cm/s Antegrade  +---------+--------+--+--------+---------+  Left Carotid Findings: +----------+--------+--------+--------+----------------------+--------+             PSV cm/s EDV cm/s Stenosis Plaque Description     Comments  +----------+--------+--------+--------+----------------------+--------+  CCA Prox   78       21                                                  +----------+--------+--------+--------+----------------------+--------+  CCA Mid    85       20                                                 +----------+--------+--------+--------+----------------------+--------+  CCA Distal 78       23                                                 +----------+--------+--------+--------+----------------------+--------+  ICA Prox   82       27                                                 +----------+--------+--------+--------+----------------------+--------+  ICA Mid    93       32                calcific and irregular           +----------+--------+--------+--------+----------------------+--------+  ICA Distal 91       28                                                 +----------+--------+--------+--------+----------------------+--------+  ECA        226      32       >50%     calcific and irregular           +----------+--------+--------+--------+----------------------+--------+ +----------+--------+--------+----------------+-------------------+             PSV cm/s EDV cm/s Describe         Arm Pressure (mmHG)  +----------+--------+--------+----------------+-------------------+  Subclavian 126               Multiphasic, WNL                      +----------+--------+--------+----------------+-------------------+ +---------+--------+--+--------+---------+  Vertebral PSV cm/s 57 EDV cm/s Antegrade  +---------+--------+--+--------+---------+  Summary: Right Carotid: Evidence consistent with a total occlusion of the right ICA. CCA,                ECA, vertebral patent. Left Carotid: Velocities in the left ICA are consistent with a 1-39% stenosis.               The ECA appears >50% stenosed. ICA/CCA ratio = 1.09. Vertebrals:  Bilateral vertebral arteries demonstrate antegrade flow. Subclavians: Normal flow hemodynamics were seen in bilateral subclavian              arteries. *See table(s) above for measurements and observations.  Electronically signed by Hortencia Pilar MD on  09/05/2019 at 5:26:13 PM.    Final      Assessment/Plan 1. Bilateral carotid artery stenosis Recommend:  Given the patient's asymptomatic subcritical stenosis no further invasive testing or surgery at this time.  Duplex ultrasound shows Occlusion of the RICA and 93-81% LICA.  Continue antiplatelet therapy as prescribed Continue management of CAD, HTN and Hyperlipidemia Healthy heart diet,  encouraged exercise at least 4 times per week Follow up in 12 months with duplex ultrasound and physical exam  - VAS US CAROTID; Future  2. PAD (peripheral artery disease) (HCC) Recommend:  The patient has evidence of atherosclerosis of the lower extremities with claudication.  The patient does not voice lifestyle limiting changes at this point in time.  Noninvasive studies do not suggest clinically significant change.  No invasive studies, angiography or surgery at this time The patient should continue walking and begin a more formal exercise program.  The patient should continue antiplatelet therapy and aggressive treatment of the lipid abnormalities  No changes in the patient's medications at this time  The patient should continue wearing graduated compression socks 10-15 mmHg strength to control the mild edema.  -  VAS [place] ABI WITH/WO TBI; Future  3. Stable angina (HCC) Continue cardiac and antihypertensive medications as already ordered and reviewed, no changes at this time.  Continue statin as ordered and reviewed, no changes at this time  Nitrates PRN for chest pain   4. Benign essential HTN Continue antihypertensive medications as already ordered, these medications have been reviewed and there are no changes at this time.   5. Hyperlipidemia, mixed Continue statin as ordered and reviewed, no changes at this time     Hortencia Pilar, MD  09/12/2019 2:04 PM

## 2019-09-18 DIAGNOSIS — J432 Centrilobular emphysema: Secondary | ICD-10-CM | POA: Diagnosis not present

## 2019-09-20 DIAGNOSIS — E782 Mixed hyperlipidemia: Secondary | ICD-10-CM | POA: Diagnosis not present

## 2019-09-20 DIAGNOSIS — E538 Deficiency of other specified B group vitamins: Secondary | ICD-10-CM | POA: Diagnosis not present

## 2019-09-27 DIAGNOSIS — E538 Deficiency of other specified B group vitamins: Secondary | ICD-10-CM | POA: Diagnosis not present

## 2019-09-27 DIAGNOSIS — Z23 Encounter for immunization: Secondary | ICD-10-CM | POA: Diagnosis not present

## 2019-09-27 DIAGNOSIS — I739 Peripheral vascular disease, unspecified: Secondary | ICD-10-CM | POA: Diagnosis not present

## 2019-09-27 DIAGNOSIS — E782 Mixed hyperlipidemia: Secondary | ICD-10-CM | POA: Diagnosis not present

## 2019-09-28 IMAGING — DX DG CHEST 2V
2 series · 2 of 2 positions shown · non-contrast
Comparison: Chest x-ray dated January 18, 2019.

CLINICAL DATA: Status post CABG.  No chest complaints.

EXAM:
CHEST - 2 VIEW

[dg chest 2 view (1 of 2)]
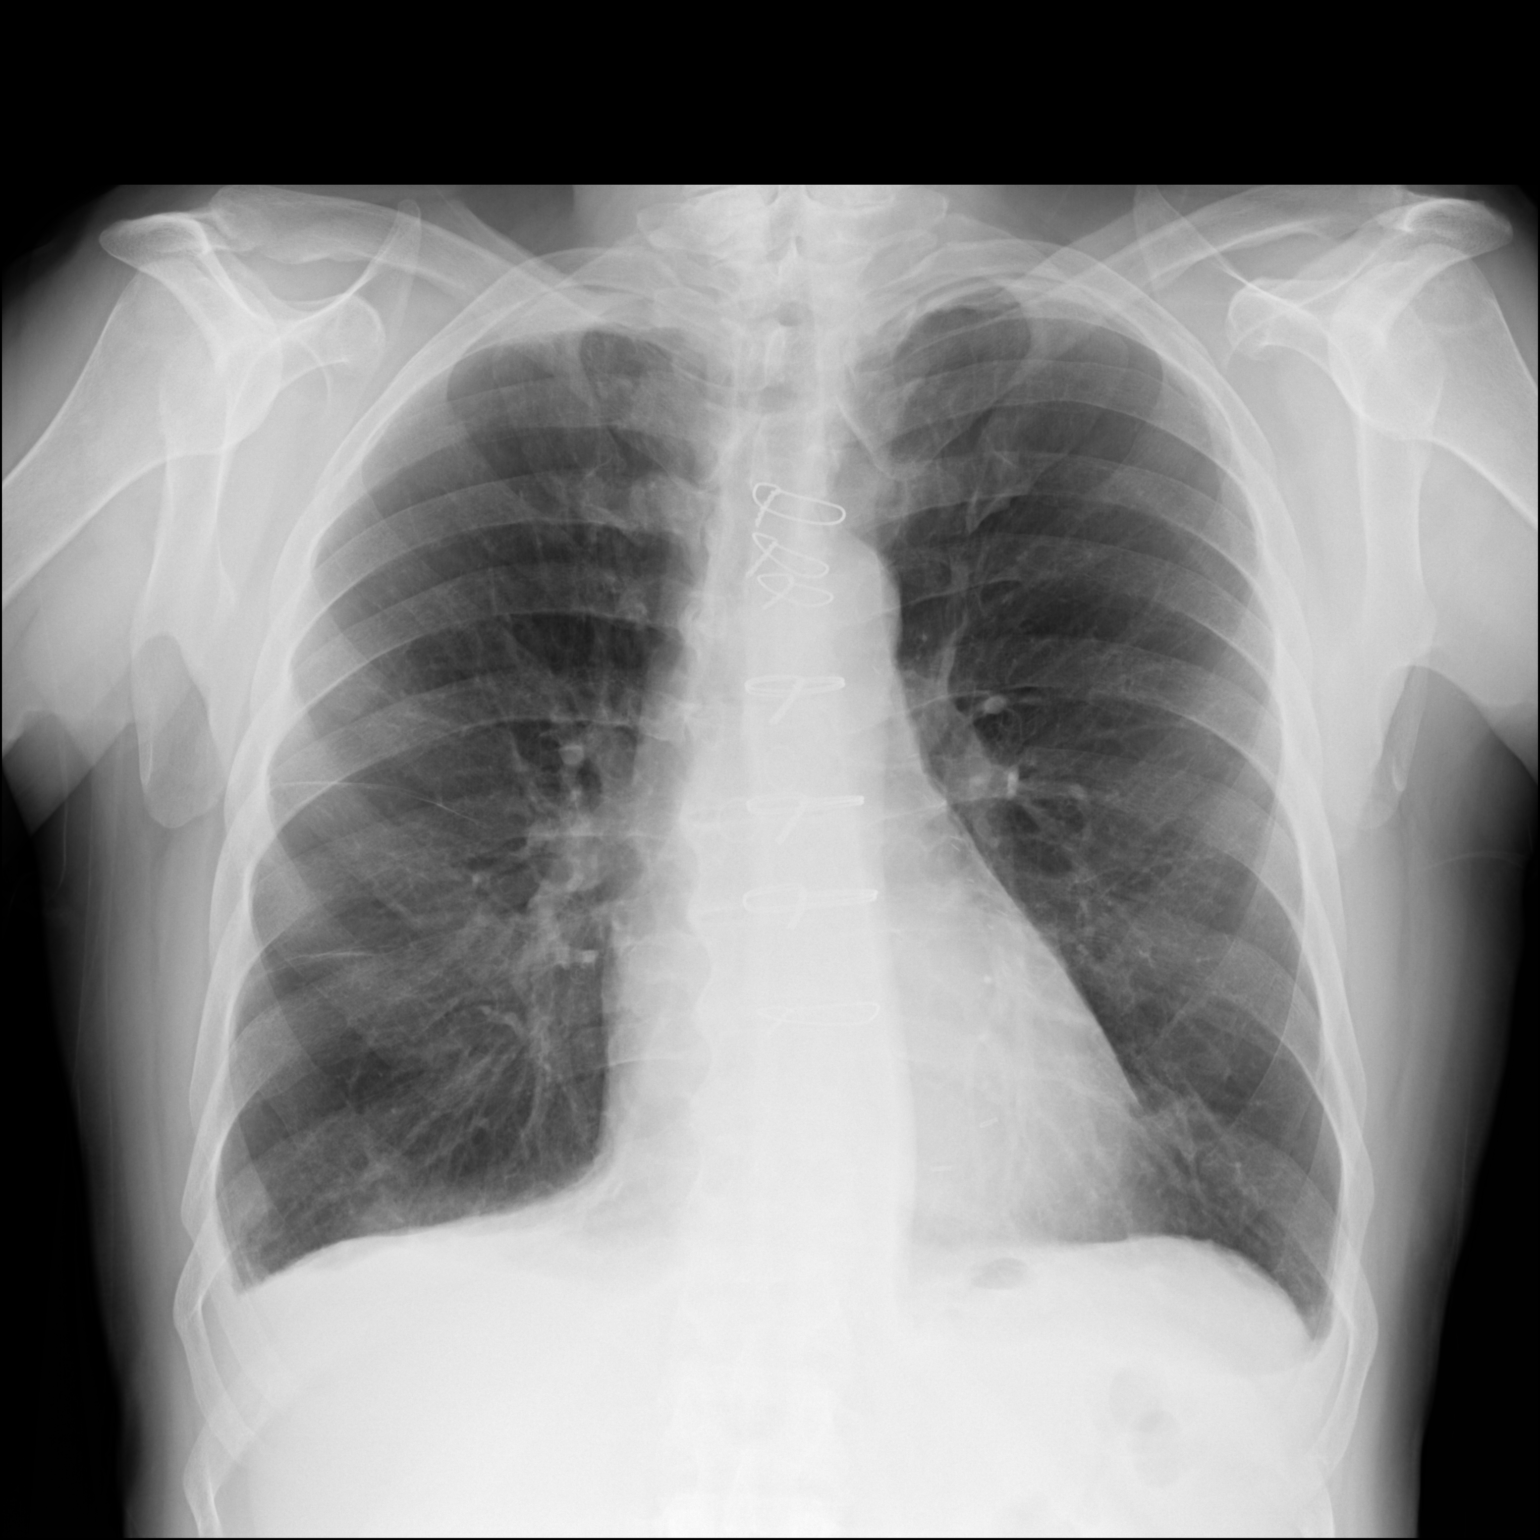

[dg chest 2 view (2 of 2)]
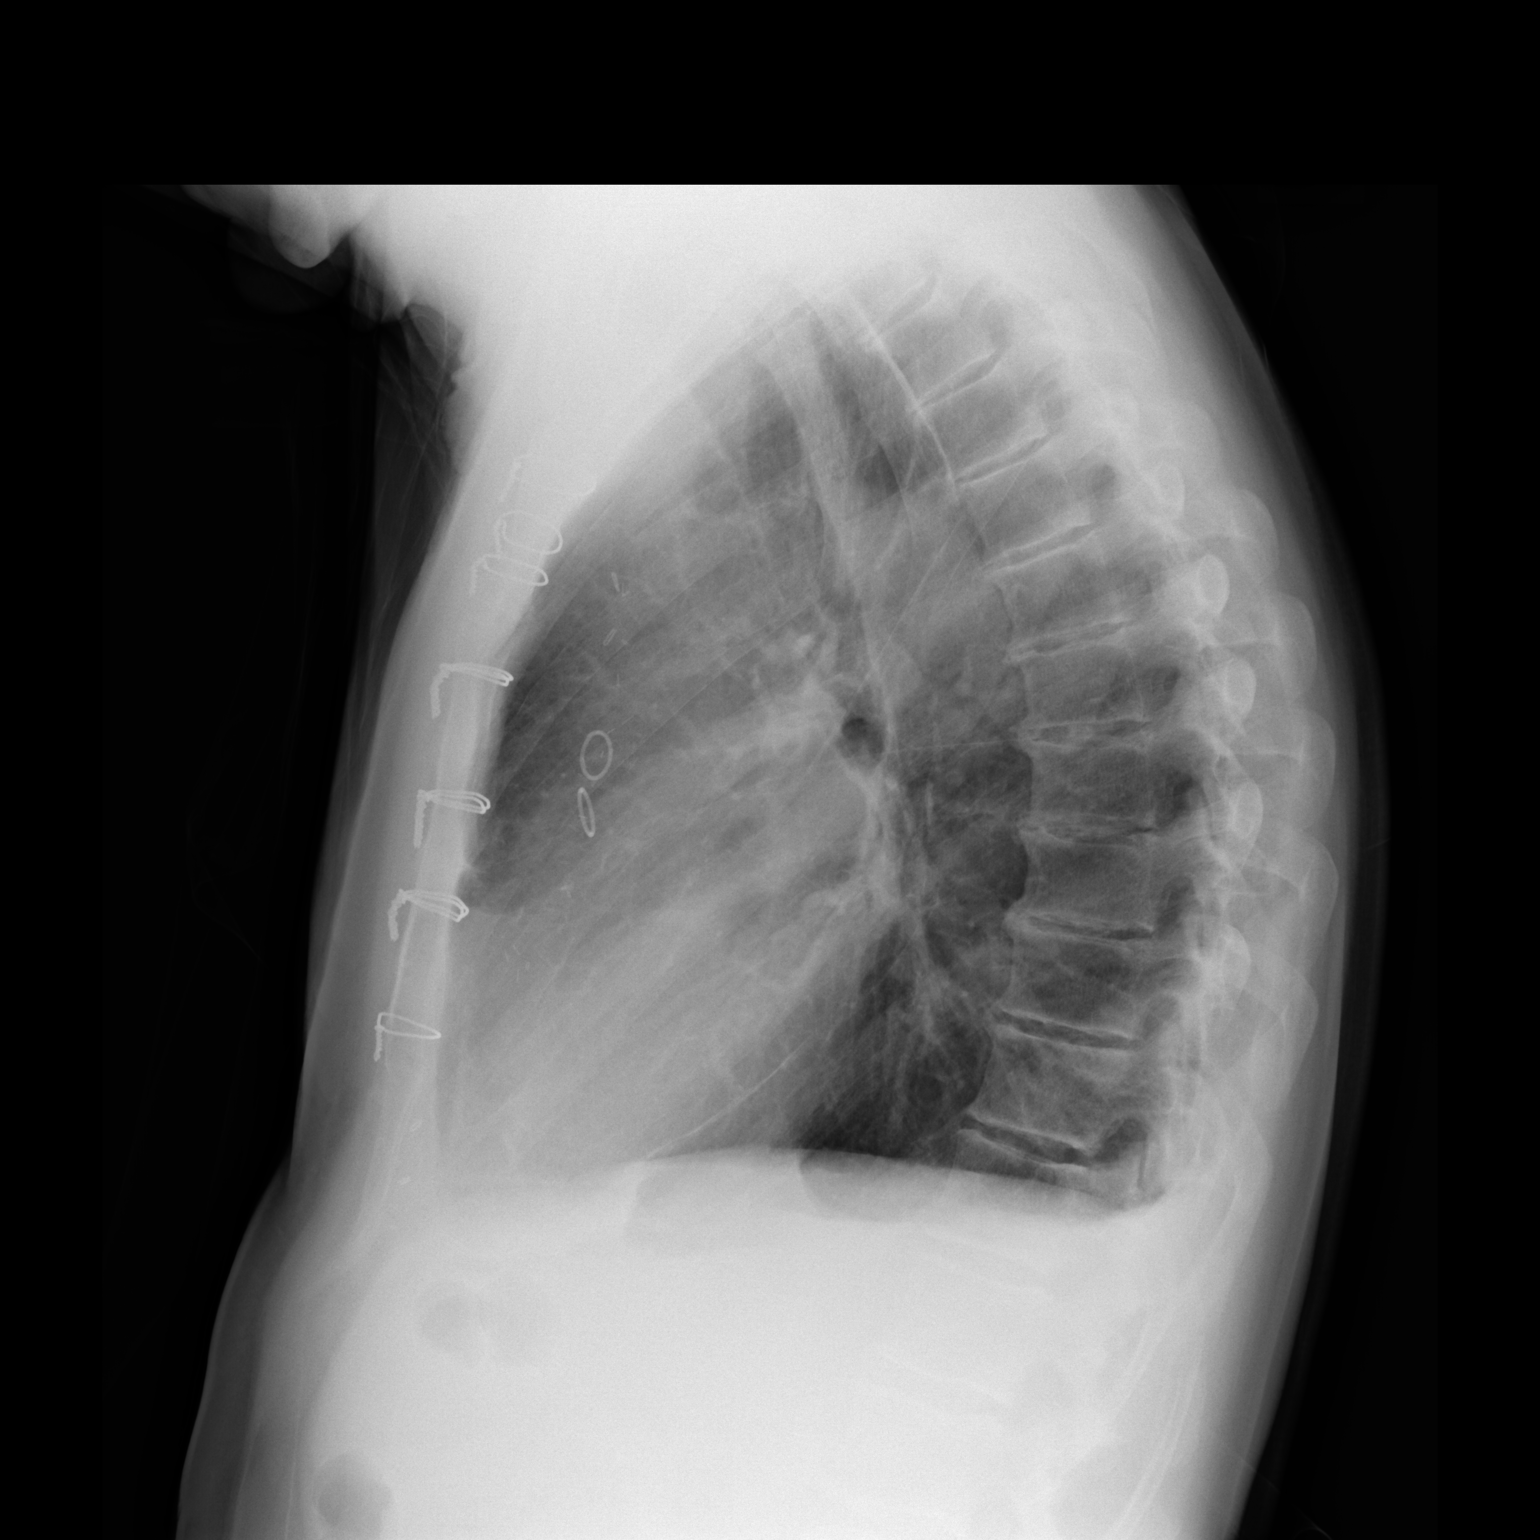

[2 of 2 positions shown; findings below may reference images not displayed]

FINDINGS: The heart size and mediastinal contours are within normal limits.
Prior CABG. Normal pulmonary vascularity. Decreasing now trace
bilateral pleural effusions. Improved aeration of the right lower
lobe. No consolidation or pneumothorax. No acute osseous
abnormality.
IMPRESSION: 1. Decreasing now trace bilateral pleural effusions with improved
aeration of the right lower lobe.
2. Resolved left apical pneumothorax.

## 2019-10-18 DIAGNOSIS — J432 Centrilobular emphysema: Secondary | ICD-10-CM | POA: Diagnosis not present

## 2019-11-18 DIAGNOSIS — J432 Centrilobular emphysema: Secondary | ICD-10-CM | POA: Diagnosis not present

## 2019-12-07 DIAGNOSIS — R42 Dizziness and giddiness: Secondary | ICD-10-CM | POA: Diagnosis not present

## 2019-12-07 DIAGNOSIS — E782 Mixed hyperlipidemia: Secondary | ICD-10-CM | POA: Diagnosis not present

## 2019-12-07 DIAGNOSIS — I10 Essential (primary) hypertension: Secondary | ICD-10-CM | POA: Diagnosis not present

## 2019-12-07 DIAGNOSIS — I6523 Occlusion and stenosis of bilateral carotid arteries: Secondary | ICD-10-CM | POA: Diagnosis not present

## 2019-12-07 DIAGNOSIS — I2510 Atherosclerotic heart disease of native coronary artery without angina pectoris: Secondary | ICD-10-CM | POA: Diagnosis not present

## 2019-12-18 DIAGNOSIS — J432 Centrilobular emphysema: Secondary | ICD-10-CM | POA: Diagnosis not present

## 2020-01-10 DIAGNOSIS — U071 COVID-19: Secondary | ICD-10-CM | POA: Diagnosis not present

## 2020-01-10 DIAGNOSIS — J208 Acute bronchitis due to other specified organisms: Secondary | ICD-10-CM | POA: Diagnosis not present

## 2020-01-10 DIAGNOSIS — I739 Peripheral vascular disease, unspecified: Secondary | ICD-10-CM | POA: Diagnosis not present

## 2020-01-10 DIAGNOSIS — I471 Supraventricular tachycardia: Secondary | ICD-10-CM | POA: Diagnosis not present

## 2020-01-12 DIAGNOSIS — J431 Panlobular emphysema: Secondary | ICD-10-CM | POA: Diagnosis not present

## 2020-01-12 DIAGNOSIS — J208 Acute bronchitis due to other specified organisms: Secondary | ICD-10-CM | POA: Diagnosis not present

## 2020-01-12 DIAGNOSIS — Z8616 Personal history of COVID-19: Secondary | ICD-10-CM | POA: Insufficient documentation

## 2020-01-12 DIAGNOSIS — U071 COVID-19: Secondary | ICD-10-CM | POA: Diagnosis not present

## 2020-01-13 DIAGNOSIS — U071 COVID-19: Secondary | ICD-10-CM | POA: Diagnosis not present

## 2020-01-13 DIAGNOSIS — J431 Panlobular emphysema: Secondary | ICD-10-CM | POA: Diagnosis not present

## 2020-01-13 DIAGNOSIS — J1282 Pneumonia due to coronavirus disease 2019: Secondary | ICD-10-CM | POA: Diagnosis not present

## 2020-01-13 DIAGNOSIS — J189 Pneumonia, unspecified organism: Secondary | ICD-10-CM | POA: Diagnosis not present

## 2020-01-16 DIAGNOSIS — J1282 Pneumonia due to coronavirus disease 2019: Secondary | ICD-10-CM | POA: Diagnosis not present

## 2020-01-16 DIAGNOSIS — U071 COVID-19: Secondary | ICD-10-CM | POA: Diagnosis not present

## 2020-01-18 DIAGNOSIS — J441 Chronic obstructive pulmonary disease with (acute) exacerbation: Secondary | ICD-10-CM | POA: Diagnosis not present

## 2020-01-18 DIAGNOSIS — J432 Centrilobular emphysema: Secondary | ICD-10-CM | POA: Diagnosis not present

## 2020-01-18 DIAGNOSIS — J208 Acute bronchitis due to other specified organisms: Secondary | ICD-10-CM | POA: Diagnosis not present

## 2020-01-18 DIAGNOSIS — U071 COVID-19: Secondary | ICD-10-CM | POA: Diagnosis not present

## 2020-01-20 DIAGNOSIS — J441 Chronic obstructive pulmonary disease with (acute) exacerbation: Secondary | ICD-10-CM | POA: Diagnosis not present

## 2020-01-22 ENCOUNTER — Emergency Department: Payer: PPO

## 2020-01-22 ENCOUNTER — Observation Stay: Payer: PPO

## 2020-01-22 ENCOUNTER — Inpatient Hospital Stay
Admission: EM | Admit: 2020-01-22 | Discharge: 2020-01-24 | DRG: 871 | Disposition: A | Discharge status: Other Acute Inpatient Hospital | Payer: PPO | Attending: Internal Medicine | Admitting: Internal Medicine

## 2020-01-22 ENCOUNTER — Other Ambulatory Visit: Payer: Self-pay

## 2020-01-22 DIAGNOSIS — Z951 Presence of aortocoronary bypass graft: Secondary | ICD-10-CM | POA: Diagnosis not present

## 2020-01-22 DIAGNOSIS — J440 Chronic obstructive pulmonary disease with acute lower respiratory infection: Secondary | ICD-10-CM | POA: Diagnosis present

## 2020-01-22 DIAGNOSIS — R069 Unspecified abnormalities of breathing: Secondary | ICD-10-CM | POA: Diagnosis not present

## 2020-01-22 DIAGNOSIS — I2510 Atherosclerotic heart disease of native coronary artery without angina pectoris: Secondary | ICD-10-CM | POA: Diagnosis present

## 2020-01-22 DIAGNOSIS — K5900 Constipation, unspecified: Secondary | ICD-10-CM | POA: Diagnosis present

## 2020-01-22 DIAGNOSIS — N179 Acute kidney failure, unspecified: Secondary | ICD-10-CM

## 2020-01-22 DIAGNOSIS — R0602 Shortness of breath: Secondary | ICD-10-CM

## 2020-01-22 DIAGNOSIS — A4189 Other specified sepsis: Principal | ICD-10-CM | POA: Diagnosis present

## 2020-01-22 DIAGNOSIS — A419 Sepsis, unspecified organism: Secondary | ICD-10-CM | POA: Diagnosis present

## 2020-01-22 DIAGNOSIS — F419 Anxiety disorder, unspecified: Secondary | ICD-10-CM | POA: Diagnosis present

## 2020-01-22 DIAGNOSIS — J189 Pneumonia, unspecified organism: Secondary | ICD-10-CM | POA: Diagnosis present

## 2020-01-22 DIAGNOSIS — N183 Chronic kidney disease, stage 3 unspecified: Secondary | ICD-10-CM | POA: Diagnosis present

## 2020-01-22 DIAGNOSIS — I9589 Other hypotension: Secondary | ICD-10-CM

## 2020-01-22 DIAGNOSIS — I959 Hypotension, unspecified: Secondary | ICD-10-CM | POA: Diagnosis not present

## 2020-01-22 DIAGNOSIS — Z79899 Other long term (current) drug therapy: Secondary | ICD-10-CM

## 2020-01-22 DIAGNOSIS — J1282 Pneumonia due to coronavirus disease 2019: Secondary | ICD-10-CM | POA: Diagnosis present

## 2020-01-22 DIAGNOSIS — R7989 Other specified abnormal findings of blood chemistry: Secondary | ICD-10-CM | POA: Diagnosis not present

## 2020-01-22 DIAGNOSIS — J9690 Respiratory failure, unspecified, unspecified whether with hypoxia or hypercapnia: Secondary | ICD-10-CM | POA: Diagnosis not present

## 2020-01-22 DIAGNOSIS — Z87891 Personal history of nicotine dependence: Secondary | ICD-10-CM | POA: Diagnosis not present

## 2020-01-22 DIAGNOSIS — Z85828 Personal history of other malignant neoplasm of skin: Secondary | ICD-10-CM

## 2020-01-22 DIAGNOSIS — I10 Essential (primary) hypertension: Secondary | ICD-10-CM | POA: Diagnosis present

## 2020-01-22 DIAGNOSIS — E860 Dehydration: Secondary | ICD-10-CM | POA: Diagnosis present

## 2020-01-22 DIAGNOSIS — R0902 Hypoxemia: Secondary | ICD-10-CM | POA: Diagnosis not present

## 2020-01-22 DIAGNOSIS — J9601 Acute respiratory failure with hypoxia: Secondary | ICD-10-CM | POA: Diagnosis present

## 2020-01-22 DIAGNOSIS — R509 Fever, unspecified: Secondary | ICD-10-CM | POA: Diagnosis not present

## 2020-01-22 DIAGNOSIS — Z66 Do not resuscitate: Secondary | ICD-10-CM | POA: Diagnosis present

## 2020-01-22 DIAGNOSIS — R778 Other specified abnormalities of plasma proteins: Secondary | ICD-10-CM | POA: Diagnosis present

## 2020-01-22 DIAGNOSIS — E861 Hypovolemia: Secondary | ICD-10-CM

## 2020-01-22 DIAGNOSIS — U071 COVID-19: Secondary | ICD-10-CM | POA: Diagnosis present

## 2020-01-22 LAB — COMPREHENSIVE METABOLIC PANEL
ALT: 39 U/L (ref 0–44)
AST: 59 U/L — ABNORMAL HIGH (ref 15–41)
Albumin: 3 g/dL — ABNORMAL LOW (ref 3.5–5.0)
Alkaline Phosphatase: 61 U/L (ref 38–126)
Anion gap: 10 (ref 5–15)
BUN: 41 mg/dL — ABNORMAL HIGH (ref 8–23)
CO2: 19 mmol/L — ABNORMAL LOW (ref 22–32)
Calcium: 8.1 mg/dL — ABNORMAL LOW (ref 8.9–10.3)
Chloride: 108 mmol/L (ref 98–111)
Creatinine, Ser: 1.57 mg/dL — ABNORMAL HIGH (ref 0.61–1.24)
GFR calc Af Amer: 48 mL/min — ABNORMAL LOW (ref >60)
GFR calc non Af Amer: 42 mL/min — ABNORMAL LOW (ref >60)
Glucose, Bld: 117 mg/dL — ABNORMAL HIGH (ref 70–99)
Potassium: 4.1 mmol/L (ref 3.5–5.1)
Sodium: 137 mmol/L (ref 135–145)
Total Bilirubin: 1 mg/dL (ref 0.3–1.2)
Total Protein: 6 g/dL — ABNORMAL LOW (ref 6.5–8.1)

## 2020-01-22 LAB — CBC WITH DIFFERENTIAL/PLATELET
Abs Immature Granulocytes: 0.16 10*3/uL — ABNORMAL HIGH (ref 0.00–0.07)
Basophils Absolute: 0 10*3/uL (ref 0.0–0.1)
Basophils Relative: 0 %
Eosinophils Absolute: 0 10*3/uL (ref 0.0–0.5)
Eosinophils Relative: 0 %
HCT: 41.4 % (ref 39.0–52.0)
Hemoglobin: 14 g/dL (ref 13.0–17.0)
Immature Granulocytes: 1 %
Lymphocytes Relative: 4 %
Lymphs Abs: 0.5 10*3/uL — ABNORMAL LOW (ref 0.7–4.0)
MCH: 30.8 pg (ref 26.0–34.0)
MCHC: 33.8 g/dL (ref 30.0–36.0)
MCV: 91.2 fL (ref 80.0–100.0)
Monocytes Absolute: 1 10*3/uL (ref 0.1–1.0)
Monocytes Relative: 7 %
Neutro Abs: 12.9 10*3/uL — ABNORMAL HIGH (ref 1.7–7.7)
Neutrophils Relative %: 88 %
Platelets: 210 10*3/uL (ref 150–400)
RBC: 4.54 MIL/uL (ref 4.22–5.81)
RDW: 13.2 % (ref 11.5–15.5)
WBC: 14.6 10*3/uL — ABNORMAL HIGH (ref 4.0–10.5)
nRBC: 0 % (ref 0.0–0.2)

## 2020-01-22 LAB — BLOOD GAS, VENOUS
Acid-base deficit: 7 mmol/L — ABNORMAL HIGH (ref 0.0–2.0)
Bicarbonate: 15.6 mmol/L — ABNORMAL LOW (ref 20.0–28.0)
O2 Saturation: 82.6 %
Patient temperature: 37
pCO2, Ven: 24 mmHg — ABNORMAL LOW (ref 44.0–60.0)
pH, Ven: 7.42 (ref 7.250–7.430)
pO2, Ven: 46 mmHg — ABNORMAL HIGH (ref 32.0–45.0)

## 2020-01-22 LAB — LACTIC ACID, PLASMA
Lactic Acid, Venous: 2 mmol/L (ref 0.5–1.9)
Lactic Acid, Venous: 1 mmol/L (ref 0.5–1.9)

## 2020-01-22 LAB — PROCALCITONIN: Procalcitonin: 2.21 ng/mL

## 2020-01-22 LAB — TROPONIN I (HIGH SENSITIVITY)
Troponin I (High Sensitivity): 252 ng/L (ref <18)
Troponin I (High Sensitivity): 238 ng/L (ref <18)

## 2020-01-22 LAB — FERRITIN: Ferritin: 1131 ng/mL — ABNORMAL HIGH (ref 24–336)

## 2020-01-22 LAB — TRIGLYCERIDES: Triglycerides: 80 mg/dL (ref <150)

## 2020-01-22 LAB — FIBRIN DERIVATIVES D-DIMER (ARMC ONLY): Fibrin derivatives D-dimer (ARMC): 4314.19 ng/mL (FEU) — ABNORMAL HIGH (ref 0.00–499.00)

## 2020-01-22 LAB — C-REACTIVE PROTEIN: CRP: 12.4 mg/dL — ABNORMAL HIGH (ref <1.0)

## 2020-01-22 LAB — LACTATE DEHYDROGENASE: LDH: 357 U/L — ABNORMAL HIGH (ref 98–192)

## 2020-01-22 LAB — FIBRINOGEN: Fibrinogen: 683 mg/dL — ABNORMAL HIGH (ref 210–475)

## 2020-01-22 MED ORDER — LACTATED RINGERS IV SOLN: INTRAVENOUS | Status: DC

## 2020-01-22 MED ORDER — SODIUM CHLORIDE 0.9 % IV SOLN
1.0000 g | INTRAVENOUS | Status: DC
  Administered 2020-01-23: 14:00:00 1 g via INTRAVENOUS
  Filled 2020-01-22: qty 10
  Filled 2020-01-22: qty 1

## 2020-01-22 MED ORDER — IPRATROPIUM-ALBUTEROL 20-100 MCG/ACT IN AERS
1.0000 | INHALATION_SPRAY | Freq: Four times a day (QID) | RESPIRATORY_TRACT | Status: DC
  Administered 2020-01-23 (×3): 1 via RESPIRATORY_TRACT
  Filled 2020-01-22: qty 4

## 2020-01-22 MED ORDER — POLYETHYLENE GLYCOL 3350 17 G PO PACK
17.0000 g | PACK | Freq: Every day | ORAL | Status: DC | PRN
  Filled 2020-01-22: qty 1

## 2020-01-22 MED ORDER — SODIUM CHLORIDE 0.9 % IV SOLN
500.0000 mg | INTRAVENOUS | Status: DC
  Administered 2020-01-23: 21:00:00 500 mg via INTRAVENOUS
  Filled 2020-01-22: qty 500

## 2020-01-22 MED ORDER — MOMETASONE FURO-FORMOTEROL FUM 200-5 MCG/ACT IN AERO
2.0000 | INHALATION_SPRAY | Freq: Two times a day (BID) | RESPIRATORY_TRACT | Status: DC
  Administered 2020-01-23 (×3): 2 via RESPIRATORY_TRACT
  Filled 2020-01-22: qty 8.8

## 2020-01-22 MED ORDER — GUAIFENESIN-DM 100-10 MG/5ML PO SYRP
5.0000 mL | ORAL_SOLUTION | ORAL | Status: DC | PRN
  Administered 2020-01-23: 09:00:00 5 mL via ORAL
  Filled 2020-01-22 (×2): qty 5

## 2020-01-22 MED ORDER — SODIUM CHLORIDE 0.9 % IV BOLUS
1000.0000 mL | Freq: Once | INTRAVENOUS | Status: AC
  Administered 2020-01-22: 1000 mL via INTRAVENOUS

## 2020-01-22 MED ORDER — ACETAMINOPHEN 325 MG PO TABS: 650.0000 mg | ORAL_TABLET | Freq: Four times a day (QID) | ORAL | Status: DC | PRN

## 2020-01-22 MED ORDER — GUAIFENESIN ER 600 MG PO TB12
600.0000 mg | ORAL_TABLET | Freq: Two times a day (BID) | ORAL | Status: DC
  Administered 2020-01-22 – 2020-01-23 (×3): 600 mg via ORAL
  Filled 2020-01-22 (×4): qty 1

## 2020-01-22 MED ORDER — TRAZODONE HCL 50 MG PO TABS: 25.0000 mg | ORAL_TABLET | Freq: Every evening | ORAL | Status: DC | PRN

## 2020-01-22 MED ORDER — TRAMADOL HCL 50 MG PO TABS: 50.0000 mg | ORAL_TABLET | Freq: Four times a day (QID) | ORAL | Status: DC | PRN

## 2020-01-22 MED ORDER — SODIUM CHLORIDE 0.9 % IV SOLN
500.0000 mg | Freq: Once | INTRAVENOUS | Status: AC
  Administered 2020-01-22: 500 mg via INTRAVENOUS
  Filled 2020-01-22: qty 500

## 2020-01-22 MED ORDER — MAGNESIUM CITRATE PO SOLN
1.0000 | Freq: Once | ORAL | Status: DC | PRN
  Filled 2020-01-22: qty 296

## 2020-01-22 MED ORDER — ACETAMINOPHEN 650 MG RE SUPP: 650.0000 mg | Freq: Four times a day (QID) | RECTAL | Status: DC | PRN

## 2020-01-22 MED ORDER — DEXAMETHASONE SODIUM PHOSPHATE 10 MG/ML IJ SOLN
6.0000 mg | Freq: Every day | INTRAMUSCULAR | Status: DC
  Administered 2020-01-23: 6 mg via INTRAVENOUS
  Filled 2020-01-22: qty 1

## 2020-01-22 MED ORDER — DEXAMETHASONE SODIUM PHOSPHATE 10 MG/ML IJ SOLN
8.0000 mg | Freq: Once | INTRAMUSCULAR | Status: AC
  Administered 2020-01-22: 8 mg via INTRAVENOUS
  Filled 2020-01-22: qty 1

## 2020-01-22 MED ORDER — ALBUTEROL SULFATE HFA 108 (90 BASE) MCG/ACT IN AERS
2.0000 | INHALATION_SPRAY | RESPIRATORY_TRACT | Status: DC | PRN
  Administered 2020-01-23: 14:00:00 2 via RESPIRATORY_TRACT
  Filled 2020-01-22 (×2): qty 6.7

## 2020-01-22 MED ORDER — ROSUVASTATIN CALCIUM 10 MG PO TABS
10.0000 mg | ORAL_TABLET | Freq: Every day | ORAL | Status: DC
  Administered 2020-01-22 – 2020-01-23 (×2): 10 mg via ORAL
  Filled 2020-01-22 (×3): qty 1

## 2020-01-22 MED ORDER — ENOXAPARIN SODIUM 40 MG/0.4ML ~~LOC~~ SOLN
40.0000 mg | SUBCUTANEOUS | Status: DC
  Filled 2020-01-22 (×2): qty 0.4

## 2020-01-22 MED ORDER — SODIUM CHLORIDE 0.9 % IV SOLN
2.0000 g | Freq: Once | INTRAVENOUS | Status: AC
  Administered 2020-01-22: 17:00:00 2 g via INTRAVENOUS
  Filled 2020-01-22: qty 20

## 2020-01-22 MED ORDER — BISACODYL 5 MG PO TBEC: 5.0000 mg | DELAYED_RELEASE_TABLET | Freq: Every day | ORAL | Status: DC | PRN

## 2020-01-22 NOTE — ED Provider Notes (Signed)
Patient seen by myself and nurse practitioner.  Diagnosed with novel coronavirus 12 days ago, reports his breathing is better now on oxygen overall quite well-appearing significantly hypotensive, suspect dehydration.  IV fluids infusing, will closely monitor.   Lavonia Drafts, MD 01/22/20 [phone]

## 2020-01-22 NOTE — ED Triage Notes (Signed)
Pt bib EMS for worsening SOB and weakness post +covid dx on 1/19. Initial RA 89-91 on EMS arrival, placed on 2L, increased to 92. RA on ED arrival 80. AOx4.

## 2020-01-22 NOTE — ED Provider Notes (Signed)
[facility] Emergency Department Provider Note  ____________________________________________   None    (approximate)  I have reviewed the triage vital signs and the nursing notes.   HISTORY  Chief Complaint Shortness of Breath   HPI Anthony Morrison is a 79 y.o. male presents to the emergency department for treatment and evaluation of shortness of breath.  Patient has known diagnosis of COVID-19.  He states that he just "could not get going this morning."  He states that shortness of breath weakness continuously worsened.  On EMS arrival he had an initial O2 sat of 89 and was then placed on 2 L of oxygen via nasal cannula which has now increased his oxygen saturation to 92%.  Patient denies any chest pain.  EMS also noted that he was febrile.   Past Medical History:  Diagnosis Date  . Arthritis   . Bronchitis   . Cancer ([place])    SKIN CANCERS  . COPD (chronic obstructive pulmonary disease) (HCC)    MILD PER PT  . Pneumonia 2016    Patient Active Problem List   Diagnosis Date Noted  . COVID-19 virus infection 01/22/2020  . CAP (community acquired pneumonia) 01/22/2020  . AKI (acute kidney injury) ([place]) 01/22/2020  . Elevated troponin 01/22/2020  . Sepsis ([place]) 01/22/2020  . Hypotension 01/22/2020  . 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  . Atherosclerosis of native arteries of extremity with intermittent claudication ([place]) 02/28/2019  . Carotid stenosis, asymptomatic, bilateral 02/21/2019  . PAD (peripheral artery disease) ([place]) 02/21/2019  . Bilateral carotid artery stenosis 02/01/2019  . PSVT (paroxysmal supraventricular tachycardia) ([place]) 01/31/2019  . ASCVD (arteriosclerotic cardiovascular disease) 01/25/2019  . S/P CABG x 3 01/14/2019  . Stable angina ([place]) 01/12/2019  . Benign essential HTN 01/10/2019  . Umbilical hernia without obstruction and without gangrene 12/27/2018  . Hyperlipidemia, mixed 03/25/2018  . Medicare annual wellness visit, initial  03/24/2017  . 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 Acute respiratory failure with hypoxia ([place]) 01/13/2017  . B12 deficiency 03/18/2016    Past Surgical History:  Procedure Laterality Date  . COLONOSCOPY  2015  . CORONARY ARTERY BYPASS GRAFT N/A 01/14/2019   Procedure: Emergency Coronary Artery Bypass Grafting (CABG)  times three using left internal mammary artery to LAD, reversed saphenous vein graft to OM and RCA;  Surgeon: Grace Isaac, MD;  Location: [place];  Service: Open Heart Surgery;  Laterality: N/A;  . ENDOVEIN HARVEST OF GREATER SAPHENOUS VEIN Right 01/14/2019   Procedure: ENDOVEIN HARVEST OF GREATER SAPHENOUS VEIN;  Surgeon: Grace Isaac, MD;  Location: [place];  Service: Open Heart Surgery;  Laterality: Right;  . EYE SURGERY     CATARACTS BIL  . LEFT HEART CATH AND CORONARY ANGIOGRAPHY Left 01/14/2019   Procedure: LEFT HEART CATH AND CORONARY ANGIOGRAPHY;  Surgeon: Corey Skains, MD;  Location: [facility];  Service: Cardiovascular;  Laterality: Left;  . TEE WITHOUT CARDIOVERSION N/A 01/14/2019   Procedure: TRANSESOPHAGEAL ECHOCARDIOGRAM (TEE);  Surgeon: Grace Isaac, MD;  Location: [place];  Service: Open Heart Surgery;  Laterality: N/A;    Prior to Admission medications   Medication Sig Start Date End Date Taking? Authorizing Provider  ADVAIR HFA 230-21 MCG/ACT inhaler Inhale 2 puffs into the lungs every 12 (twelve) hours. 01/13/20  Yes [provider]  dexamethasone (DECADRON) 2 MG tablet Take 4 tablets by mouth 2 (two) times daily. 01/12/20  Yes [provider]  doxycycline (VIBRAMYCIN) 100 MG capsule Take 100 mg  by mouth 2 (two) times daily. 01/18/20  Yes [provider]  lisinopril (PRINIVIL,ZESTRIL) 10 MG tablet Take 1 tablet (10 mg total) by mouth daily. Patient taking differently: Take 20 mg by mouth 2 (two) times daily.  01/18/19  Yes Gold, Wayne E, PA-C  metoprolol tartrate (LOPRESSOR) 25 MG tablet Take 1 tablet (25  mg total) by mouth 2 (two) times daily. Patient taking differently: Take 50 mg by mouth 2 (two) times daily.  01/18/19  Yes Gold, Wayne E, PA-C  rosuvastatin (CRESTOR) 10 MG tablet Take 10 mg by mouth at bedtime. 07/13/19  Yes [provider]    Allergies Prednisone and Sulfa antibiotics  History reviewed. No pertinent family history.  Social History Social History   Tobacco Use  . Smoking status: Former Smoker    Packs/day: 1.00    Years: 50.00    Pack years: 50.00    Types: Cigarettes    Quit date: 01/06/2015    Years since quitting: 5.0  . Smokeless tobacco: Never Used  Substance Use Topics  . Alcohol use: Yes    Comment: OCC   . Drug use: Never    Review of Systems  Constitutional: Positive for fever/chills. Eyes: No visual changes. ENT: No sore throat. Cardiovascular: Negative for chest pain.  Negative for pleuritic pain.  Negative for palpitations.  Negative for leg pain. Respiratory: Positive for shortness of breath. Gastrointestinal: No abdominal pain.  Negative for nausea, no vomiting.  No diarrhea.  No constipation. Genitourinary: Negative for dysuria. Musculoskeletal: Negative for back pain.  Skin: Negative for rash, lesion, wound. Neurological: Negative for headaches, focal weakness or numbness. ___________________________________________   PHYSICAL EXAM:  VITAL SIGNS: ED Triage Vitals  Enc Vitals Group     BP 01/22/20 1526 (!) 66/47     Pulse Rate 01/22/20 1526 89     Resp --      Temp 01/22/20 1526 98.5 F (36.9 C)     Temp Source 01/22/20 1526 Oral     SpO2 01/22/20 1526 90 %     Weight 01/22/20 1529 158 lb (71.7 kg)     Height 01/22/20 1529 5\' 6"  (1.676 m)     Head Circumference --      Peak Flow --      Pain Score 01/22/20 1529 0     Pain Loc --      Pain Edu? --      Excl. in [place]? --     Constitutional: Alert and oriented.  Acutely ill appearing and in no acute distress.  Normal mental status. Eyes: Conjunctivae are normal.  PERRL. Head: Atraumatic. Nose: No congestion/rhinnorhea. Mouth/Throat: Mucous membranes are moist.  Oropharynx non-erythematous. Tongue normal in size and color. Neck: No stridor.  No carotid bruit appreciated on exam. Hematological/Lymphatic/Immunilogical: No cervical lymphadenopathy. Cardiovascular: Normal rate, regular rhythm. Grossly normal heart sounds.  Good peripheral circulation. Respiratory: Normal respiratory effort.  No retractions. Lungs CTAB. Gastrointestinal: Soft and nontender. No distention. No abdominal bruits. No CVA tenderness. Genitourinary: Exam deferred. Musculoskeletal: No lower extremity tenderness.  No edema of extremities. Neurologic:  Normal speech and language. No gross focal neurologic deficits are appreciated. Skin:  Skin is warm, dry and intact. No rash noted. Psychiatric: Mood and affect are normal. Speech and behavior are normal.  ____________________________________________   LABS (all labs ordered are listed, but only abnormal results are displayed)  Labs Reviewed  LACTIC ACID, PLASMA - Abnormal; Notable for the following components:      Result Value  Lactic Acid, Venous 2.0 (*)    All other components within normal limits  CBC WITH DIFFERENTIAL/PLATELET - Abnormal; Notable for the following components:   WBC 14.6 (*)    Neutro Abs 12.9 (*)    Lymphs Abs 0.5 (*)    Abs Immature Granulocytes 0.16 (*)    All other components within normal limits  COMPREHENSIVE METABOLIC PANEL - Abnormal; Notable for the following components:   CO2 19 (*)    Glucose, Bld 117 (*)    BUN 41 (*)    Creatinine, Ser 1.57 (*)    Calcium 8.1 (*)    Total Protein 6.0 (*)    Albumin 3.0 (*)    AST 59 (*)    GFR calc non Af Amer 42 (*)    GFR calc Af Amer 48 (*)    All other components within normal limits  FIBRIN DERIVATIVES D-DIMER ([facility] ONLY) - Abnormal; Notable for the following components:   Fibrin derivatives D-dimer ([facility]) 4,314.19 (*)    All other  components within normal limits  LACTATE DEHYDROGENASE - Abnormal; Notable for the following components:   LDH 357 (*)    All other components within normal limits  FERRITIN - Abnormal; Notable for the following components:   Ferritin 1,131 (*)    All other components within normal limits  FIBRINOGEN - Abnormal; Notable for the following components:   Fibrinogen 683 (*)    All other components within normal limits  C-REACTIVE PROTEIN - Abnormal; Notable for the following components:   CRP 12.4 (*)    All other components within normal limits  BLOOD GAS, VENOUS - Abnormal; Notable for the following components:   pCO2, Ven 24 (*)    pO2, Ven 46.0 (*)    Bicarbonate 15.6 (*)    Acid-base deficit 7.0 (*)    All other components within normal limits  TROPONIN I (HIGH SENSITIVITY) - Abnormal; Notable for the following components:   Troponin I (High Sensitivity) 252 (*)    All other components within normal limits  CULTURE, BLOOD (ROUTINE X 2)  CULTURE, BLOOD (ROUTINE X 2)  PROCALCITONIN  TRIGLYCERIDES  LACTIC ACID, PLASMA  LACTIC ACID, PLASMA  CBC  CREATININE, SERUM  BASIC METABOLIC PANEL  C-REACTIVE PROTEIN  TROPONIN I (HIGH SENSITIVITY)   ____________________________________________  EKG  ED ECG REPORT I, Kensleigh Gates, FNP-BC personally viewed and interpreted this ECG.   Date: 01/22/2020  EKG Time: 1551  Rate: 77  Rhythm: normal sinus rhythm  Axis: normal  Intervals:none  ST&T Change: no ST elevation  ____________________________________________  RADIOLOGY  ED MD interpretation:  Bilateral lower lung infiltrates.  I, Sherrie George, personally viewed and evaluated these images (plain radiographs) as part of my medical decision making, as well as reviewing the written report by the radiologist.  Official radiology report(s): DG Chest Port 1 View  Result Date: 01/22/2020 CLINICAL DATA:  Fever. COVID-19 patient. EXAM: PORTABLE CHEST 1 VIEW COMPARISON:  February 14, 2019 FINDINGS: Infiltrates are seen in the lung bases, right greater than left. No pneumothorax. The heart size borderline. The hila and mediastinum are normal. No other abnormalities. IMPRESSION: Bibasilar infiltrates, right greater than left, consistent with COVID-19 pneumonia given history. Electronically Signed   By: Dorise Bullion III M.D   On: 01/22/2020 16:29    ____________________________________________   PROCEDURES  Procedure(s) performed: None  Procedures  Critical Care performed: Yes, see critical care note(s)  ____________________________________________   INITIAL IMPRESSION / ASSESSMENT AND PLAN / ED COURSE  As part of my medical decision making, I reviewed the following data within the [facility] by EM attending Dr. Corky Downs.  79 year old male presenting to the emergency department for treatment and evaluation of shortness of breath in the presence of COVID-19 infection.  See HPI for further details.  Plan will be to complete Covid/sepsis work-up.   Differential diagnosis includes but is not limited to: COVID-19 pneumonia, COVID-19, bacterial pneumonia, pulmonary embolus.  ----------------------------------------- 4:32 PM on 01/22/2020 ----------------------------------------- Pressure improving.  87/57 currently with an oxygen saturation of 92% on 4 liters [place]. He is conversing comfortably.  He is able to speak in complete sentences.  He continues to deny chest pain.  ----------------------------------------- 5:03 PM on 01/22/2020 -----------------------------------------  Continues to be pain-free.  Troponin is elevated at 252 this is most likely from dehydration as the patient has not had any chest pain associated with his shortness of breath.  Lactic acid is 2.0.  2 L of fluid have been ordered and infused.  Plan will be to administer broad-spectrum antibiotics.  Procalcitonin is noted to be 2.21.  D-dimer is elevated over 4000.   Plan at this point will be to get the patient admitted to the hospital.  ----------------------------------------- 5:27 PM on 01/22/2020 -----------------------------------------  Patient admitted through the hospitalist service.  CRITICAL CARE Performed by: Sherrie George   Total critical care time: 30 minutes  Critical care time was exclusive of separately billable procedures and treating other patients.  Critical care was necessary to treat or prevent imminent or life-threatening deterioration.  Critical care was time spent personally by me on the following activities: development of treatment plan with patient and/or surrogate as well as nursing, discussions with consultants, evaluation of patient's response to treatment, examination of patient, obtaining history from patient or surrogate, ordering and performing treatments and interventions, ordering and review of laboratory studies, ordering and review of radiographic studies, pulse oximetry and re-evaluation of patient's condition.  ____________________________________________   FINAL CLINICAL IMPRESSION(S) / ED DIAGNOSES  Final diagnoses:  COVID-19  Dehydration  Acute respiratory failure with hypoxia [facility])    ED Discharge Orders    None       Anthony Morrison was evaluated in Emergency Department on 01/22/2020 for the symptoms described in the history of present illness. He was evaluated in the context of the global COVID-19 pandemic, which necessitated consideration that the patient might be at risk for infection with the SARS-CoV-2 virus that causes COVID-19. Institutional protocols and algorithms that pertain to the evaluation of patients at risk for COVID-19 are in a state of rapid change based on information released by regulatory bodies including the CDC and federal and state organizations. These policies and algorithms were followed during the patient's care in the ED.   Note:  This document was prepared using  Dragon voice recognition software and may include unintentional dictation errors.   Victorino Dike, FNP 01/22/20 1817    Lavonia Drafts, MD 01/22/20 [phone]

## 2020-01-22 NOTE — H&P (Signed)
History and Physical    Anthony Morrison ZCH:[id number] DOB: 05-05-1941 DOA: 01/22/2020  PCP: Rusty Aus, MD   Patient coming from: Home  I have personally briefly reviewed patient's old medical records in [place]  Chief Complaint: Fever and shortness of breath  HPI: Anthony Morrison is a 79 y.o. 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  Of note, patient tested positive for COVID-19 on January 19.  He was treated outpatient with steroids.  Recently primary care doctor started him on antibiotics for presumed pneumonia.  Associated symptoms include poor appetite, generalized weakness, mild cough.  No nausea vomiting or diarrhea, but does report constipation for which he has already taken stool softeners.  States after waking up yesterday he was not unable to take his medications and "deteriorated from there".  States he tried ibuprofen which did not help his fever.  Later took Fioricet and states that eventually helped but it took hours.  Family called EMS and on their arrival patient was satting in the 62s on room air with blood pressure in the 28N systolic.  Patient denies chest pain, dysuria, sore throat, focal weakness, leg pain, confusion.  ED Course: Hypotensive initially 66/47, hypoxic requiring 4 L/min to maintain oxygen sat of 90 to 91%, afebrile.  Labs were notable for leukocytosis 86.7, metabolic acidosis bicarb 19, BUN 41, creatinine 1.57 (from 1.20 in January 2020), CRP 12.4, LDH 357, troponin 252 >> 238 (patient without chest pain), elevated ferritin, D-dimer 4314, procalcitonin initial lactate two-point 0 repeat 1.0.  Chest x-ray showed bibasilar infiltrates right greater than left consistent with COVID-19 pneumonia versus CAP given positive test back on the  19th.  Patient admitted to hospitalist service for further evaluation management.   Review of Systems: As per HPI otherwise 10 point review of systems negative.   Past Medical History:  Diagnosis Date  . Arthritis   . Bronchitis   . Cancer ([place])    SKIN CANCERS  . COPD (chronic obstructive pulmonary disease) (HCC)    MILD PER PT  . Pneumonia 2016    Past Surgical History:  Procedure Laterality Date  . COLONOSCOPY  2015  . CORONARY ARTERY BYPASS GRAFT N/A 01/14/2019   Procedure: Emergency Coronary Artery Bypass Grafting (CABG)  times three using left internal mammary artery to LAD, reversed saphenous vein graft to OM and RCA;  Surgeon: Grace Isaac, MD;  Location: [place] [facility];  Service: Open Heart Surgery;  Laterality: N/A;  . ENDOVEIN HARVEST OF GREATER SAPHENOUS VEIN Right 01/14/2019   Procedure: ENDOVEIN HARVEST OF GREATER SAPHENOUS VEIN;  Surgeon: Grace Isaac, MD;  Location: [place] [facility];  Service: Open Heart Surgery;  Laterality: Right;  . EYE SURGERY     CATARACTS BIL  . LEFT HEART CATH AND CORONARY ANGIOGRAPHY Left 01/14/2019   Procedure: LEFT HEART CATH AND CORONARY ANGIOGRAPHY;  Surgeon: Corey Skains, MD;  Location: [facility];  Service: Cardiovascular;  Laterality: Left;  . TEE WITHOUT CARDIOVERSION N/A 01/14/2019   Procedure: TRANSESOPHAGEAL ECHOCARDIOGRAM (TEE);  Surgeon: Grace Isaac, MD;  Location: [place] [facility];  Service: Open Heart Surgery;  Laterality: N/A;     reports that he quit smoking about 5 years ago. His smoking use included cigarettes. He has a 50.00 pack-year smoking history. He has never used smokeless tobacco.  He reports current alcohol use. He reports that he does not use drugs.  Allergies  Allergen Reactions  . Prednisone Itching  . Sulfa Antibiotics Other (See Comments)    Unknown    History reviewed. No pertinent family history.  Patient states mother had coronary artery disease with stent.   Prior to Admission medications     Medication Sig Start Date End Date Taking? Authorizing Provider  amLODipine (NORVASC) 5 MG tablet Take 5 mg by mouth daily. 08/03/19   [provider]  hydrocortisone (PROCTOZONE-HC) 2.5 % rectal cream Place rectally. 03/16/19   [provider]  lisinopril (PRINIVIL,ZESTRIL) 10 MG tablet Take 1 tablet (10 mg total) by mouth daily. 01/18/19   Gold, Patrick Jupiter E, PA-C  metoprolol tartrate (LOPRESSOR) 25 MG tablet Take 1 tablet (25 mg total) by mouth 2 (two) times daily. Patient taking differently: Take 50 mg by mouth 2 (two) times daily.  01/18/19   Gold, Wayne E, PA-C  rosuvastatin (CRESTOR) 10 MG tablet Take 10 mg by mouth at bedtime. 07/13/19   [provider]    Physical Exam: Vitals:   01/22/20 1730 01/22/20 1800 01/22/20 1830 01/22/20 1845  BP: 96/64 96/65 112/73 100/67  Pulse: 81 78 86 82  Resp: (!) 24 20 (!) 23 17  Temp:      TempSrc:      SpO2: 96% 91% 91% 93%  Weight:      Height:         Constitutional: NAD, calm, comfortable, ill-appearing Eyes: Watery eyes with conjunctival injection ENMT: Mucous membranes are moist. Normal dentition.  Hearing grossly normal Neck: No cervical or supraclavicular lymphadenopathy Respiratory: clear to auscultation bilaterally with good aeration, no wheezing, no crackles, no rhonchi. Normal respiratory effort. No accessory muscle use.  On 4 L/min supplemental oxygen nasal cannula. Cardiovascular: Regular rate and rhythm, no murmurs / rubs / gallops. No extremity edema.  Right-sided carotid bruit, none on the left.  Abdomen: no tenderness or distention.  Musculoskeletal: no clubbing / cyanosis. No joint deformity upper and lower extremities. Normal muscle tone.  Skin: Dry, intact, warm to touch, face flushed Neurologic: CN 2-12 grossly intact.  Normal speech Psychiatric: Normal judgment and insight. Alert and oriented x 3. Normal mood.    Labs on Admission: I have personally reviewed following labs and imaging  studies  CBC: Recent Labs  Lab 01/22/20 1546  WBC 14.6*  NEUTROABS 12.9*  HGB 14.0  HCT 41.4  MCV 91.2  PLT 735   Basic Metabolic Panel: Recent Labs  Lab 01/22/20 1546  NA 137  K 4.1  CL 108  CO2 19*  GLUCOSE 117*  BUN 41*  CREATININE 1.57*  CALCIUM 8.1*   GFR: Estimated Creatinine Clearance: 35 mL/min (A) (by C-G formula based on SCr of 1.57 mg/dL (H)). Liver Function Tests: Recent Labs  Lab 01/22/20 1546  AST 59*  ALT 39  ALKPHOS 61  BILITOT 1.0  PROT 6.0*  ALBUMIN 3.0*   No results for input(s): LIPASE, AMYLASE in the last 168 hours. No results for input(s): AMMONIA in the last 168 hours. Coagulation Profile: No results for input(s): INR, PROTIME in the last 168 hours. Cardiac Enzymes: No results for input(s): CKTOTAL, CKMB, CKMBINDEX, TROPONINI in the last 168 hours. BNP (last 3 results) No results for input(s): PROBNP in the last 8760 hours. HbA1C: No results for input(s): HGBA1C in the last 72 hours. CBG: No results for input(s): GLUCAP in the last 168 hours. Lipid Profile: Recent Labs  01/22/20 1546  TRIG 80   Thyroid Function Tests: No results for input(s): TSH, T4TOTAL, FREET4, T3FREE, THYROIDAB in the last 72 hours. Anemia Panel: Recent Labs    01/22/20 1546  FERRITIN 1,131*   Urine analysis: No results found for: COLORURINE, APPEARANCEUR, LABSPEC, PHURINE, GLUCOSEU, HGBUR, BILIRUBINUR, KETONESUR, PROTEINUR, UROBILINOGEN, NITRITE, LEUKOCYTESUR  Radiological Exams on Admission: DG Chest Port 1 View  Result Date: 01/22/2020 CLINICAL DATA:  Fever. COVID-19 patient. EXAM: PORTABLE CHEST 1 VIEW COMPARISON:  February 14, 2019 FINDINGS: Infiltrates are seen in the lung bases, right greater than left. No pneumothorax. The heart size borderline. The hila and mediastinum are normal. No other abnormalities. IMPRESSION: Bibasilar infiltrates, right greater than left, consistent with COVID-19 pneumonia given history. Electronically Signed   By:  Dorise Bullion III M.D   On: 01/22/2020 16:29    EKG: Independently reviewed.  Normal sinus rhythm 77 bpm, right axis deviation, incomplete right bundle branch block and left anterior fascicular block, QTC 487.  Assessment/Plan Principal Problem:   Acute respiratory failure with hypoxia (HCC) Active Problems:   COVID-19 virus infection   CAP (community acquired pneumonia)   AKI (acute kidney injury) ([place])   Sepsis ([place])   Hypotension   Elevated troponin   Sepsis secondary to CAP versus COVID-19 pneumonia -sepsis present on admission as evidenced by leukocytosis, hypotension, and chest x-ray showing pneumonia.  Patient tested positive for COVID-19 on 01/10/2020.  Appears to have been treated with steroids outpatient.  Received fluids and antibiotics in the ED. --Continue Rocephin and azithromycin for presumed CAP --IV dexamethasone given hypoxia and positive COVID-19 status --will forego remdesivir since his positive test was nearly 2 weeks ago --Combivent every 6 hours --Albuterol every 3 hours as needed --Schedule Mucinex and as needed Robitussin-DM --Lower extremity duplex to rule out DVT --Consider CTA chest if hypoxia not improving or worsens or if he develops sinus tach --Maintain airborne and contact precautions --Monitor inflammatory markers --Acetaminophen as needed fever --Continue LR at 100 cc/h  Acute respiratory failure with hypoxia  -secondary to above.  Requiring 4 L/min on admission. --Supplemental oxygen maintain O2 sat greater than 90%  COPD -not acutely exacerbated.  No wheezing on exam.  Quit smoking 5 years ago. --Substitute Dulera for home Advair  AKI (acute kidney injury)  -no history of CKD.  Creatinine on admission 1.59.  Previously 1.20 in January last year. --Most likely prerenal azotemia in the setting of dehydration and sepsis --IV fluids as above --Monitor renal function and electrolytes --Avoid nephrotoxic agents and renally dose meds as  indicated  Hypotension -improved with IV fluids --Monitor BP and hold antihypertensives --IV fluids as needed to support BP  Elevated troponin -patient without chest pain or acute ischemic changes on EKG.  Suspect this is due to demand ischemia in the setting of acute hypoxia.  Patient underwent CABG 1 year ago.  Coronary artery disease status post CABG January 2020 Without chest pain on admission.  Continue Crestor.  Hold metoprolol due to hypotension.  Essential hypertension -hold antihypertensives for now.   DVT prophylaxis: Lovenox Code Status: DNR/DNI Family Communication: Attempted to call patient's stepson following admission encounter but no answer Disposition Plan: Expect discharge home in 24 to 48 hours pending clinical improvement.  Patient is independent with ambulation, no expected barriers related to therapy needs. Consults called: None Admission status: obs   Ezekiel Slocumb, DO [facility]   If 7PM-7AM, please contact night-coverage [email or web address]  01/22/2020, 6:47 PM

## 2020-01-23 ENCOUNTER — Inpatient Hospital Stay: Payer: PPO

## 2020-01-23 DIAGNOSIS — Z79899 Other long term (current) drug therapy: Secondary | ICD-10-CM | POA: Diagnosis not present

## 2020-01-23 DIAGNOSIS — Z951 Presence of aortocoronary bypass graft: Secondary | ICD-10-CM | POA: Diagnosis not present

## 2020-01-23 DIAGNOSIS — J1282 Pneumonia due to coronavirus disease 2019: Secondary | ICD-10-CM | POA: Diagnosis present

## 2020-01-23 DIAGNOSIS — U071 COVID-19: Secondary | ICD-10-CM | POA: Diagnosis present

## 2020-01-23 DIAGNOSIS — N179 Acute kidney failure, unspecified: Secondary | ICD-10-CM | POA: Diagnosis present

## 2020-01-23 DIAGNOSIS — J9601 Acute respiratory failure with hypoxia: Secondary | ICD-10-CM

## 2020-01-23 DIAGNOSIS — K5900 Constipation, unspecified: Secondary | ICD-10-CM | POA: Diagnosis present

## 2020-01-23 DIAGNOSIS — Z87891 Personal history of nicotine dependence: Secondary | ICD-10-CM | POA: Diagnosis not present

## 2020-01-23 DIAGNOSIS — A4189 Other specified sepsis: Secondary | ICD-10-CM | POA: Diagnosis present

## 2020-01-23 DIAGNOSIS — R0602 Shortness of breath: Secondary | ICD-10-CM | POA: Diagnosis present

## 2020-01-23 DIAGNOSIS — E860 Dehydration: Secondary | ICD-10-CM | POA: Diagnosis present

## 2020-01-23 DIAGNOSIS — Z66 Do not resuscitate: Secondary | ICD-10-CM | POA: Diagnosis present

## 2020-01-23 DIAGNOSIS — I10 Essential (primary) hypertension: Secondary | ICD-10-CM | POA: Diagnosis present

## 2020-01-23 DIAGNOSIS — I2510 Atherosclerotic heart disease of native coronary artery without angina pectoris: Secondary | ICD-10-CM | POA: Diagnosis present

## 2020-01-23 DIAGNOSIS — F419 Anxiety disorder, unspecified: Secondary | ICD-10-CM | POA: Diagnosis present

## 2020-01-23 DIAGNOSIS — Z85828 Personal history of other malignant neoplasm of skin: Secondary | ICD-10-CM | POA: Diagnosis not present

## 2020-01-23 DIAGNOSIS — J440 Chronic obstructive pulmonary disease with acute lower respiratory infection: Secondary | ICD-10-CM | POA: Diagnosis present

## 2020-01-23 LAB — CBC WITH DIFFERENTIAL/PLATELET
Abs Immature Granulocytes: 0.15 10*3/uL — ABNORMAL HIGH (ref 0.00–0.07)
Basophils Absolute: 0 10*3/uL (ref 0.0–0.1)
Basophils Relative: 0 %
Eosinophils Absolute: 0 10*3/uL (ref 0.0–0.5)
Eosinophils Relative: 0 %
HCT: 42.3 % (ref 39.0–52.0)
Hemoglobin: 13.8 g/dL (ref 13.0–17.0)
Immature Granulocytes: 1 %
Lymphocytes Relative: 6 %
Lymphs Abs: 0.8 10*3/uL (ref 0.7–4.0)
MCH: 30.7 pg (ref 26.0–34.0)
MCHC: 32.6 g/dL (ref 30.0–36.0)
MCV: 94.2 fL (ref 80.0–100.0)
Monocytes Absolute: 0.7 10*3/uL (ref 0.1–1.0)
Monocytes Relative: 6 %
Neutro Abs: 11.5 10*3/uL — ABNORMAL HIGH (ref 1.7–7.7)
Neutrophils Relative %: 87 %
Platelets: 184 10*3/uL (ref 150–400)
RBC: 4.49 MIL/uL (ref 4.22–5.81)
RDW: 13.3 % (ref 11.5–15.5)
WBC: 13.2 10*3/uL — ABNORMAL HIGH (ref 4.0–10.5)
nRBC: 0 % (ref 0.0–0.2)

## 2020-01-23 LAB — FIBRIN DERIVATIVES D-DIMER (ARMC ONLY): Fibrin derivatives D-dimer (ARMC): 2738.05 ng/mL (FEU) — ABNORMAL HIGH (ref 0.00–499.00)

## 2020-01-23 LAB — COMPREHENSIVE METABOLIC PANEL
ALT: 30 U/L (ref 0–44)
AST: 41 U/L (ref 15–41)
Albumin: 2.9 g/dL — ABNORMAL LOW (ref 3.5–5.0)
Alkaline Phosphatase: 56 U/L (ref 38–126)
Anion gap: 10 (ref 5–15)
BUN: 33 mg/dL — ABNORMAL HIGH (ref 8–23)
CO2: 18 mmol/L — ABNORMAL LOW (ref 22–32)
Calcium: 7.8 mg/dL — ABNORMAL LOW (ref 8.9–10.3)
Chloride: 109 mmol/L (ref 98–111)
Creatinine, Ser: 1.21 mg/dL (ref 0.61–1.24)
GFR calc Af Amer: >60 mL/min (ref >60)
GFR calc non Af Amer: 57 mL/min — ABNORMAL LOW (ref >60)
Glucose, Bld: 88 mg/dL (ref 70–99)
Potassium: 4.2 mmol/L (ref 3.5–5.1)
Sodium: 137 mmol/L (ref 135–145)
Total Bilirubin: 0.7 mg/dL (ref 0.3–1.2)
Total Protein: 6.1 g/dL — ABNORMAL LOW (ref 6.5–8.1)

## 2020-01-23 LAB — C-REACTIVE PROTEIN
CRP: 14.6 mg/dL — ABNORMAL HIGH (ref <1.0)
CRP: 16.9 mg/dL — ABNORMAL HIGH (ref <1.0)

## 2020-01-23 LAB — LACTATE DEHYDROGENASE: LDH: 325 U/L — ABNORMAL HIGH (ref 98–192)

## 2020-01-23 LAB — MAGNESIUM: Magnesium: 2.2 mg/dL (ref 1.7–2.4)

## 2020-01-23 LAB — FERRITIN: Ferritin: 1133 ng/mL — ABNORMAL HIGH (ref 24–336)

## 2020-01-23 MED ORDER — LORAZEPAM 0.5 MG PO TABS: 0.5000 mg | ORAL_TABLET | Freq: Four times a day (QID) | ORAL | Status: DC | PRN

## 2020-01-23 MED ORDER — IPRATROPIUM BROMIDE HFA 17 MCG/ACT IN AERS: 2.0000 | INHALATION_SPRAY | Freq: Four times a day (QID) | RESPIRATORY_TRACT | 12 refills | Status: DC

## 2020-01-23 MED ORDER — DEXAMETHASONE SODIUM PHOSPHATE 10 MG/ML IJ SOLN: 6.0000 mg | Freq: Every day | INTRAMUSCULAR | 0 refills | Status: DC

## 2020-01-23 MED ORDER — SODIUM CHLORIDE 0.9 % IV SOLN: 100.0000 mg | Freq: Every day | INTRAVENOUS | Status: DC

## 2020-01-23 MED ORDER — SODIUM CHLORIDE 0.9 % IV SOLN
200.0000 mg | Freq: Once | INTRAVENOUS | Status: AC
  Administered 2020-01-23: 200 mg via INTRAVENOUS
  Filled 2020-01-23: qty 200

## 2020-01-23 MED ORDER — LEVALBUTEROL TARTRATE 45 MCG/ACT IN AERO: 2.0000 | INHALATION_SPRAY | Freq: Four times a day (QID) | RESPIRATORY_TRACT | 12 refills | Status: DC

## 2020-01-23 MED ORDER — SODIUM CHLORIDE 0.9 % IV SOLN: 500.0000 mg | INTRAVENOUS | Status: DC

## 2020-01-23 MED ORDER — METOPROLOL TARTRATE 25 MG PO TABS
25.0000 mg | ORAL_TABLET | Freq: Two times a day (BID) | ORAL | Status: DC
  Administered 2020-01-23 (×2): 25 mg via ORAL
  Filled 2020-01-23 (×2): qty 1

## 2020-01-23 MED ORDER — LORAZEPAM 2 MG/ML IJ SOLN
0.5000 mg | Freq: Once | INTRAMUSCULAR | Status: AC
  Administered 2020-01-23: 09:00:00 0.5 mg via INTRAVENOUS
  Filled 2020-01-23: qty 1

## 2020-01-23 MED ORDER — IPRATROPIUM BROMIDE HFA 17 MCG/ACT IN AERS
2.0000 | INHALATION_SPRAY | Freq: Four times a day (QID) | RESPIRATORY_TRACT | Status: DC
  Administered 2020-01-23 (×2): 2 via RESPIRATORY_TRACT
  Filled 2020-01-23 (×2): qty 12.9

## 2020-01-23 MED ORDER — GUAIFENESIN ER 600 MG PO TB12: 600.0000 mg | ORAL_TABLET | Freq: Two times a day (BID) | ORAL | Status: DC

## 2020-01-23 MED ORDER — SODIUM CHLORIDE 0.9 % IV SOLN: 1.0000 g | INTRAVENOUS | Status: DC

## 2020-01-23 MED ORDER — METOPROLOL TARTRATE 5 MG/5ML IV SOLN
5.0000 mg | Freq: Once | INTRAVENOUS | Status: AC
  Administered 2020-01-23: 3 mg via INTRAVENOUS
  Filled 2020-01-23: qty 5

## 2020-01-23 MED ORDER — LEVALBUTEROL TARTRATE 45 MCG/ACT IN AERO
2.0000 | INHALATION_SPRAY | Freq: Four times a day (QID) | RESPIRATORY_TRACT | Status: DC
  Administered 2020-01-23 – 2020-01-24 (×2): 2 via RESPIRATORY_TRACT
  Filled 2020-01-23 (×2): qty 15

## 2020-01-23 NOTE — Progress Notes (Signed)
Remdesivir - Pharmacy Brief Note   Patient COVID+ January 19, results available in Care Everywhere.  O:  ALT: 30 CXR: Persistent multifocal pneumonia is worse on the right and shows progression on the left SpO2: 91% on 10L HFNC   A/P:  Remdesivir 200 mg IVPB once followed by 100 mg IVPB daily x 4 days.   Dorena Bodo, PharmD 01/23/2020 4:06 PM

## 2020-01-23 NOTE — Progress Notes (Signed)
Pt started the shift on 4L [place] and throughout the day had more SOB, tachycardia, tachypnea and elevated blood pressure increasing. Dr. Posey Pronto notified and patient educated VO:[id number] spirometer, flutter valve and the need to go to [facility] if he doesn't improve. At first patient was hesitant and then he agreed once he spoke with his PCP. Patient now stable, Sp02 low 90's.;

## 2020-01-23 NOTE — Discharge Summary (Signed)
[facility] Discharge Summary   Patient: Anthony Morrison EUM:[id number]  PCP: Rusty Aus, MD  Date of admission: 01/22/2020   Date of discharge:  01/23/2020     Discharge Diagnoses:  Principal Problem:   Acute respiratory failure with hypoxia ([place]) Active Problems:   COVID-19 virus infection   CAP (community acquired pneumonia)   AKI (acute kidney injury) ([place])   Elevated troponin   Sepsis ([place])   Hypotension   Acute hypoxemic respiratory failure due to COVID-19 [facility])   Admitted From: home Disposition:  Outside Hospital transfer to [facility]  Recommendations for Outpatient Follow-up:  1. PCP: none 2. Follow up LABS/TEST:  none  Follow-up Information    Rusty Aus, MD. Schedule an appointment as soon as possible for a visit in 1 week(s).   Specialty: Internal Medicine Contact information: [place] [address] [phone]          Diet recommendation: Cardiac diet  Activity: The patient is advised to gradually reintroduce usual activities, as tolerated  Discharge Condition: stable  Code Status: partial code DNI   History of present illness: As per the H and P dictated on admission, " Anthony Morrison is a 79 y.o. 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  Of note, patient tested positive for COVID-19 on January 19.  He was treated outpatient with steroids.  Recently primary care doctor started him on antibiotics for presumed pneumonia.  Associated symptoms include poor appetite, generalized weakness, mild cough.  No nausea vomiting or diarrhea, but does report constipation for which he has already taken stool softeners.  States after waking up yesterday he was not unable to take his medications  and "deteriorated from there".  States he tried ibuprofen which did not help his fever.  Later took Fioricet and states that eventually helped but it took hours.  Family called EMS and on their arrival patient was satting in the 20s on room air with blood pressure in the 67Y systolic.  Patient denies chest pain, dysuria, sore throat, focal weakness, leg pain, confusion.  ED Course: Hypotensive initially 66/47, hypoxic requiring 4 L/min to maintain oxygen sat of 90 to 91%, afebrile.  Labs were notable for leukocytosis 19.5, metabolic acidosis bicarb 19, BUN 41, creatinine 1.57 (from 1.20 in January 2020), CRP 12.4, LDH 357, troponin 252 >> 238 (patient without chest pain), elevated ferritin, D-dimer 4314, procalcitonin initial lactate two-point 0 repeat 1.0.  Chest x-ray showed bibasilar infiltrates right greater than left consistent with COVID-19 pneumonia versus CAP given positive test back on the 19th.  Patient admitted to hospitalist service for further evaluation management."  Hospital Course:  Summary of his active problems in the hospital is as following. Sepsis secondary to CAP versus COVID-19 pneumonia CAP along with covid -sepsis present on admission as evidenced by leukocytosis, hypotension, and chest x-ray showing pneumonia.   Patient tested positive for COVID-19 on 01/10/2020.   Appears to have been treated with steroids outpatient.  Received fluids and antibiotics in the ED. --Continue Rocephin and azithromycin for presumed CAP --IV dexamethasone given hypoxia and positive COVID-19 status --will add remdesivir due to worsening respiratory failure. -- xopenex and ipratropium every 6 hours --Schedule Mucinex and as needed Robitussin-DM --Lower extremity duplex ruled out DVT, since the pt does not  have any chest pain holding off on CTPE.  --Maintain airborne and contact precautions --Monitor inflammatory markers --Acetaminophen as needed fever  Acute respiratory failure with hypoxia   -secondary to above.  Requiring 4 L/min on admission. Now on 10 LPM --Supplemental oxygen maintain O2 sat greater than 90%  COPD -not acutely exacerbated.  No wheezing on exam.  Quit smoking 5 years ago. --Substitute Dulera for home Advair  AKI (acute kidney injury)  -no history of CKD.  Creatinine on admission 1.59.  Previously 1.20 in January last year. --Most likely prerenal azotemia in the setting of dehydration and sepsis --IV fluids now on hold --Monitor renal function and electrolytes --Avoid nephrotoxic agents and renally dose meds as indicated  Hypotension -improved with IV fluids --Monitor BP and hold antihypertensives --IV fluids as needed to support BP  Elevated troponin -patient without chest pain or acute ischemic changes on EKG.  Suspect this is due to demand ischemia in the setting of acute hypoxia.  Patient underwent CABG 1 year ago.  Coronary artery disease status post CABG January 2020 Without chest pain on admission.  Continue Crestor. Resume metoprolol due to hypotension.  Essential hypertension  Was initial hypotensive  Now blood pressure is rather high Resume lopressor  Anxiety  Added ativan  H/o hemorrhoids Pt has been refusing aspirin and lovenox due to h/o bleeding hemorrhoids.  Explained to him that pathophysiology of covid and increased risk of clot/thrombosis.    On the day of the discharge the patient's vitals were stable, and no other acute medical condition were reported by patient. the patient was felt safe to be discharge at [facility] with no therapy needed on discharge.  Consultants: none Procedures: none  Discharge Exam: General: Appear in moderate distress, no Rash; Oral Mucosa Clear, dry. Cardiovascular: S1 and S2 Present, no Murmur, Respiratory: increased respiratory effort, Bilateral Air entry present and bilateral  Crackles, no wheezes Abdomen: Bowel Sound present, Soft and no tenderness, no hernia Extremities: no  Pedal edema, no calf tenderness Neurology: alert and oriented to time, place, and person affect appropriate.  Filed Weights   01/22/20 1529  Weight: 71.7 kg   Vitals:   01/23/20 1732 01/23/20 1800  BP: 137/64 114/62  Pulse: 86 80  Resp: (!) 22 (!) 31  Temp: 98.1 F (36.7 C)   SpO2: 92% 97%    DISCHARGE MEDICATION: Allergies as of 01/23/2020      Reactions   Prednisone Itching   Sulfa Antibiotics Other (See Comments)   Unknown      Medication List    STOP taking these medications   dexamethasone 2 MG tablet Commonly known as: DECADRON   doxycycline 100 MG capsule Commonly known as: VIBRAMYCIN   lisinopril 10 MG tablet Commonly known as: ZESTRIL     TAKE these medications   Advair HFA 230-21 MCG/ACT inhaler Generic drug: fluticasone-salmeterol Inhale 2 puffs into the lungs every 12 (twelve) hours.   azithromycin 500 mg in sodium chloride 0.9 % 250 mL Inject 500 mg into the vein daily.   cefTRIAXone 1 g in sodium chloride 0.9 % 100 mL Inject 1 g into the vein daily. Start taking on: January 24, 2020   dexamethasone 10 MG/ML injection Commonly known as: DECADRON Inject 0.6 mLs (6 mg total) into the vein daily. Start taking on: January 24, 2020   guaiFENesin 600 MG 12 hr tablet Commonly known as: MUCINEX Take 1 tablet (600 mg total) by mouth 2 (two) times daily.   ipratropium 17 MCG/ACT  inhaler Commonly known as: ATROVENT HFA Inhale 2 puffs into the lungs every 6 (six) hours.   levalbuterol 45 MCG/ACT inhaler Commonly known as: XOPENEX HFA Inhale 2 puffs into the lungs every 6 (six) hours. Start taking on: January 24, 2020   metoprolol tartrate 25 MG tablet Commonly known as: LOPRESSOR Take 1 tablet (25 mg total) by mouth 2 (two) times daily. What changed: how much to take   rosuvastatin 10 MG tablet Commonly known as: CRESTOR Take 10 mg by mouth at bedtime.      Allergies  Allergen Reactions  . Prednisone Itching  . Sulfa Antibiotics Other  (See Comments)    Unknown   Discharge Instructions    Diet - low sodium heart healthy   Complete by: As directed    Increase activity slowly   Complete by: As directed       The results of significant diagnostics from this hospitalization (including imaging, microbiology, ancillary and laboratory) are listed below for reference.    Significant Diagnostic Studies: US Venous Img Lower Bilateral (DVT)  Result Date: 01/22/2020 CLINICAL DATA:  79 year old male COVID-19. Fever and respiratory failure. Abnormal D-dimer. History of right leg greater saphenous vein harvest for CABG. EXAM: BILATERAL LOWER EXTREMITY VENOUS DOPPLER ULTRASOUND TECHNIQUE: Gray-scale sonography with graded compression, as well as color Doppler and duplex ultrasound were performed to evaluate the lower extremity deep venous systems from the level of the common femoral vein and including the common femoral, femoral, profunda femoral, popliteal and calf veins including the posterior tibial, peroneal and gastrocnemius veins when visible. The superficial great saphenous vein was also interrogated. Spectral Doppler was utilized to evaluate flow at rest and with distal augmentation maneuvers in the common femoral, femoral and popliteal veins. COMPARISON:  CT Abdomen and Pelvis 02/26/2011. FINDINGS: RIGHT LOWER EXTREMITY Common Femoral Vein: No evidence of thrombus. Normal compressibility, respiratory phasicity and response to augmentation. Saphenofemoral Junction: No evidence of thrombus. Normal compressibility and flow on color Doppler imaging. Profunda Femoral Vein: No evidence of thrombus. Normal compressibility and flow on color Doppler imaging. Femoral Vein: No evidence of thrombus. Normal compressibility, respiratory phasicity and response to augmentation. Popliteal Vein: No evidence of thrombus. Normal compressibility, respiratory phasicity and response to augmentation. Calf Veins: No evidence of thrombus. Normal compressibility  and flow on color Doppler imaging. Other Findings:  None. LEFT LOWER EXTREMITY Common Femoral Vein: No evidence of thrombus. Normal compressibility, respiratory phasicity and response to augmentation. Saphenofemoral Junction: No evidence of thrombus. Normal compressibility and flow on color Doppler imaging. Profunda Femoral Vein: No evidence of thrombus. Normal compressibility and flow on color Doppler imaging. Femoral Vein: No evidence of thrombus. Normal compressibility, respiratory phasicity and response to augmentation. Popliteal Vein: No evidence of thrombus. Normal compressibility, respiratory phasicity and response to augmentation. Calf Veins: No evidence of thrombus. Normal compressibility and flow on color Doppler imaging. Visible Superficial Great Saphenous Vein: No evidence of thrombus. Normal compressibility. Other Findings:  None. IMPRESSION: No evidence of deep venous thrombosis in either lower extremity. Electronically Signed   By: Genevie Ann M.D.   On: 01/22/2020 20:21   DG Chest Port 1 View  Result Date: 01/23/2020 CLINICAL DATA:  COVID positive patient with shortness of breath. EXAM: PORTABLE CHEST 1 VIEW COMPARISON:  Single-view of the chest 01/22/2020. PA and lateral chest 02/14/2019. FINDINGS: Right worse than left airspace disease is again seen and has somewhat worsened, particularly in the left mid and lower lung zones. The lungs are emphysematous. Heart size is normal. The  patient is status post CABG. No pneumothorax or pleural effusion. IMPRESSION: Persistent multifocal pneumonia is worse on the right and shows progression on the left. Emphysema. Electronically Signed   By: Inge Rise M.D.   On: 01/23/2020 11:08   DG Chest Port 1 View  Result Date: 01/22/2020 CLINICAL DATA:  Fever. COVID-19 patient. EXAM: PORTABLE CHEST 1 VIEW COMPARISON:  February 14, 2019 FINDINGS: Infiltrates are seen in the lung bases, right greater than left. No pneumothorax. The heart size borderline. The  hila and mediastinum are normal. No other abnormalities. IMPRESSION: Bibasilar infiltrates, right greater than left, consistent with COVID-19 pneumonia given history. Electronically Signed   By: Dorise Bullion III M.D   On: 01/22/2020 16:29    Microbiology: Recent Results (from the past 240 hour(s))  Blood Culture (routine x 2)     Status: None (Preliminary result)   Collection Time: 01/22/20  3:49 PM   Specimen: BLOOD  Result Value Ref Range Status   Specimen Description BLOOD R FA  Final   Special Requests   Final    BOTTLES DRAWN AEROBIC AND ANAEROBIC Blood Culture adequate volume   Culture   Final    NO GROWTH < 24 HOURS Performed at [facility], [address] [place], [place] [address]    Report Status PENDING  Incomplete  Blood Culture (routine x 2)     Status: None (Preliminary result)   Collection Time: 01/22/20  3:55 PM   Specimen: BLOOD  Result Value Ref Range Status   Specimen Description BLOOD LAC  Final   Special Requests   Final    BOTTLES DRAWN AEROBIC AND ANAEROBIC Blood Culture results may not be optimal due to an excessive volume of blood received in culture bottles   Culture   Final    NO GROWTH < 24 HOURS Performed at [facility], [address] [place], [place] [address]    Report Status PENDING  Incomplete     Labs: CBC: Recent Labs  Lab 01/22/20 1546 01/23/20 0632  WBC 14.6* 13.2*  NEUTROABS 12.9* 11.5*  HGB 14.0 13.8  HCT 41.4 42.3  MCV 91.2 94.2  PLT 210 233   Basic Metabolic Panel: Recent Labs  Lab 01/22/20 1546 01/23/20 0632  NA 137 137  K 4.1 4.2  CL 108 109  CO2 19* 18*  GLUCOSE 117* 88  BUN 41* 33*  CREATININE 1.57* 1.21  CALCIUM 8.1* 7.8*  MG  --  2.2   Liver Function Tests: Recent Labs  Lab 01/22/20 1546 01/23/20 0632  AST 59* 41  ALT 39 30  ALKPHOS 61 56  BILITOT 1.0 0.7  PROT 6.0* 6.1*  ALBUMIN 3.0* 2.9*   No results for input(s): LIPASE, AMYLASE in the last 168 hours. No results for  input(s): AMMONIA in the last 168 hours. Cardiac Enzymes: No results for input(s): CKTOTAL, CKMB, CKMBINDEX, TROPONINI in the last 168 hours. BNP (last 3 results) No results for input(s): BNP in the last 8760 hours. CBG: No results for input(s): GLUCAP in the last 168 hours.  Time spent: 35 minutes  Signed:  Berle Mull  [facility]  01/23/2020 6:23 PM

## 2020-01-23 NOTE — Plan of Care (Signed)
Updated son with Rm # [phone] at [facility] and also called report to [place], [place] at [phone].  Pt satting mid 90's on non-rebreather mask.  If pt takes mask off to talk on the phone or blow his nose, sats drop to high 70's.  He's been strongly encouraged to keep mask on.  Pt has no c/o pain, other VS stable.  [facility] to transport. Pt's family wants to see him as he goes out - couldn't make any promises, but will call when [facility] gives me 30 min out notice.

## 2020-01-24 ENCOUNTER — Encounter (HOSPITAL_COMMUNITY): Payer: Self-pay | Admitting: Family Medicine

## 2020-01-24 ENCOUNTER — Inpatient Hospital Stay (HOSPITAL_COMMUNITY)
Admission: AD | Admit: 2020-01-24 | Discharge: 2020-02-03 | DRG: 871 | Disposition: A | Discharge status: Skilled Nursing Facility | Payer: PPO | Source: Other Acute Inpatient Hospital | Attending: Internal Medicine | Admitting: Internal Medicine

## 2020-01-24 ENCOUNTER — Other Ambulatory Visit: Payer: Self-pay

## 2020-01-24 DIAGNOSIS — Z7952 Long term (current) use of systemic steroids: Secondary | ICD-10-CM

## 2020-01-24 DIAGNOSIS — I82442 Acute embolism and thrombosis of left tibial vein: Secondary | ICD-10-CM | POA: Diagnosis not present

## 2020-01-24 DIAGNOSIS — E782 Mixed hyperlipidemia: Secondary | ICD-10-CM | POA: Diagnosis not present

## 2020-01-24 DIAGNOSIS — A419 Sepsis, unspecified organism: Secondary | ICD-10-CM | POA: Diagnosis present

## 2020-01-24 DIAGNOSIS — J431 Panlobular emphysema: Secondary | ICD-10-CM | POA: Diagnosis not present

## 2020-01-24 DIAGNOSIS — M1990 Unspecified osteoarthritis, unspecified site: Secondary | ICD-10-CM | POA: Diagnosis not present

## 2020-01-24 DIAGNOSIS — G5620 Lesion of ulnar nerve, unspecified upper limb: Secondary | ICD-10-CM | POA: Diagnosis present

## 2020-01-24 DIAGNOSIS — Z85828 Personal history of other malignant neoplasm of skin: Secondary | ICD-10-CM | POA: Diagnosis not present

## 2020-01-24 DIAGNOSIS — B370 Candidal stomatitis: Secondary | ICD-10-CM | POA: Diagnosis not present

## 2020-01-24 DIAGNOSIS — Z882 Allergy status to sulfonamides status: Secondary | ICD-10-CM

## 2020-01-24 DIAGNOSIS — Z7951 Long term (current) use of inhaled steroids: Secondary | ICD-10-CM

## 2020-01-24 DIAGNOSIS — I70219 Atherosclerosis of native arteries of extremities with intermittent claudication, unspecified extremity: Secondary | ICD-10-CM | POA: Diagnosis not present

## 2020-01-24 DIAGNOSIS — J1282 Pneumonia due to coronavirus disease 2019: Secondary | ICD-10-CM | POA: Diagnosis present

## 2020-01-24 DIAGNOSIS — J9600 Acute respiratory failure, unspecified whether with hypoxia or hypercapnia: Secondary | ICD-10-CM | POA: Diagnosis not present

## 2020-01-24 DIAGNOSIS — I2510 Atherosclerotic heart disease of native coronary artery without angina pectoris: Secondary | ICD-10-CM | POA: Diagnosis not present

## 2020-01-24 DIAGNOSIS — Z79899 Other long term (current) drug therapy: Secondary | ICD-10-CM

## 2020-01-24 DIAGNOSIS — J189 Pneumonia, unspecified organism: Secondary | ICD-10-CM

## 2020-01-24 DIAGNOSIS — I82443 Acute embolism and thrombosis of tibial vein, bilateral: Secondary | ICD-10-CM | POA: Diagnosis present

## 2020-01-24 DIAGNOSIS — N182 Chronic kidney disease, stage 2 (mild): Secondary | ICD-10-CM | POA: Diagnosis not present

## 2020-01-24 DIAGNOSIS — R778 Other specified abnormalities of plasma proteins: Secondary | ICD-10-CM

## 2020-01-24 DIAGNOSIS — E872 Acidosis: Secondary | ICD-10-CM | POA: Diagnosis present

## 2020-01-24 DIAGNOSIS — Z888 Allergy status to other drugs, medicaments and biological substances status: Secondary | ICD-10-CM

## 2020-01-24 DIAGNOSIS — N179 Acute kidney failure, unspecified: Secondary | ICD-10-CM | POA: Diagnosis present

## 2020-01-24 DIAGNOSIS — J449 Chronic obstructive pulmonary disease, unspecified: Secondary | ICD-10-CM | POA: Diagnosis not present

## 2020-01-24 DIAGNOSIS — I129 Hypertensive chronic kidney disease with stage 1 through stage 4 chronic kidney disease, or unspecified chronic kidney disease: Secondary | ICD-10-CM | POA: Diagnosis present

## 2020-01-24 DIAGNOSIS — I82409 Acute embolism and thrombosis of unspecified deep veins of unspecified lower extremity: Secondary | ICD-10-CM | POA: Diagnosis not present

## 2020-01-24 DIAGNOSIS — A4189 Other specified sepsis: Principal | ICD-10-CM | POA: Diagnosis present

## 2020-01-24 DIAGNOSIS — E43 Unspecified severe protein-calorie malnutrition: Secondary | ICD-10-CM | POA: Diagnosis not present

## 2020-01-24 DIAGNOSIS — G5622 Lesion of ulnar nerve, left upper limb: Secondary | ICD-10-CM | POA: Diagnosis present

## 2020-01-24 DIAGNOSIS — Z951 Presence of aortocoronary bypass graft: Secondary | ICD-10-CM

## 2020-01-24 DIAGNOSIS — I82452 Acute embolism and thrombosis of left peroneal vein: Secondary | ICD-10-CM | POA: Diagnosis not present

## 2020-01-24 DIAGNOSIS — R29818 Other symptoms and signs involving the nervous system: Secondary | ICD-10-CM | POA: Diagnosis not present

## 2020-01-24 DIAGNOSIS — N170 Acute kidney failure with tubular necrosis: Secondary | ICD-10-CM | POA: Diagnosis not present

## 2020-01-24 DIAGNOSIS — J9601 Acute respiratory failure with hypoxia: Secondary | ICD-10-CM | POA: Diagnosis not present

## 2020-01-24 DIAGNOSIS — R0602 Shortness of breath: Secondary | ICD-10-CM | POA: Diagnosis present

## 2020-01-24 DIAGNOSIS — I452 Bifascicular block: Secondary | ICD-10-CM | POA: Diagnosis present

## 2020-01-24 DIAGNOSIS — K922 Gastrointestinal hemorrhage, unspecified: Secondary | ICD-10-CM | POA: Diagnosis not present

## 2020-01-24 DIAGNOSIS — Z8701 Personal history of pneumonia (recurrent): Secondary | ICD-10-CM

## 2020-01-24 DIAGNOSIS — E538 Deficiency of other specified B group vitamins: Secondary | ICD-10-CM | POA: Diagnosis not present

## 2020-01-24 DIAGNOSIS — U071 COVID-19: Secondary | ICD-10-CM | POA: Diagnosis present

## 2020-01-24 DIAGNOSIS — N183 Chronic kidney disease, stage 3 unspecified: Secondary | ICD-10-CM | POA: Diagnosis not present

## 2020-01-24 DIAGNOSIS — N1831 Chronic kidney disease, stage 3a: Secondary | ICD-10-CM | POA: Diagnosis not present

## 2020-01-24 DIAGNOSIS — R7989 Other specified abnormal findings of blood chemistry: Secondary | ICD-10-CM | POA: Diagnosis not present

## 2020-01-24 DIAGNOSIS — I739 Peripheral vascular disease, unspecified: Secondary | ICD-10-CM | POA: Diagnosis not present

## 2020-01-24 DIAGNOSIS — M2550 Pain in unspecified joint: Secondary | ICD-10-CM | POA: Diagnosis not present

## 2020-01-24 DIAGNOSIS — I82441 Acute embolism and thrombosis of right tibial vein: Secondary | ICD-10-CM | POA: Diagnosis not present

## 2020-01-24 DIAGNOSIS — I82403 Acute embolism and thrombosis of unspecified deep veins of lower extremity, bilateral: Secondary | ICD-10-CM | POA: Diagnosis not present

## 2020-01-24 DIAGNOSIS — I10 Essential (primary) hypertension: Secondary | ICD-10-CM | POA: Diagnosis present

## 2020-01-24 DIAGNOSIS — I6523 Occlusion and stenosis of bilateral carotid arteries: Secondary | ICD-10-CM | POA: Diagnosis not present

## 2020-01-24 DIAGNOSIS — N171 Acute kidney failure with acute cortical necrosis: Secondary | ICD-10-CM | POA: Diagnosis not present

## 2020-01-24 DIAGNOSIS — Z87891 Personal history of nicotine dependence: Secondary | ICD-10-CM | POA: Diagnosis not present

## 2020-01-24 DIAGNOSIS — Z7401 Bed confinement status: Secondary | ICD-10-CM | POA: Diagnosis not present

## 2020-01-24 DIAGNOSIS — Z9981 Dependence on supplemental oxygen: Secondary | ICD-10-CM | POA: Diagnosis not present

## 2020-01-24 LAB — COMPREHENSIVE METABOLIC PANEL
ALT: 30 U/L (ref 0–44)
AST: 42 U/L — ABNORMAL HIGH (ref 15–41)
Albumin: 2.7 g/dL — ABNORMAL LOW (ref 3.5–5.0)
Alkaline Phosphatase: 54 U/L (ref 38–126)
Anion gap: 9 (ref 5–15)
BUN: 30 mg/dL — ABNORMAL HIGH (ref 8–23)
CO2: 20 mmol/L — ABNORMAL LOW (ref 22–32)
Calcium: 8 mg/dL — ABNORMAL LOW (ref 8.9–10.3)
Chloride: 108 mmol/L (ref 98–111)
Creatinine, Ser: 1.16 mg/dL (ref 0.61–1.24)
GFR calc Af Amer: >60 mL/min (ref >60)
GFR calc non Af Amer: 60 mL/min — ABNORMAL LOW (ref >60)
Glucose, Bld: 87 mg/dL (ref 70–99)
Potassium: 4.4 mmol/L (ref 3.5–5.1)
Sodium: 137 mmol/L (ref 135–145)
Total Bilirubin: 0.7 mg/dL (ref 0.3–1.2)
Total Protein: 6 g/dL — ABNORMAL LOW (ref 6.5–8.1)

## 2020-01-24 LAB — CBC WITH DIFFERENTIAL/PLATELET
Abs Immature Granulocytes: 0.15 10*3/uL — ABNORMAL HIGH (ref 0.00–0.07)
Basophils Absolute: 0 10*3/uL (ref 0.0–0.1)
Basophils Relative: 0 %
Eosinophils Absolute: 0 10*3/uL (ref 0.0–0.5)
Eosinophils Relative: 0 %
HCT: 40.9 % (ref 39.0–52.0)
Hemoglobin: 13.8 g/dL (ref 13.0–17.0)
Immature Granulocytes: 1 %
Lymphocytes Relative: 7 %
Lymphs Abs: 0.9 10*3/uL (ref 0.7–4.0)
MCH: 31.2 pg (ref 26.0–34.0)
MCHC: 33.7 g/dL (ref 30.0–36.0)
MCV: 92.5 fL (ref 80.0–100.0)
Monocytes Absolute: 0.9 10*3/uL (ref 0.1–1.0)
Monocytes Relative: 7 %
Neutro Abs: 10.8 10*3/uL — ABNORMAL HIGH (ref 1.7–7.7)
Neutrophils Relative %: 85 %
Platelets: 197 10*3/uL (ref 150–400)
RBC: 4.42 MIL/uL (ref 4.22–5.81)
RDW: 13.2 % (ref 11.5–15.5)
WBC: 12.7 10*3/uL — ABNORMAL HIGH (ref 4.0–10.5)
nRBC: 0 % (ref 0.0–0.2)

## 2020-01-24 LAB — D-DIMER, QUANTITATIVE: D-Dimer, Quant: 2.86 ug/mL-FEU — ABNORMAL HIGH (ref 0.00–0.50)

## 2020-01-24 LAB — EXPECTORATED SPUTUM ASSESSMENT W GRAM STAIN, RFLX TO RESP C

## 2020-01-24 LAB — MAGNESIUM: Magnesium: 2.1 mg/dL (ref 1.7–2.4)

## 2020-01-24 LAB — FERRITIN: Ferritin: 1082 ng/mL — ABNORMAL HIGH (ref 24–336)

## 2020-01-24 LAB — ABO/RH: ABO/RH(D): O POS

## 2020-01-24 LAB — C-REACTIVE PROTEIN: CRP: 16.7 mg/dL — ABNORMAL HIGH (ref <1.0)

## 2020-01-24 LAB — PROCALCITONIN: Procalcitonin: 0.83 ng/mL

## 2020-01-24 LAB — STREP PNEUMONIAE URINARY ANTIGEN: Strep Pneumo Urinary Antigen: NEGATIVE

## 2020-01-24 MED ORDER — LEVALBUTEROL TARTRATE 45 MCG/ACT IN AERO
2.0000 | INHALATION_SPRAY | Freq: Four times a day (QID) | RESPIRATORY_TRACT | Status: DC | PRN
  Filled 2020-01-24: qty 15

## 2020-01-24 MED ORDER — LORAZEPAM 0.5 MG PO TABS
0.5000 mg | ORAL_TABLET | Freq: Four times a day (QID) | ORAL | Status: DC | PRN
  Administered 2020-01-24 – 2020-01-29 (×4): 0.5 mg via ORAL
  Filled 2020-01-24 (×4): qty 1

## 2020-01-24 MED ORDER — BISACODYL 5 MG PO TBEC
5.0000 mg | DELAYED_RELEASE_TABLET | Freq: Every day | ORAL | Status: DC | PRN
  Filled 2020-01-24: qty 1

## 2020-01-24 MED ORDER — ROSUVASTATIN CALCIUM 5 MG PO TABS
10.0000 mg | ORAL_TABLET | Freq: Every day | ORAL | Status: DC
  Administered 2020-01-24 – 2020-02-02 (×10): 10 mg via ORAL
  Filled 2020-01-24 (×10): qty 2

## 2020-01-24 MED ORDER — METOPROLOL TARTRATE 50 MG PO TABS
50.0000 mg | ORAL_TABLET | Freq: Two times a day (BID) | ORAL | Status: DC
  Administered 2020-01-24 – 2020-02-01 (×16): 50 mg via ORAL
  Filled 2020-01-24 (×15): qty 1

## 2020-01-24 MED ORDER — LISINOPRIL 10 MG PO TABS
10.0000 mg | ORAL_TABLET | Freq: Every day | ORAL | Status: DC
  Administered 2020-01-24 – 2020-02-01 (×9): 10 mg via ORAL
  Filled 2020-01-24 (×11): qty 1

## 2020-01-24 MED ORDER — SODIUM CHLORIDE 0.9% FLUSH
3.0000 mL | INTRAVENOUS | Status: DC | PRN
  Administered 2020-01-30: 3 mL via INTRAVENOUS

## 2020-01-24 MED ORDER — ACETAMINOPHEN 325 MG PO TABS: 650.0000 mg | ORAL_TABLET | Freq: Four times a day (QID) | ORAL | Status: DC | PRN

## 2020-01-24 MED ORDER — SODIUM CHLORIDE 0.9% FLUSH
3.0000 mL | Freq: Two times a day (BID) | INTRAVENOUS | Status: DC
  Administered 2020-01-24 – 2020-02-02 (×20): 3 mL via INTRAVENOUS

## 2020-01-24 MED ORDER — GUAIFENESIN-DM 100-10 MG/5ML PO SYRP
10.0000 mL | ORAL_SOLUTION | ORAL | Status: DC | PRN
  Administered 2020-01-24 – 2020-02-01 (×7): 10 mL via ORAL
  Filled 2020-01-24 (×7): qty 10

## 2020-01-24 MED ORDER — SODIUM CHLORIDE 0.9% FLUSH
3.0000 mL | Freq: Two times a day (BID) | INTRAVENOUS | Status: DC
  Administered 2020-01-24 – 2020-02-02 (×17): 3 mL via INTRAVENOUS

## 2020-01-24 MED ORDER — SODIUM CHLORIDE 0.9 % IV SOLN: 250.0000 mL | INTRAVENOUS | Status: DC | PRN

## 2020-01-24 MED ORDER — SENNOSIDES-DOCUSATE SODIUM 8.6-50 MG PO TABS: 1.0000 | ORAL_TABLET | Freq: Every evening | ORAL | Status: DC | PRN

## 2020-01-24 MED ORDER — IPRATROPIUM BROMIDE HFA 17 MCG/ACT IN AERS
2.0000 | INHALATION_SPRAY | Freq: Four times a day (QID) | RESPIRATORY_TRACT | Status: DC
  Administered 2020-01-24 – 2020-01-25 (×6): 2 via RESPIRATORY_TRACT
  Filled 2020-01-24: qty 12.9

## 2020-01-24 MED ORDER — ONDANSETRON HCL 4 MG/2ML IJ SOLN: 4.0000 mg | Freq: Four times a day (QID) | INTRAMUSCULAR | Status: DC | PRN

## 2020-01-24 MED ORDER — SODIUM CHLORIDE 0.9 % IV SOLN
500.0000 mg | INTRAVENOUS | Status: AC
  Administered 2020-01-24 – 2020-01-26 (×3): 500 mg via INTRAVENOUS
  Filled 2020-01-24 (×3): qty 500

## 2020-01-24 MED ORDER — SODIUM CHLORIDE 0.9 % IV SOLN
100.0000 mg | Freq: Every day | INTRAVENOUS | Status: AC
  Administered 2020-01-24 – 2020-01-27 (×4): 100 mg via INTRAVENOUS
  Filled 2020-01-24 (×4): qty 20

## 2020-01-24 MED ORDER — DEXAMETHASONE SODIUM PHOSPHATE 10 MG/ML IJ SOLN
6.0000 mg | INTRAMUSCULAR | Status: AC
  Administered 2020-01-24 – 2020-02-02 (×10): 6 mg via INTRAVENOUS
  Filled 2020-01-24 (×10): qty 1

## 2020-01-24 MED ORDER — SODIUM CHLORIDE 0.9 % IV SOLN
2.0000 g | INTRAVENOUS | Status: AC
  Administered 2020-01-24 – 2020-01-26 (×3): 2 g via INTRAVENOUS
  Filled 2020-01-24 (×3): qty 20

## 2020-01-24 MED ORDER — ONDANSETRON HCL 4 MG PO TABS: 4.0000 mg | ORAL_TABLET | Freq: Four times a day (QID) | ORAL | Status: DC | PRN

## 2020-01-24 MED ORDER — ENOXAPARIN SODIUM 40 MG/0.4ML ~~LOC~~ SOLN: 40.0000 mg | SUBCUTANEOUS | Status: DC

## 2020-01-24 MED ORDER — MOMETASONE FURO-FORMOTEROL FUM 200-5 MCG/ACT IN AERO
2.0000 | INHALATION_SPRAY | Freq: Two times a day (BID) | RESPIRATORY_TRACT | Status: DC
  Administered 2020-01-24 – 2020-02-01 (×17): 2 via RESPIRATORY_TRACT
  Filled 2020-01-24: qty 8.8

## 2020-01-24 NOTE — H&P (Signed)
History and Physical    Anthony Morrison XBD:[id number] DOB: 1941-07-21 DOA: 01/24/2020  PCP: Rusty Aus, MD   Patient coming from: Home   Chief Complaint: SOB, generalized weakness   HPI: Anthony Morrison is a 79 y.o. male with medical history significant for emphysema, coronary artery disease status post CABG, and chronic kidney disease stage II-III who was diagnosed with COVID-19 on 01/10/2020 (visible in [place]), was started on systemic steroid and antibiotic in the outpatient setting, but continued to worsen and presented to the emergency department on 01/22/2020. He developed general malaise and cough ~1/15 and went on to develop some dyspnea and sputum production, but has not had any chest pain, abdominal pain, or leg swelling or tenderness. He describes increased fatigue and malaise over a couple days prior to his presentation and eventually called EMS who found the patient to be febrile, hypotensive, and hypoxic, started supplemental oxygen, and transported him to [facility] emergency department.  [facility] ED & Hospital Course: Upon arrival to the ED on 01/22/2020, patient was found to be afebrile, saturating 80% on room air, slightly tachypneic, and with blood pressure 66/47.  EKG featured a sinus rhythm with incomplete RBBB and LAFB.  Chest x-ray was concerning for bibasilar infiltrates on a background of emphysematous changes.  Chemistry panel was notable for creatinine of 1.57, up from an apparent baseline of 1.2.  CBC features a leukocytosis to 14,600.  Procalcitonin was elevated to 2.21.  Lactic acid was 2.0.  Troponin was 252 without any anginal complaint.  Patient was given 2 L normal saline in the ED, blood cultures were collected, he was started on empiric antibiotics and also treated with nebs, Decadron, and remdesivir.  Patient was admitted to the hospitalist service, second troponin was downtrending, lactic acid had normalized, blood pressure recovered after the IV fluid bolus, and  bilateral lower extremity venous ultrasound was negative for DVT.  The acute kidney injury resolved and leukocytosis improved slightly, but the patient's supplemental oxygen requirement increased from 4 L/min in the emergency department to 15 L/min.  He was transferred to [facility] for ongoing evaluation and management and arrived in stable condition early morning of 01/24/2020.  Review of Systems:  All other systems reviewed and apart from HPI, are negative.  Past Medical History:  Diagnosis Date  . Arthritis   . Bronchitis   . Cancer ([place])    SKIN CANCERS  . COPD (chronic obstructive pulmonary disease) (HCC)    MILD PER PT  . Pneumonia 2016    Past Surgical History:  Procedure Laterality Date  . COLONOSCOPY  2015  . CORONARY ARTERY BYPASS GRAFT N/A 01/14/2019   Procedure: Emergency Coronary Artery Bypass Grafting (CABG)  times three using left internal mammary artery to LAD, reversed saphenous vein graft to OM and RCA;  Surgeon: Grace Isaac, MD;  Location: [place];  Service: Open Heart Surgery;  Laterality: N/A;  . ENDOVEIN HARVEST OF GREATER SAPHENOUS VEIN Right 01/14/2019   Procedure: ENDOVEIN HARVEST OF GREATER SAPHENOUS VEIN;  Surgeon: Grace Isaac, MD;  Location: [place];  Service: Open Heart Surgery;  Laterality: Right;  . EYE SURGERY     CATARACTS BIL  . LEFT HEART CATH AND CORONARY ANGIOGRAPHY Left 01/14/2019   Procedure: LEFT HEART CATH AND CORONARY ANGIOGRAPHY;  Surgeon: Corey Skains, MD;  Location: [place] CV LAB;  Service: Cardiovascular;  Laterality: Left;  . TEE WITHOUT CARDIOVERSION N/A 01/14/2019   Procedure: TRANSESOPHAGEAL ECHOCARDIOGRAM (TEE);  Surgeon:  Grace Isaac, MD;  Location: [place];  Service: Open Heart Surgery;  Laterality: N/A;     reports that he quit smoking about 5 years ago. His smoking use included cigarettes. He has a 50.00 pack-year smoking history. He has never used smokeless tobacco. He reports current alcohol use. He  reports that he does not use drugs.  Allergies  Allergen Reactions  . Prednisone Itching  . Sulfa Antibiotics Other (See Comments)    Unknown    History reviewed. No pertinent family history.   Prior to Admission medications   Medication Sig Start Date End Date Taking? Authorizing Provider  ADVAIR HFA 230-21 MCG/ACT inhaler Inhale 2 puffs into the lungs every 12 (twelve) hours. 01/13/20   [provider]  azithromycin 500 mg in sodium chloride 0.9 % 250 mL Inject 500 mg into the vein daily. 01/23/20   Lavina Hamman, MD  cefTRIAXone 1 g in sodium chloride 0.9 % 100 mL Inject 1 g into the vein daily. 01/24/20   Lavina Hamman, MD  dexamethasone (DECADRON) 10 MG/ML injection Inject 0.6 mLs (6 mg total) into the vein daily. 01/24/20   Lavina Hamman, MD  guaiFENesin (MUCINEX) 600 MG 12 hr tablet Take 1 tablet (600 mg total) by mouth 2 (two) times daily. 01/23/20   Lavina Hamman, MD  ipratropium (ATROVENT HFA) 17 MCG/ACT inhaler Inhale 2 puffs into the lungs every 6 (six) hours. 01/23/20   Lavina Hamman, MD  levalbuterol [facility] HFA) 45 MCG/ACT inhaler Inhale 2 puffs into the lungs every 6 (six) hours. 01/24/20   Lavina Hamman, MD  metoprolol tartrate (LOPRESSOR) 25 MG tablet Take 1 tablet (25 mg total) by mouth 2 (two) times daily. Patient taking differently: Take 50 mg by mouth 2 (two) times daily.  01/18/19   Gold, Wayne E, PA-C  rosuvastatin (CRESTOR) 10 MG tablet Take 10 mg by mouth at bedtime. 07/13/19   [provider]    Physical Exam: Vitals:   01/24/20 0200  BP: (!) 167/85  Pulse: 81  Resp: 20  Temp: 98.7 F (37.1 C)  TempSrc: Oral  SpO2: 98%  Weight: 72.7 kg  Height: 5\' 6"  (1.676 m)    Constitutional: NAD, calm  Eyes: PERTLA, lids and conjunctivae normal ENMT: Mucous membranes are dry. Posterior pharynx clear of any exudate or lesions.   Neck: normal, supple, no masses, no thyromegaly Respiratory: mild tachypnea, speaking full sentences, no wheezing. No  accessory muscle use.  Cardiovascular: S1 & S2 heard, regular rate and rhythm. No extremity edema.   Abdomen: No distension, no tenderness, soft. Bowel sounds active.  Musculoskeletal: no clubbing / cyanosis. No joint deformity upper and lower extremities.   Skin: no significant rashes, lesions, ulcers. Warm, dry, well-perfused. Neurologic: No facial asymmetry. Sensation intact. Moving all extremities.  Psychiatric: Alert and oriented to person, place, and situation. Calm.    Labs and Imaging on Admission: I have personally reviewed following labs and imaging studies  CBC: Recent Labs  Lab 01/22/20 1546 01/23/20 0632  WBC 14.6* 13.2*  NEUTROABS 12.9* 11.5*  HGB 14.0 13.8  HCT 41.4 42.3  MCV 91.2 94.2  PLT 210 578   Basic Metabolic Panel: Recent Labs  Lab 01/22/20 1546 01/23/20 0632  NA 137 137  K 4.1 4.2  CL 108 109  CO2 19* 18*  GLUCOSE 117* 88  BUN 41* 33*  CREATININE 1.57* 1.21  CALCIUM 8.1* 7.8*  MG  --  2.2   GFR: Estimated Creatinine  Clearance: 45.4 mL/min (by C-G formula based on SCr of 1.21 mg/dL). Liver Function Tests: Recent Labs  Lab 01/22/20 1546 01/23/20 0632  AST 59* 41  ALT 39 30  ALKPHOS 61 56  BILITOT 1.0 0.7  PROT 6.0* 6.1*  ALBUMIN 3.0* 2.9*   No results for input(s): LIPASE, AMYLASE in the last 168 hours. No results for input(s): AMMONIA in the last 168 hours. Coagulation Profile: No results for input(s): INR, PROTIME in the last 168 hours. Cardiac Enzymes: No results for input(s): CKTOTAL, CKMB, CKMBINDEX, TROPONINI in the last 168 hours. BNP (last 3 results) No results for input(s): PROBNP in the last 8760 hours. HbA1C: No results for input(s): HGBA1C in the last 72 hours. CBG: No results for input(s): GLUCAP in the last 168 hours. Lipid Profile: Recent Labs    01/22/20 1546  TRIG 80   Thyroid Function Tests: No results for input(s): TSH, T4TOTAL, FREET4, T3FREE, THYROIDAB in the last 72 hours. Anemia Panel: Recent Labs     01/22/20 1546 01/23/20 0632  FERRITIN 1,131* 1,133*   Urine analysis: No results found for: COLORURINE, APPEARANCEUR, LABSPEC, PHURINE, GLUCOSEU, HGBUR, BILIRUBINUR, KETONESUR, PROTEINUR, UROBILINOGEN, NITRITE, LEUKOCYTESUR Sepsis Labs: @LABRCNTIP (procalcitonin:4,lacticidven:4) ) Recent Results (from the past 240 hour(s))  Blood Culture (routine x 2)     Status: None (Preliminary result)   Collection Time: 01/22/20  3:49 PM   Specimen: BLOOD  Result Value Ref Range Status   Specimen Description BLOOD R FA  Final   Special Requests   Final    BOTTLES DRAWN AEROBIC AND ANAEROBIC Blood Culture adequate volume   Culture   Final    NO GROWTH < 24 HOURS Performed at [facility], [address], [place], [place] [address]    Report Status PENDING  Incomplete  Blood Culture (routine x 2)     Status: None (Preliminary result)   Collection Time: 01/22/20  3:55 PM   Specimen: BLOOD  Result Value Ref Range Status   Specimen Description BLOOD LAC  Final   Special Requests   Final    BOTTLES DRAWN AEROBIC AND ANAEROBIC Blood Culture results may not be optimal due to an excessive volume of blood received in culture bottles   Culture   Final    NO GROWTH < 24 HOURS Performed at [facility], [address] [place], [place] [address]    Report Status PENDING  Incomplete     Radiological Exams on Admission: US Venous Img Lower Bilateral (DVT)  Result Date: 01/22/2020 CLINICAL DATA:  79 year old male COVID-19. Fever and respiratory failure. Abnormal D-dimer. History of right leg greater saphenous vein harvest for CABG. EXAM: BILATERAL LOWER EXTREMITY VENOUS DOPPLER ULTRASOUND TECHNIQUE: Gray-scale sonography with graded compression, as well as color Doppler and duplex ultrasound were performed to evaluate the lower extremity deep venous systems from the level of the common femoral vein and including the common femoral, femoral, profunda femoral, popliteal and calf veins  including the posterior tibial, peroneal and gastrocnemius veins when visible. The superficial great saphenous vein was also interrogated. Spectral Doppler was utilized to evaluate flow at rest and with distal augmentation maneuvers in the common femoral, femoral and popliteal veins. COMPARISON:  CT Abdomen and Pelvis 02/26/2011. FINDINGS: RIGHT LOWER EXTREMITY Common Femoral Vein: No evidence of thrombus. Normal compressibility, respiratory phasicity and response to augmentation. Saphenofemoral Junction: No evidence of thrombus. Normal compressibility and flow on color Doppler imaging. Profunda Femoral Vein: No evidence of thrombus. Normal compressibility and flow on color Doppler imaging.  Femoral Vein: No evidence of thrombus. Normal compressibility, respiratory phasicity and response to augmentation. Popliteal Vein: No evidence of thrombus. Normal compressibility, respiratory phasicity and response to augmentation. Calf Veins: No evidence of thrombus. Normal compressibility and flow on color Doppler imaging. Other Findings:  None. LEFT LOWER EXTREMITY Common Femoral Vein: No evidence of thrombus. Normal compressibility, respiratory phasicity and response to augmentation. Saphenofemoral Junction: No evidence of thrombus. Normal compressibility and flow on color Doppler imaging. Profunda Femoral Vein: No evidence of thrombus. Normal compressibility and flow on color Doppler imaging. Femoral Vein: No evidence of thrombus. Normal compressibility, respiratory phasicity and response to augmentation. Popliteal Vein: No evidence of thrombus. Normal compressibility, respiratory phasicity and response to augmentation. Calf Veins: No evidence of thrombus. Normal compressibility and flow on color Doppler imaging. Visible Superficial Great Saphenous Vein: No evidence of thrombus. Normal compressibility. Other Findings:  None. IMPRESSION: No evidence of deep venous thrombosis in either lower extremity. Electronically Signed    By: Genevie Ann M.D.   On: 01/22/2020 20:21   DG Chest Port 1 View  Result Date: 01/23/2020 CLINICAL DATA:  COVID positive patient with shortness of breath. EXAM: PORTABLE CHEST 1 VIEW COMPARISON:  Single-view of the chest 01/22/2020. PA and lateral chest 02/14/2019. FINDINGS: Right worse than left airspace disease is again seen and has somewhat worsened, particularly in the left mid and lower lung zones. The lungs are emphysematous. Heart size is normal. The patient is status post CABG. No pneumothorax or pleural effusion. IMPRESSION: Persistent multifocal pneumonia is worse on the right and shows progression on the left. Emphysema. Electronically Signed   By: Inge Rise M.D.   On: 01/23/2020 11:08   DG Chest Port 1 View  Result Date: 01/22/2020 CLINICAL DATA:  Fever. COVID-19 patient. EXAM: PORTABLE CHEST 1 VIEW COMPARISON:  February 14, 2019 FINDINGS: Infiltrates are seen in the lung bases, right greater than left. No pneumothorax. The heart size borderline. The hila and mediastinum are normal. No other abnormalities. IMPRESSION: Bibasilar infiltrates, right greater than left, consistent with COVID-19 pneumonia given history. Electronically Signed   By: Dorise Bullion III M.D   On: 01/22/2020 16:29    EKG: Independently reviewed. Sinus rhythm, incomplete RBBB, LAFB.   Assessment/Plan   1. COVID-19 infection; acute hypoxic respiratory failure; sepsis; suspected bacterial PNA  - Diagnosed on 01/10/20, started on steroids and antibiotics as outpatient, continued to worsen and was admitted to [facility] on 01/22/20 where he was requiring 4 Lpm initially with procalcitonin of 2.21  - Blood cultures were collected in ED, IVF bolus was given, and he was started on Rocephin, azithromycin, Decadron, and remdesivir  - Hypotension and lactic acidosis resolved with IVF at [facility] current abx, continue steroids and remdesivir, continue supplemental O2, trend markers, prone as tolerated    2. CAD;  elevated troponin  - HS troponin was elevated to 252 in ED without chest pain or acute ischemic changes on EKG and second troponin was downtrending; this was in setting of hypotension     - Continue statin and beta-blocker; pt reports hx of GIB on ASA and does not take antiplatelet     3. Acute kidney injury superimposed on CKD III  - AKI resolved with IVF hydration at [facility] with SCr at apparent baseline of 1.21 on admission to [facility]  - Continue to renally-dose medications, monitor    4. COPD  - No wheezing  - Continue ICS/LABA and Combivent   5. Hypertension  - BP modestly elevated  on arrival to [facility] - Lisinopril had been held in light of initial hypotension and AKI, both of which have resolved  - Continue metoprolol, resume lisinopril   6. Elevated d-dimer  - 4.3 at [facility] without chest pain  - Bilateral LE dopplers negataive for DVT at [facility]  - Trend     DVT prophylaxis: SCD's, patient refused pharmacologic VTE ppx d/t h/o bleeding hemorrhoids despite discussion that risk of PE in setting of COVID likely outweighs his risk of significant bleeding  Code Status: Partial, confirmed with patient that he does not want to be intubated or receive chest compressions but okay with ACLS meds and cardioversion if needed  Family Communication: Discussed with patient  Consults called: None  Admission status: Inpatient     Vianne Bulls, MD [facility] Pager: See [email or web address]  If 7AM-7PM, please contact the daytime attending [email or web address]  01/24/2020, 2:23 AM

## 2020-01-24 NOTE — Evaluation (Signed)
Physical Therapy Evaluation Patient Details Name: Anthony Morrison MRN: [id number] DOB: 1941-10-06 Today's Date: 01/24/2020   History of Present Illness  Anthony Morrison is a 79 y.o. male with medical history significant for emphysema, coronary artery disease status post CABG, and chronic kidney disease stage II-III who was diagnosed with COVID-19 on 01/10/2020 (visible in [place]), was started on systemic steroid and antibiotic in the outpatient setting, but continued to worsen and presented to the emergency department on 01/22/2020. He developed general malaise and cough ~1/15 and went on to develop some dyspnea and sputum production, but has not had any chest pain, abdominal pain, or leg swelling or tenderness.  Clinical Impression   Pt admitted with above diagnosis. 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 He denies using DME/AD prior to hospitalization and states has access to needed equipment at d/c. Pt currently with functional limitations due to the deficits listed below (see PT Problem List).  Pt was quite fatigued this pm, had been in chair for some time since lunch but agreeable to mobility. He has been educated on importance of mobility and also completing breathing exercises. Pt was able to ambulate in room approx 57ft with stand by assist, pt was on 15L/min via HFNC and desat to min of 80% with ambulation. Once completed and sitting edge of bed able to complete pursed lip breathing and recover within <28mins to low 90s. Pt returned to 12L/min at rest and maintained sats in 90s. Pt will benefit from skilled PT to increase their independence and safety with mobility to allow discharge to the venue listed below.       Follow Up Recommendations No PT follow up    Equipment Recommendations  None recommended by PT    Recommendations for Other Services       Precautions / Restrictions  Precautions Precautions: Other (comment) Precaution Comments: monitor 02 sats desats with activity Restrictions Weight Bearing Restrictions: No      Mobility  Bed Mobility Overal bed mobility: Needs Assistance Bed Mobility: Supine to Sit;Sit to Supine     Supine to sit: Supervision Sit to supine: Supervision   General bed mobility comments: set up and line management  Transfers Overall transfer level: Needs assistance Equipment used: None Transfers: Sit to/from Stand Sit to Stand: Supervision;Min guard         General transfer comment: set up and line management  Ambulation/Gait Ambulation/Gait assistance: Supervision Gait Distance (Feet): 58 Feet Assistive device: None Gait Pattern/deviations: Step-through pattern Gait velocity: decreaed   General Gait Details: ambulated on 15L/min via HFNC and desat to min 80%, needed set up and stand by assist  Stairs            Wheelchair Mobility    Modified Rankin (Stroke Patients Only)       Balance Overall balance assessment: Needs assistance Sitting-balance support: Feet supported Sitting balance-Leahy Scale: Good     Standing balance support: During functional activity Standing balance-Leahy Scale: Fair                               Pertinent Vitals/Pain Pain Assessment: No/denies pain    Home Living Family/patient expects to be discharged to:: Private residence Living Arrangements: Spouse/significant other;Children Available Help at Discharge: Family Type of Home: Apartment(condo) Home Access: Level entry     Home Layout: One level Home  Equipment: Electronics engineer Comments: states through community he oversees he is able to get access to any DME/AD he needs    Prior Function Level of Independence: Independent         Comments: overseeing condo complex     Hand Dominance   Dominant Hand: Right    Extremity/Trunk Assessment   Upper Extremity Assessment Upper  Extremity Assessment: Generalized weakness    Lower Extremity Assessment Lower Extremity Assessment: Generalized weakness    Cervical / Trunk Assessment Cervical / Trunk Assessment: Normal  Communication   Communication: No difficulties  Cognition Arousal/Alertness: Lethargic Behavior During Therapy: WFL for tasks assessed/performed Overall Cognitive Status: Within Functional Limits for tasks assessed                                        General Comments      Exercises     Assessment/Plan    PT Assessment Patient needs continued PT services  PT Problem List Decreased strength;Decreased activity tolerance;Decreased balance;Decreased mobility;Decreased coordination;Decreased safety awareness       PT Treatment Interventions Gait training;Functional mobility training;Therapeutic activities;Therapeutic exercise;Balance training;Neuromuscular re-education;Patient/family education    PT Goals (Current goals can be found in the Care Plan section)  Acute Rehab PT Goals Patient Stated Goal: Go back home PT Goal Formulation: With patient Time For Goal Achievement: 02/07/20 Potential to Achieve Goals: Good    Frequency Min 3X/week   Barriers to discharge        Co-evaluation               AM-PAC PT "6 Clicks" Mobility  Outcome Measure Help needed turning from your back to your side while in a flat bed without using bedrails?: None Help needed moving from lying on your back to sitting on the side of a flat bed without using bedrails?: None Help needed moving to and from a bed to a chair (including a wheelchair)?: A Little Help needed standing up from a chair using your arms (e.g., wheelchair or bedside chair)?: A Little Help needed to walk in hospital room?: A Little Help needed climbing 3-5 steps with a railing? : A Little 6 Click Score: 20    End of Session Equipment Utilized During Treatment: Oxygen Activity Tolerance: Treatment limited  secondary to medical complications (Comment);Patient limited by fatigue Patient left: in bed;with call bell/phone within reach Nurse Communication: Mobility status PT Visit Diagnosis: Other abnormalities of gait and mobility (R26.89)    Time: 7893-8101 PT Time Calculation (min) (ACUTE ONLY): 23 min   Charges:   PT Evaluation $PT Eval Moderate Complexity: 1 Mod PT Treatments $Therapeutic Activity: 8-22 mins        Horald Chestnut, PT   Delford Field 01/24/2020, 3:50 PM

## 2020-01-24 NOTE — Progress Notes (Signed)
Occupational Therapy Evaluation Patient Details Name: Anthony Morrison MRN: [id number] DOB: 12/27/1940 Today's Date: 01/24/2020    History of Present Illness Anthony Morrison is a 79 y.o. male with medical history significant for emphysema, coronary artery disease status post CABG, and chronic kidney disease stage II-III who was diagnosed with COVID-19 on 01/10/2020 (visible in [place]), was started on systemic steroid and antibiotic in the outpatient setting, but continued to worsen and presented to the emergency department on 01/22/2020. He developed general malaise and cough ~1/15 and went on to develop some dyspnea and sputum production, but has not had any chest pain, abdominal pain, or leg swelling or tenderness.   Clinical Impression   Patient is very kind.  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  He was independent prior.  Patient able to perform mobility and transfers with min guard.  Completed toileting with supervision. Patient was on 10L O2 at rest with SpO2 86.  With mobility he required 15L O2 to maintain upper 80s SpO2, as he dropped to 81 while on 10L.  Patient's activity tolerance was poor and grooming was completed seated.  Will continue to follow with OT acutely.    Follow Up Recommendations  No OT follow up    Equipment Recommendations  None recommended by OT    Recommendations for Other Services       Precautions / Restrictions Precautions Precautions: None Precaution Comments: desat      Mobility Bed Mobility Overal bed mobility: Modified Independent                Transfers Overall transfer level: Needs assistance   Transfers: Sit to/from Stand Sit to Stand: Min guard              Balance Overall balance assessment: Needs assistance Sitting-balance support: Feet supported Sitting balance-Leahy Scale: Good     Standing balance support: During functional activity;No upper extremity  supported Standing balance-Leahy Scale: Fair                             ADL either performed or assessed with clinical judgement   ADL Overall ADL's : Needs assistance/impaired Eating/Feeding: Independent;Sitting   Grooming: Min guard;Standing   Upper Body Bathing: Sitting;Supervision/ safety;Set up   Lower Body Bathing: Sit to/from stand;Minimal assistance   Upper Body Dressing : Set up;Sitting;Supervision/safety   Lower Body Dressing: Sit to/from stand;Minimal assistance   Toilet Transfer: Min guard   Toileting- Clothing Manipulation and Hygiene: Sitting/lateral lean;Supervision/safety       Functional mobility during ADLs: Min guard       Vision         Perception     Praxis      Pertinent Vitals/Pain Pain Assessment: No/denies pain     Hand Dominance     Extremity/Trunk Assessment Upper Extremity Assessment Upper Extremity Assessment: Generalized weakness           Communication Communication Communication: No difficulties   Cognition Arousal/Alertness: Awake/alert Behavior During Therapy: WFL for tasks assessed/performed Overall Cognitive Status: Within Functional Limits for tasks assessed                                     General Comments       Exercises Exercises: Other exercises Other Exercises Other Exercises:  x10 flutter valve Other Exercises: x10 incentive spirometer   Shoulder Instructions      Home Living Family/patient expects to be discharged to:: Private residence Living Arrangements: Spouse/significant other;Children Available Help at Discharge: Family Type of Home: Apartment Home Access: Level entry     Home Layout: One level     Bathroom Shower/Tub: Walk-in shower;Tub/shower unit   [employer]: Standard     Home Equipment: Shower seat          Prior Functioning/Environment Level of Independence: Independent        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         OT Problem List: Decreased strength;Decreased activity tolerance;Impaired balance (sitting and/or standing);Cardiopulmonary status limiting activity      OT Treatment/Interventions: Self-care/ADL training;Therapeutic exercise;Energy conservation;Therapeutic activities;Patient/family education;Balance training    OT Goals(Current goals can be found in the care plan section) Acute Rehab OT Goals Patient Stated Goal: Go back home OT Goal Formulation: With patient Time For Goal Achievement: 02/07/20 Potential to Achieve Goals: Good  OT Frequency: Min 3X/week   Barriers to D/C:            Co-evaluation              AM-PAC OT "6 Clicks" Daily Activity     Outcome Measure Help from another person eating meals?: None Help from another person taking care of personal grooming?: A Little Help from another person toileting, which includes using toliet, bedpan, or urinal?: A Little Help from another person bathing (including washing, rinsing, drying)?: A Little Help from another person to put on and taking off regular upper body clothing?: A Little Help from another person to put on and taking off regular lower body clothing?: A Little 6 Click Score: 19   End of Session Equipment Utilized During Treatment: Gait belt;Oxygen Nurse Communication: Mobility status  Activity Tolerance: Patient limited by fatigue Patient left: in chair;with call bell/phone within reach  OT Visit Diagnosis: Unsteadiness on feet (R26.81);Muscle weakness (generalized) (M62.81)                Time: 1324-4010 OT Time Calculation (min): 60 min Charges:  OT General Charges $OT Visit: 1 Visit OT Evaluation $OT Eval Moderate Complexity: 1 Mod OT Treatments $Self Care/Home Management : 38-52 mins  [name], OTR/L    [name] 01/24/2020, 12:48 PM

## 2020-01-24 NOTE — Progress Notes (Signed)
[facility] PROGRESS NOTE    Progress Note  ASCHER SCHROEPFER  OHY:[id number] DOB: [birth year] DOA: 01/24/2020 PCP: Rusty Aus, MD     Brief Narrative:   Anthony Morrison is an 79 y.o. male past medical history significant for IMA, coronary artery disease status post CABG, chronic kidney disease stage II-III who was diagnosed with COVID-19 on 01/10/2020 was started on systemic steroids and antibiotics during this time as an outpatient progressively got worse came into the ED on 01/22/2020 and was home from the ED.  He continued to feel progressively worse with cough and shortness of breath.  He called EMS as he was significantly fatigued and short of breath was found to be febrile hypotensive and hypoxic and taken to [facility] ED in the ED he was found to be satting 80% on room air with a blood pressure of 60 and EKG showing an incomplete right bundle branch block left anterior fascicular block and sinus rhythm chest x-ray showed bilateral infiltrates with elevated creatinine leukocytosis and elevated procalcitonin as well as lactic acid.  Assessment/Plan:   Principal Problem:   Acute hypoxemic respiratory failure due to COVID-19 [facility]) Active Problems:   ASCVD (arteriosclerotic cardiovascular disease)   CAP (community acquired pneumonia)   Acute renal failure superimposed on stage 3 chronic kidney disease (HCC)   Elevated troponin   COPD (chronic obstructive pulmonary disease) (HCC) We are concerned about the patient being septic I agree with IV Rocephin and azithromycin blood cultures have been ordered. He was fluid resuscitated at [facility], he was also started on IV remdesivir and steroids.  Cultures are pending continue on supplemental oxygen.  He is currently on 10 L of high flow nasal cannula and saturations have been greater than 92%. We have talked to the family about prognosis due to his age and comorbidities. He denies any chest pain and EKG showed no signs of ischemia  second set of cardiac biomarkers are trending down, likely elevated troponins in the setting of chronic renal disease and hypotension.  I agree with continuing statins and beta-blockers.  Continue IV fluids for his acute kidney injury.    DVT prophylaxis: lovenox Family Communication:Wife Disposition Plan/Barrier to D/C: Once he is off oxygen.  Code Status:     Code Status Orders  (From admission, onward)         Start     Ordered   01/24/20 0325  Limited resuscitation (code)  Continuous    Question Answer Comment  In the event of cardiac or respiratory ARREST: Initiate Code Blue, Call Rapid Response Yes   In the event of cardiac or respiratory ARREST: Perform CPR No   In the event of cardiac or respiratory ARREST: Perform Intubation/Mechanical Ventilation No   In the event of cardiac or respiratory ARREST: Use NIPPV/BiPAp only if indicated Yes   In the event of cardiac or respiratory ARREST: Administer ACLS medications if indicated Yes   In the event of cardiac or respiratory ARREST: Perform Defibrillation or Cardioversion if indicated Yes      01/24/20 0324        Code Status History    Date Active Date Inactive Code Status Order ID Comments User Context   01/24/2020 0222 01/24/2020 0324 Partial Code [id number]  Vianne Bulls, MD Inpatient   01/23/2020 1825 01/24/2020 0149 Partial Code [id number]  Lavina Hamman, MD Inpatient   01/22/2020 1856 01/23/2020 1825 DNR [id number]  Ezekiel Slocumb, DO ED   01/22/2020 1747 01/22/2020 1856 Full Code  [id number]  Ezekiel Slocumb, DO ED   01/14/2019 1958 01/18/2019 1708 Full Code [id number]  Marcene Corning Inpatient   01/14/2019 0841 01/14/2019 1214 Full Code [id number]  Corey Skains, MD Inpatient   01/13/2017 1830 01/14/2017 1853 Full Code [id number]  Epifanio Lesches, MD ED   Advance Care Planning Activity        IV Access:    Peripheral IV   Procedures and diagnostic studies:   US Venous Img Lower Bilateral  (DVT)  Result Date: 01/22/2020 CLINICAL DATA:  79 year old male COVID-19. Fever and respiratory failure. Abnormal D-dimer. History of right leg greater saphenous vein harvest for CABG. EXAM: BILATERAL LOWER EXTREMITY VENOUS DOPPLER ULTRASOUND TECHNIQUE: Gray-scale sonography with graded compression, as well as color Doppler and duplex ultrasound were performed to evaluate the lower extremity deep venous systems from the level of the common femoral vein and including the common femoral, femoral, profunda femoral, popliteal and calf veins including the posterior tibial, peroneal and gastrocnemius veins when visible. The superficial great saphenous vein was also interrogated. Spectral Doppler was utilized to evaluate flow at rest and with distal augmentation maneuvers in the common femoral, femoral and popliteal veins. COMPARISON:  CT Abdomen and Pelvis 02/26/2011. FINDINGS: RIGHT LOWER EXTREMITY Common Femoral Vein: No evidence of thrombus. Normal compressibility, respiratory phasicity and response to augmentation. Saphenofemoral Junction: No evidence of thrombus. Normal compressibility and flow on color Doppler imaging. Profunda Femoral Vein: No evidence of thrombus. Normal compressibility and flow on color Doppler imaging. Femoral Vein: No evidence of thrombus. Normal compressibility, respiratory phasicity and response to augmentation. Popliteal Vein: No evidence of thrombus. Normal compressibility, respiratory phasicity and response to augmentation. Calf Veins: No evidence of thrombus. Normal compressibility and flow on color Doppler imaging. Other Findings:  None. LEFT LOWER EXTREMITY Common Femoral Vein: No evidence of thrombus. Normal compressibility, respiratory phasicity and response to augmentation. Saphenofemoral Junction: No evidence of thrombus. Normal compressibility and flow on color Doppler imaging. Profunda Femoral Vein: No evidence of thrombus. Normal compressibility and flow on color Doppler  imaging. Femoral Vein: No evidence of thrombus. Normal compressibility, respiratory phasicity and response to augmentation. Popliteal Vein: No evidence of thrombus. Normal compressibility, respiratory phasicity and response to augmentation. Calf Veins: No evidence of thrombus. Normal compressibility and flow on color Doppler imaging. Visible Superficial Great Saphenous Vein: No evidence of thrombus. Normal compressibility. Other Findings:  None. IMPRESSION: No evidence of deep venous thrombosis in either lower extremity. Electronically Signed   By: Genevie Ann M.D.   On: 01/22/2020 20:21   DG Chest Port 1 View  Result Date: 01/23/2020 CLINICAL DATA:  COVID positive patient with shortness of breath. EXAM: PORTABLE CHEST 1 VIEW COMPARISON:  Single-view of the chest 01/22/2020. PA and lateral chest 02/14/2019. FINDINGS: Right worse than left airspace disease is again seen and has somewhat worsened, particularly in the left mid and lower lung zones. The lungs are emphysematous. Heart size is normal. The patient is status post CABG. No pneumothorax or pleural effusion. IMPRESSION: Persistent multifocal pneumonia is worse on the right and shows progression on the left. Emphysema. Electronically Signed   By: Inge Rise M.D.   On: 01/23/2020 11:08   DG Chest Port 1 View  Result Date: 01/22/2020 CLINICAL DATA:  Fever. COVID-19 patient. EXAM: PORTABLE CHEST 1 VIEW COMPARISON:  February 14, 2019 FINDINGS: Infiltrates are seen in the lung bases, right greater than left. No pneumothorax. The heart size borderline. The hila and mediastinum are normal. No other abnormalities. IMPRESSION: Bibasilar  infiltrates, right greater than left, consistent with COVID-19 pneumonia given history. Electronically Signed   By: Dorise Bullion III M.D   On: 01/22/2020 16:29     Medical Consultants:    None.  Anti-Infectives:   IV Rocephin and azithromycin and IV remdesivir  Subjective:    ZYAIR RUSSI relates he does  not feel well.  Objective:    Vitals:   01/24/20 0200 01/24/20 0402  BP: (!) 167/85 (!) 163/90  Pulse: 81 92  Resp: 20 (!) 22  Temp: 98.7 F (37.1 C) 98.8 F (37.1 C)  TempSrc: Oral Oral  SpO2: 98% 92%  Weight: 72.7 kg   Height: 5\' 6"  (1.676 m)    SpO2: 92 % O2 Flow Rate (L/min): 10 L/min   Intake/Output Summary (Last 24 hours) at 01/24/2020 5329 Last data filed at 01/24/2020 0500 Gross per 24 hour  Intake 500 ml  Output 300 ml  Net 200 ml   Filed Weights   01/24/20 0200  Weight: 72.7 kg    Exam: General exam: In no acute distress. Respiratory system: Good air movement and clear to auscultation. Cardiovascular system: S1 & S2 heard, RRR. No JVD. Gastrointestinal system: Abdomen is nondistended, soft and nontender.  Extremities: No pedal edema. Skin: No rashes, lesions or ulcers  Data Reviewed:    Labs: Basic Metabolic Panel: Recent Labs  Lab 01/22/20 1546 01/22/20 1546 01/23/20 0632 01/24/20 0411  NA 137  --  137 137  K 4.1   < > 4.2 4.4  CL 108  --  109 108  CO2 19*  --  18* 20*  GLUCOSE 117*  --  88 87  BUN 41*  --  33* 30*  CREATININE 1.57*  --  1.21 1.16  CALCIUM 8.1*  --  7.8* 8.0*  MG  --   --  2.2 2.1   < > = values in this interval not displayed.   GFR Estimated Creatinine Clearance: 47.4 mL/min (by C-G formula based on SCr of 1.16 mg/dL). Liver Function Tests: Recent Labs  Lab 01/22/20 1546 01/23/20 0632 01/24/20 0411  AST 59* 41 42*  ALT 39 30 30  ALKPHOS 61 56 54  BILITOT 1.0 0.7 0.7  PROT 6.0* 6.1* 6.0*  ALBUMIN 3.0* 2.9* 2.7*   No results for input(s): LIPASE, AMYLASE in the last 168 hours. No results for input(s): AMMONIA in the last 168 hours. Coagulation profile No results for input(s): INR, PROTIME in the last 168 hours. COVID-19 Labs  Recent Labs    01/22/20 1546 01/22/20 1546 01/22/20 1552 01/23/20 0632 01/24/20 0411  DDIMER  --   --   --   --  2.86*  FERRITIN 1,131*  --   --  1,133* 1,082*  LDH 357*  --   --   325*  --   CRP 12.4*   < > 14.6* 16.9* 16.7*   < > = values in this interval not displayed.    No results found for: SARSCOV2NAA  CBC: Recent Labs  Lab 01/22/20 1546 01/23/20 0632 01/24/20 0411  WBC 14.6* 13.2* 12.7*  NEUTROABS 12.9* 11.5* 10.8*  HGB 14.0 13.8 13.8  HCT 41.4 42.3 40.9  MCV 91.2 94.2 92.5  PLT 210 184 197   Cardiac Enzymes: No results for input(s): CKTOTAL, CKMB, CKMBINDEX, TROPONINI in the last 168 hours. BNP (last 3 results) No results for input(s): PROBNP in the last 8760 hours. CBG: No results for input(s): GLUCAP in the last 168 hours. D-Dimer: Recent Labs  01/24/20 0411  DDIMER 2.86*   Hgb A1c: No results for input(s): HGBA1C in the last 72 hours. Lipid Profile: Recent Labs    01/22/20 1546  TRIG 80   Thyroid function studies: No results for input(s): TSH, T4TOTAL, T3FREE, THYROIDAB in the last 72 hours.  Invalid input(s): FREET3 Anemia work up: Recent Labs    01/23/20 0632 01/24/20 0411  FERRITIN 1,133* 1,082*   Sepsis Labs: Recent Labs  Lab 01/22/20 1546 01/22/20 1737 01/23/20 0632 01/24/20 0411  PROCALCITON 2.21  --   --   --   WBC 14.6*  --  13.2* 12.7*  LATICACIDVEN 2.0* 1.0  --   --    Microbiology Recent Results (from the past 240 hour(s))  Blood Culture (routine x 2)     Status: None (Preliminary result)   Collection Time: 01/22/20  3:49 PM   Specimen: BLOOD  Result Value Ref Range Status   Specimen Description BLOOD R FA  Final   Special Requests   Final    BOTTLES DRAWN AEROBIC AND ANAEROBIC Blood Culture adequate volume   Culture   Final    NO GROWTH < 24 HOURS Performed at [facility], [address], [place], [place] [address]    Report Status PENDING  Incomplete  Blood Culture (routine x 2)     Status: None (Preliminary result)   Collection Time: 01/22/20  3:55 PM   Specimen: BLOOD  Result Value Ref Range Status   Specimen Description BLOOD LAC  Final   Special Requests   Final     BOTTLES DRAWN AEROBIC AND ANAEROBIC Blood Culture results may not be optimal due to an excessive volume of blood received in culture bottles   Culture   Final    NO GROWTH < 24 HOURS Performed at [facility], [address] [place], [place] [address]    Report Status PENDING  Incomplete  Culture, sputum-assessment     Status: None   Collection Time: 01/24/20  2:22 AM   Specimen: Expectorated Sputum  Result Value Ref Range Status   Specimen Description EXPECTORATED SPUTUM  Final   Special Requests NONE  Final   Sputum evaluation   Final    THIS SPECIMEN IS ACCEPTABLE FOR SPUTUM CULTURE Performed at [facility], [place] [address] [place], [place] [address]    Report Status 01/24/2020 FINAL  Final     Medications:   . dexamethasone (DECADRON) injection  6 mg Intravenous Q24H  . ipratropium  2 puff Inhalation Q6H  . lisinopril  10 mg Oral Daily  . metoprolol tartrate  50 mg Oral BID  . mometasone-formoterol  2 puff Inhalation BID  . rosuvastatin  10 mg Oral q1800  . sodium chloride flush  3 mL Intravenous Q12H  . sodium chloride flush  3 mL Intravenous Q12H   Continuous Infusions: . sodium chloride    . azithromycin    . cefTRIAXone (ROCEPHIN)  IV    . remdesivir 100 mg in NS 100 mL        LOS: 0 days   Charlynne Cousins  [facility]  01/24/2020, 7:12 AM

## 2020-01-24 NOTE — Progress Notes (Signed)
Patient arrived from [facility] via [place].  Pt was transported on NRB with no issues.  Transferred patient to 15L HFNC upon arrivival, O2 saturations currently at 98%.  Patient denies any shortness of breath or chest pain.  Patient stated family was able to see him as he left [facility] and it was unnecessary for [place] to notify them of his arrival to [facility].  MD notified of patient arrival.

## 2020-01-25 DIAGNOSIS — J431 Panlobular emphysema: Secondary | ICD-10-CM

## 2020-01-25 DIAGNOSIS — E782 Mixed hyperlipidemia: Secondary | ICD-10-CM

## 2020-01-25 DIAGNOSIS — U071 COVID-19: Secondary | ICD-10-CM | POA: Diagnosis present

## 2020-01-25 DIAGNOSIS — J1282 Pneumonia due to coronavirus disease 2019: Secondary | ICD-10-CM

## 2020-01-25 DIAGNOSIS — N171 Acute kidney failure with acute cortical necrosis: Secondary | ICD-10-CM

## 2020-01-25 DIAGNOSIS — Z951 Presence of aortocoronary bypass graft: Secondary | ICD-10-CM

## 2020-01-25 DIAGNOSIS — N183 Chronic kidney disease, stage 3 unspecified: Secondary | ICD-10-CM

## 2020-01-25 DIAGNOSIS — I10 Essential (primary) hypertension: Secondary | ICD-10-CM

## 2020-01-25 LAB — COMPREHENSIVE METABOLIC PANEL
ALT: 28 U/L (ref 0–44)
AST: 32 U/L (ref 15–41)
Albumin: 2.4 g/dL — ABNORMAL LOW (ref 3.5–5.0)
Alkaline Phosphatase: 57 U/L (ref 38–126)
Anion gap: 8 (ref 5–15)
BUN: 31 mg/dL — ABNORMAL HIGH (ref 8–23)
CO2: 21 mmol/L — ABNORMAL LOW (ref 22–32)
Calcium: 7.9 mg/dL — ABNORMAL LOW (ref 8.9–10.3)
Chloride: 107 mmol/L (ref 98–111)
Creatinine, Ser: 1.07 mg/dL (ref 0.61–1.24)
GFR calc Af Amer: >60 mL/min (ref >60)
GFR calc non Af Amer: >60 mL/min (ref >60)
Glucose, Bld: 95 mg/dL (ref 70–99)
Potassium: 4.3 mmol/L (ref 3.5–5.1)
Sodium: 136 mmol/L (ref 135–145)
Total Bilirubin: 0.5 mg/dL (ref 0.3–1.2)
Total Protein: 5.4 g/dL — ABNORMAL LOW (ref 6.5–8.1)

## 2020-01-25 LAB — CBC WITH DIFFERENTIAL/PLATELET
Abs Immature Granulocytes: 0.1 10*3/uL — ABNORMAL HIGH (ref 0.00–0.07)
Basophils Absolute: 0 10*3/uL (ref 0.0–0.1)
Basophils Relative: 0 %
Eosinophils Absolute: 0 10*3/uL (ref 0.0–0.5)
Eosinophils Relative: 0 %
HCT: 39.9 % (ref 39.0–52.0)
Hemoglobin: 13.3 g/dL (ref 13.0–17.0)
Immature Granulocytes: 1 %
Lymphocytes Relative: 11 %
Lymphs Abs: 1.1 10*3/uL (ref 0.7–4.0)
MCH: 31 pg (ref 26.0–34.0)
MCHC: 33.3 g/dL (ref 30.0–36.0)
MCV: 93 fL (ref 80.0–100.0)
Monocytes Absolute: 0.6 10*3/uL (ref 0.1–1.0)
Monocytes Relative: 6 %
Neutro Abs: 8.1 10*3/uL — ABNORMAL HIGH (ref 1.7–7.7)
Neutrophils Relative %: 82 %
Platelets: 182 10*3/uL (ref 150–400)
RBC: 4.29 MIL/uL (ref 4.22–5.81)
RDW: 13.3 % (ref 11.5–15.5)
WBC: 9.8 10*3/uL (ref 4.0–10.5)
nRBC: 0 % (ref 0.0–0.2)

## 2020-01-25 LAB — HEPATITIS PANEL, ACUTE
HCV Ab: NONREACTIVE
Hep A IgM: NONREACTIVE
Hep B C IgM: NONREACTIVE
Hepatitis B Surface Ag: NONREACTIVE

## 2020-01-25 LAB — MAGNESIUM: Magnesium: 2.2 mg/dL (ref 1.7–2.4)

## 2020-01-25 LAB — PHOSPHORUS: Phosphorus: 3.1 mg/dL (ref 2.5–4.6)

## 2020-01-25 LAB — PROCALCITONIN
Procalcitonin: 0.44 ng/mL
Procalcitonin: 0.31 ng/mL

## 2020-01-25 LAB — FERRITIN: Ferritin: 857 ng/mL — ABNORMAL HIGH (ref 24–336)

## 2020-01-25 LAB — D-DIMER, QUANTITATIVE: D-Dimer, Quant: 6.59 ug/mL-FEU — ABNORMAL HIGH (ref 0.00–0.50)

## 2020-01-25 LAB — C-REACTIVE PROTEIN: CRP: 12.9 mg/dL — ABNORMAL HIGH (ref <1.0)

## 2020-01-25 LAB — HIV ANTIBODY (ROUTINE TESTING W REFLEX): HIV Screen 4th Generation wRfx: NONREACTIVE — AB

## 2020-01-25 LAB — LEGIONELLA PNEUMOPHILA SEROGP 1 UR AG: L. pneumophila Serogp 1 Ur Ag: NEGATIVE

## 2020-01-25 MED ORDER — SALINE SPRAY 0.65 % NA SOLN
2.0000 | NASAL | Status: DC | PRN
  Administered 2020-01-25: 18:00:00 2 via NASAL
  Filled 2020-01-25: qty 44

## 2020-01-25 MED ORDER — TOCILIZUMAB 400 MG/20ML IV SOLN
600.0000 mg | Freq: Once | INTRAVENOUS | Status: AC
  Administered 2020-01-25: 600 mg via INTRAVENOUS
  Filled 2020-01-25: qty 10

## 2020-01-25 MED ORDER — ASCORBIC ACID 500 MG PO TABS
500.0000 mg | ORAL_TABLET | Freq: Every day | ORAL | Status: DC
  Administered 2020-01-25 – 2020-02-03 (×10): 500 mg via ORAL
  Filled 2020-01-25 (×10): qty 1

## 2020-01-25 MED ORDER — ZINC SULFATE 220 (50 ZN) MG PO CAPS
220.0000 mg | ORAL_CAPSULE | Freq: Every day | ORAL | Status: DC
  Administered 2020-01-25 – 2020-02-03 (×10): 220 mg via ORAL
  Filled 2020-01-25 (×10): qty 1

## 2020-01-25 MED ORDER — IPRATROPIUM-ALBUTEROL 20-100 MCG/ACT IN AERS
1.0000 | INHALATION_SPRAY | Freq: Four times a day (QID) | RESPIRATORY_TRACT | Status: DC
  Administered 2020-01-25 – 2020-02-01 (×27): 1 via RESPIRATORY_TRACT
  Filled 2020-01-25: qty 4

## 2020-01-25 NOTE — Progress Notes (Signed)
Physical Therapy Treatment Patient Details Name: Anthony Morrison MRN: [id number] DOB: 09-21-1941 Today's Date: 01/25/2020    History of Present Illness Anthony Morrison is a 79 y.o. male with medical history significant for emphysema, coronary artery disease status post CABG, and chronic kidney disease stage II-III who was diagnosed with COVID-19 on 01/10/2020 (visible in [place]), was started on systemic steroid and antibiotic in the outpatient setting, but continued to worsen and presented to the emergency department on 01/22/2020. He developed general malaise and cough ~1/15 and went on to develop some dyspnea and sputum production, but has not had any chest pain, abdominal pain, or leg swelling or tenderness.    PT Comments    Pt did much better with mobility this am. Still needing encouragement to participate and can get snippy if he does not get his own way. PT needed set up and stand by assist with bed mobility, sightly more assist min guard with transfers this am, he was able to ambulate greater distance than previous session. Ambulated approx 146ft with hand held/min guard assist. Pt was on 12L/min via HFNC at rest and sats in 90s, with ambulation increased 02 to 15L/min via HFNC and noted desat to min 80%, at end with cues for pursed lip breathing and rest able to recover within 2 mins to low 90s again. Pt has been educated on importance of out of bed activity daily 2-3x also use of incentive spirometer and flutter valve hourly. Will continue to follow pt acutely and progress as he tolerates.    Follow Up Recommendations  No PT follow up     Equipment Recommendations  None recommended by PT    Recommendations for Other Services       Precautions / Restrictions Precautions Precautions: Other (comment);Fall Precaution Comments: monitor 02 sats desats with activity Restrictions Weight Bearing Restrictions: No    Mobility  Bed Mobility Overal bed mobility: Needs  Assistance Bed Mobility: Supine to Sit     Supine to sit: Supervision        Transfers Overall transfer level: Needs assistance Equipment used: None Transfers: Sit to/from Stand Sit to Stand: Min guard         General transfer comment: set up and line management  Ambulation/Gait Ambulation/Gait assistance: Min guard Gait Distance (Feet): 138 Feet Assistive device: 1 person hand held assist Gait Pattern/deviations: Step-through pattern;Narrow base of support     General Gait Details: ambulated on 15L/min via HFNC, noted some retropulsion this am, needing tatcile cues to slightly correct. on 15L/min desat to min 80% ambulating approx 132ft in room   Stairs             Wheelchair Mobility    Modified Rankin (Stroke Patients Only)       Balance Overall balance assessment: Needs assistance Sitting-balance support: Feet supported Sitting balance-Leahy Scale: Good     Standing balance support: During functional activity Standing balance-Leahy Scale: Fair                              Cognition Arousal/Alertness: Awake/alert Behavior During Therapy: WFL for tasks assessed/performed Overall Cognitive Status: Within Functional Limits for tasks assessed                                        Exercises Other Exercises Other Exercises: incentive spirometer x 10  Other Exercises: flutter valve x 10    General Comments        Pertinent Vitals/Pain Pain Assessment: No/denies pain    Home Living                      Prior Function            PT Goals (current goals can now be found in the care plan section) Acute Rehab PT Goals Patient Stated Goal: needs encouragement for mobility PT Goal Formulation: With patient Time For Goal Achievement: 02/07/20 Potential to Achieve Goals: Good Progress towards PT goals: Progressing toward goals    Frequency    Min 3X/week      PT Plan Current plan remains  appropriate    Co-evaluation              AM-PAC PT "6 Clicks" Mobility   Outcome Measure  Help needed turning from your back to your side while in a flat bed without using bedrails?: None Help needed moving from lying on your back to sitting on the side of a flat bed without using bedrails?: None Help needed moving to and from a bed to a chair (including a wheelchair)?: A Little Help needed standing up from a chair using your arms (e.g., wheelchair or bedside chair)?: A Little Help needed to walk in hospital room?: A Little Help needed climbing 3-5 steps with a railing? : A Little 6 Click Score: 20    End of Session Equipment Utilized During Treatment: Oxygen;Gait belt Activity Tolerance: Treatment limited secondary to medical complications (Comment) Patient left: in chair;with call bell/phone within reach Nurse Communication: Mobility status PT Visit Diagnosis: Other abnormalities of gait and mobility (R26.89)     Time: 3151-7616 PT Time Calculation (min) (ACUTE ONLY): 23 min  Charges:  $Gait Training: 8-22 mins $Therapeutic Activity: 8-22 mins                     Horald Chestnut, PT    Delford Field 01/25/2020, 12:23 PM

## 2020-01-25 NOTE — Progress Notes (Addendum)
PROGRESS NOTE    Anthony Morrison  XLK:[id number] DOB: 05/06/1941 DOA: 01/24/2020 PCP: Rusty Aus, MD   Brief Narrative:   79 y.o. WM PMHx IMA, CAD s/p CABG, CKD stage II-III, COPD  who was diagnosed with COVID-19 on 01/10/2020 was started on systemic steroids and antibiotics during this time as an outpatient progressively got worse came into the ED on 01/22/2020 and was home from the ED.  He continued to feel progressively worse with cough and shortness of breath.  He called EMS as he was significantly fatigued and short of breath was found to be febrile hypotensive and hypoxic and taken to [facility] ED in the ED he was found to be satting 80% on room air with a blood pressure of 60 and EKG showing an incomplete right bundle branch block left anterior fascicular block and sinus rhythm chest x-ray showed bilateral infiltrates with elevated creatinine leukocytosis and elevated procalcitonin as well as lactic acid.   Subjective: A/O x4, positive S OB   Assessment & Plan:   Principal Problem:   Acute hypoxemic respiratory failure due to COVID-19 [facility]) Active Problems:   S/P CABG x 3   ASCVD (arteriosclerotic cardiovascular disease)   Benign essential HTN   Hyperlipidemia, mixed   Panlobular emphysema (HCC)   CAP (community acquired pneumonia)   Acute renal failure superimposed on stage 3 chronic kidney disease (HCC)   Elevated troponin   Sepsis (HCC)   COPD (chronic obstructive pulmonary disease) (HCC)   Pneumonia due to COVID-19 virus   Covid pneumonia/acute respiratory failure with pneumonia COVID-19 Labs  Recent Labs    01/22/20 1546 01/22/20 1552 01/23/20 0632 01/24/20 0411 01/25/20 0253  DDIMER  --   --   --  2.86* 6.59*  FERRITIN 1,131*   < > 1,133* 1,082* 857*  LDH 357*  --  325*  --   --   CRP 12.4*   < > 16.9* 16.7* 12.9*   < > = values in this interval not displayed.    Tested COVID-19 on 01/10/2020 (not in EMR)  -Decadron 6 mg daily -Remdesivir per  pharmacy protocol -2/3 Actemra x1 dose -Vitamins per Covid protocol -Combivent -Dulera 200-5 mcg/ACT    CAP -Trend procalcitonin -Continue current antibiotics x5-day course    COPD -DC smoking 5 years ago -Dulera for home Advair -Not on home O2  CAD s/p CABG January 2020 -TEE 1/24 EF 60-65% -Currently negative CP -Strict in and out -Daily weight -Lisinopril 10 mg daily -Metoprolol 50 mg BID  Essential HTN -See CAD  Anxiety -Ativan  Hx hemorrhoids -Per EMR Pt has been refusing aspirin and lovenox due to h/o bleeding hemorrhoids.  Dr. Posey Pronto explained to him that pathophysiology of covid and increased risk of clot/thrombosis.   Review of MAR PTA shows patient not on aspirin, Plavix      DVT prophylaxis: None see hemorrhoids Code Status: Partial Family Communication: 2/3 Spoke [name] (son) counseled on plan of care, answered all questions Disposition Plan:    Consultants:    Procedures/Significant Events:     I have personally reviewed and interpreted all radiology studies and my findings are as above.  VENTILATOR SETTINGS: HFNC 2/3 Flow; 9 L/min SPO2; 98%   Cultures 2/2 respiratory culture pending 2/2 sputum pending 2/2 HIV screen negative 2/3 acute hepatitis panel negative   Antimicrobials: Anti-infectives (From admission, onward)   Start     Dose/Rate Stop   01/24/20 2100  azithromycin (ZITHROMAX) 500 mg in sodium chloride 0.9 % 250  mL IVPB    Note to Pharmacy: Started at [facility] but not sure time of last dose   500 mg 250 mL/hr over 60 Minutes 01/27/20 2059   01/24/20 1400  cefTRIAXone (ROCEPHIN) 2 g in sodium chloride 0.9 % 100 mL IVPB    Note to Pharmacy: Started at [facility] but not sure time of last dose   2 g 200 mL/hr over 30 Minutes 01/27/20 1359   01/24/20 1000  remdesivir 100 mg in sodium chloride 0.9 % 100 mL IVPB     100 mg 200 mL/hr over 30 Minutes 01/28/20 0959       Devices    LINES / TUBES:      Continuous Infusions: .  sodium chloride    . azithromycin 500 mg (01/24/20 2039)  . cefTRIAXone (ROCEPHIN)  IV 2 g (01/24/20 1327)  . remdesivir 100 mg in NS 100 mL 100 mg (01/24/20 0912)     Objective: Vitals:   01/24/20 1900 01/24/20 2035 01/25/20 0305 01/25/20 0738  BP:  131/70 (!) 156/91   Pulse:  79 92 (!) 56  Resp: 20 20 18  (!) 26  Temp:  97.9 F (36.6 C) 98 F (36.7 C)   TempSrc:  Oral Oral   SpO2:  94% 92% 100%  Weight:      Height:        Intake/Output Summary (Last 24 hours) at 01/25/2020 0755 Last data filed at 01/25/2020 0600 Gross per 24 hour  Intake 480 ml  Output 850 ml  Net -370 ml   Filed Weights   01/24/20 0200  Weight: 72.7 kg    Examination:  General: A/O x4, positive acute respiratory distress, cachectic Eyes: negative scleral hemorrhage, negative anisocoria, negative icterus ENT: Negative Runny nose, negative gingival bleeding, Neck:  Negative scars, masses, torticollis, lymphadenopathy, JVD Lungs: Decreased breath sounds bilaterally without wheezes or crackles Cardiovascular: Regular rate and rhythm without murmur gallop or rub normal S1 and S2 Abdomen: negative abdominal pain, nondistended, positive soft, bowel sounds, no rebound, no ascites, no appreciable mass Extremities: No significant cyanosis, clubbing, or edema bilateral lower extremities Skin: Negative rashes, lesions, ulcers Psychiatric:  Negative depression, negative anxiety, negative fatigue, negative mania POOR understanding of current disease process Central nervous system:  Cranial nerves II through XII intact, tongue/uvula midline, all extremities muscle strength 5/5, sensation intact throughout,  negative dysarthria, negative expressive aphasia, negative receptive aphasia.  .     Data Reviewed: Care during the described time interval was provided by me .  I have reviewed this patient's available data, including medical history, events of note, physical examination, and all test results as part of my  evaluation.   CBC: Recent Labs  Lab 01/22/20 1546 01/23/20 0632 01/24/20 0411 01/25/20 0253  WBC 14.6* 13.2* 12.7* 9.8  NEUTROABS 12.9* 11.5* 10.8* 8.1*  HGB 14.0 13.8 13.8 13.3  HCT 41.4 42.3 40.9 39.9  MCV 91.2 94.2 92.5 93.0  PLT 210 184 197 546   Basic Metabolic Panel: Recent Labs  Lab 01/22/20 1546 01/23/20 0632 01/24/20 0411 01/25/20 0253  NA 137 137 137 136  K 4.1 4.2 4.4 4.3  CL 108 109 108 107  CO2 19* 18* 20* 21*  GLUCOSE 117* 88 87 95  BUN 41* 33* 30* 31*  CREATININE 1.57* 1.21 1.16 1.07  CALCIUM 8.1* 7.8* 8.0* 7.9*  MG  --  2.2 2.1  --    GFR: Estimated Creatinine Clearance: 51.3 mL/min (by C-G formula based on SCr of 1.07 mg/dL). Liver  Function Tests: Recent Labs  Lab 01/22/20 1546 01/23/20 0632 01/24/20 0411 01/25/20 0253  AST 59* 41 42* 32  ALT 39 30 30 28   ALKPHOS 61 56 54 57  BILITOT 1.0 0.7 0.7 0.5  PROT 6.0* 6.1* 6.0* 5.4*  ALBUMIN 3.0* 2.9* 2.7* 2.4*   No results for input(s): LIPASE, AMYLASE in the last 168 hours. No results for input(s): AMMONIA in the last 168 hours. Coagulation Profile: No results for input(s): INR, PROTIME in the last 168 hours. Cardiac Enzymes: No results for input(s): CKTOTAL, CKMB, CKMBINDEX, TROPONINI in the last 168 hours. BNP (last 3 results) No results for input(s): PROBNP in the last 8760 hours. HbA1C: No results for input(s): HGBA1C in the last 72 hours. CBG: No results for input(s): GLUCAP in the last 168 hours. Lipid Profile: Recent Labs    01/22/20 1546  TRIG 80   Thyroid Function Tests: No results for input(s): TSH, T4TOTAL, FREET4, T3FREE, THYROIDAB in the last 72 hours. Anemia Panel: Recent Labs    01/24/20 0411 01/25/20 0253  FERRITIN 1,082* 857*   Urine analysis: No results found for: COLORURINE, APPEARANCEUR, LABSPEC, PHURINE, GLUCOSEU, HGBUR, BILIRUBINUR, KETONESUR, PROTEINUR, UROBILINOGEN, NITRITE, LEUKOCYTESUR Sepsis Labs: @LABRCNTIP (procalcitonin:4,lacticidven:4)  ) Recent  Results (from the past 240 hour(s))  Blood Culture (routine x 2)     Status: None (Preliminary result)   Collection Time: 01/22/20  3:49 PM   Specimen: BLOOD  Result Value Ref Range Status   Specimen Description BLOOD R FA  Final   Special Requests   Final    BOTTLES DRAWN AEROBIC AND ANAEROBIC Blood Culture adequate volume   Culture   Final    NO GROWTH 3 DAYS Performed at [facility], [address] [place], [place] [address]    Report Status PENDING  Incomplete  Blood Culture (routine x 2)     Status: None (Preliminary result)   Collection Time: 01/22/20  3:55 PM   Specimen: BLOOD  Result Value Ref Range Status   Specimen Description BLOOD LAC  Final   Special Requests   Final    BOTTLES DRAWN AEROBIC AND ANAEROBIC Blood Culture results may not be optimal due to an excessive volume of blood received in culture bottles   Culture   Final    NO GROWTH 3 DAYS Performed at [facility], [address] [place], [place] [address]    Report Status PENDING  Incomplete  Culture, sputum-assessment     Status: None   Collection Time: 01/24/20  2:22 AM   Specimen: Expectorated Sputum  Result Value Ref Range Status   Specimen Description EXPECTORATED SPUTUM  Final   Special Requests NONE  Final   Sputum evaluation   Final    THIS SPECIMEN IS ACCEPTABLE FOR SPUTUM CULTURE Performed at [facility], [place] [address] [place], [place] [address]    Report Status 01/24/2020 FINAL  Final  Culture, respiratory     Status: None (Preliminary result)   Collection Time: 01/24/20  2:22 AM  Result Value Ref Range Status   Specimen Description   Final    EXPECTORATED SPUTUM Performed at [place] [address], [place], [place] [address]    Special Requests   Final    NONE Reflexed from [id number] Performed at [facility], [place] [address], [place], [place] [address]    Gram Stain   Final    FEW WBC PRESENT,BOTH PMN  AND MONONUCLEAR MODERATE SQUAMOUS EPITHELIAL CELLS PRESENT MODERATE GRAM  POSITIVE COCCI IN PAIRS MODERATE GRAM POSITIVE RODS FEW GRAM NEGATIVE RODS RARE BUDDING YEAST SEEN Performed at [facility], [place] [address], [place], [place] [address]    Culture PENDING  Incomplete   Report Status PENDING  Incomplete         Radiology Studies: DG Chest Port 1 View  Result Date: 01/23/2020 CLINICAL DATA:  COVID positive patient with shortness of breath. EXAM: PORTABLE CHEST 1 VIEW COMPARISON:  Single-view of the chest 01/22/2020. PA and lateral chest 02/14/2019. FINDINGS: Right worse than left airspace disease is again seen and has somewhat worsened, particularly in the left mid and lower lung zones. The lungs are emphysematous. Heart size is normal. The patient is status post CABG. No pneumothorax or pleural effusion. IMPRESSION: Persistent multifocal pneumonia is worse on the right and shows progression on the left. Emphysema. Electronically Signed   By: Inge Rise M.D.   On: 01/23/2020 11:08        Scheduled Meds: . dexamethasone (DECADRON) injection  6 mg Intravenous Q24H  . ipratropium  2 puff Inhalation Q6H  . lisinopril  10 mg Oral Daily  . metoprolol tartrate  50 mg Oral BID  . mometasone-formoterol  2 puff Inhalation BID  . rosuvastatin  10 mg Oral q1800  . sodium chloride flush  3 mL Intravenous Q12H  . sodium chloride flush  3 mL Intravenous Q12H   Continuous Infusions: . sodium chloride    . azithromycin 500 mg (01/24/20 2039)  . cefTRIAXone (ROCEPHIN)  IV 2 g (01/24/20 1327)  . remdesivir 100 mg in NS 100 mL 100 mg (01/24/20 0912)     LOS: 1 day   The patient is critically ill with multiple organ systems failure and requires high complexity decision making for assessment and support, frequent evaluation and titration of therapies, application of advanced monitoring technologies and extensive interpretation of multiple databases. Critical Care Time devoted to  patient care services described in this note  Time spent: 40 minutes     Sherria Riemann, Geraldo Docker, MD [facility] Pager [phone]  If 7PM-7AM, please contact night-coverage [email or web address] Password [facility] 01/25/2020, 7:55 AM

## 2020-01-26 ENCOUNTER — Inpatient Hospital Stay (HOSPITAL_COMMUNITY): Payer: PPO

## 2020-01-26 DIAGNOSIS — N170 Acute kidney failure with tubular necrosis: Secondary | ICD-10-CM

## 2020-01-26 DIAGNOSIS — I82403 Acute embolism and thrombosis of unspecified deep veins of lower extremity, bilateral: Secondary | ICD-10-CM

## 2020-01-26 DIAGNOSIS — I82409 Acute embolism and thrombosis of unspecified deep veins of unspecified lower extremity: Secondary | ICD-10-CM | POA: Diagnosis not present

## 2020-01-26 DIAGNOSIS — R7989 Other specified abnormal findings of blood chemistry: Secondary | ICD-10-CM | POA: Insufficient documentation

## 2020-01-26 LAB — COMPREHENSIVE METABOLIC PANEL
ALT: 38 U/L (ref 0–44)
AST: 43 U/L — ABNORMAL HIGH (ref 15–41)
Albumin: 2.5 g/dL — ABNORMAL LOW (ref 3.5–5.0)
Alkaline Phosphatase: 65 U/L (ref 38–126)
Anion gap: 8 (ref 5–15)
BUN: 31 mg/dL — ABNORMAL HIGH (ref 8–23)
CO2: 22 mmol/L (ref 22–32)
Calcium: 7.9 mg/dL — ABNORMAL LOW (ref 8.9–10.3)
Chloride: 107 mmol/L (ref 98–111)
Creatinine, Ser: 1.08 mg/dL (ref 0.61–1.24)
GFR calc Af Amer: >60 mL/min (ref >60)
GFR calc non Af Amer: >60 mL/min (ref >60)
Glucose, Bld: 91 mg/dL (ref 70–99)
Potassium: 4 mmol/L (ref 3.5–5.1)
Sodium: 137 mmol/L (ref 135–145)
Total Bilirubin: 0.2 mg/dL — ABNORMAL LOW (ref 0.3–1.2)
Total Protein: 5.4 g/dL — ABNORMAL LOW (ref 6.5–8.1)

## 2020-01-26 LAB — C-REACTIVE PROTEIN: CRP: 7.5 mg/dL — ABNORMAL HIGH (ref <1.0)

## 2020-01-26 LAB — CBC WITH DIFFERENTIAL/PLATELET
Abs Immature Granulocytes: 0.15 10*3/uL — ABNORMAL HIGH (ref 0.00–0.07)
Basophils Absolute: 0 10*3/uL (ref 0.0–0.1)
Basophils Relative: 0 %
Eosinophils Absolute: 0 10*3/uL (ref 0.0–0.5)
Eosinophils Relative: 0 %
HCT: 40.5 % (ref 39.0–52.0)
Hemoglobin: 13.8 g/dL (ref 13.0–17.0)
Immature Granulocytes: 1 %
Lymphocytes Relative: 12 %
Lymphs Abs: 1.3 10*3/uL (ref 0.7–4.0)
MCH: 31.4 pg (ref 26.0–34.0)
MCHC: 34.1 g/dL (ref 30.0–36.0)
MCV: 92 fL (ref 80.0–100.0)
Monocytes Absolute: 0.7 10*3/uL (ref 0.1–1.0)
Monocytes Relative: 6 %
Neutro Abs: 9.4 10*3/uL — ABNORMAL HIGH (ref 1.7–7.7)
Neutrophils Relative %: 81 %
Platelets: 161 10*3/uL (ref 150–400)
RBC: 4.4 MIL/uL (ref 4.22–5.81)
RDW: 13.2 % (ref 11.5–15.5)
WBC: 11.6 10*3/uL — ABNORMAL HIGH (ref 4.0–10.5)
nRBC: 0 % (ref 0.0–0.2)

## 2020-01-26 LAB — HEPARIN LEVEL (UNFRACTIONATED): Heparin Unfractionated: 0.57 IU/mL (ref 0.30–0.70)

## 2020-01-26 LAB — CULTURE, RESPIRATORY W GRAM STAIN: Culture: NORMAL

## 2020-01-26 LAB — D-DIMER, QUANTITATIVE: D-Dimer, Quant: >20 ug/mL-FEU — ABNORMAL HIGH (ref 0.00–0.50)

## 2020-01-26 LAB — PROCALCITONIN: Procalcitonin: 0.17 ng/mL

## 2020-01-26 LAB — MAGNESIUM: Magnesium: 2.2 mg/dL (ref 1.7–2.4)

## 2020-01-26 LAB — FERRITIN: Ferritin: 626 ng/mL — ABNORMAL HIGH (ref 24–336)

## 2020-01-26 LAB — PHOSPHORUS: Phosphorus: 2.7 mg/dL (ref 2.5–4.6)

## 2020-01-26 MED ORDER — HEPARIN (PORCINE) 25000 UT/250ML-% IV SOLN
500.0000 [IU]/h | INTRAVENOUS | Status: AC
  Administered 2020-01-26: 13:00:00 1100 [IU]/h via INTRAVENOUS
  Administered 2020-01-27: 950 [IU]/h via INTRAVENOUS
  Administered 2020-01-29: 600 [IU]/h via INTRAVENOUS
  Filled 2020-01-26 (×4): qty 250

## 2020-01-26 MED ORDER — HYDRALAZINE HCL 20 MG/ML IJ SOLN: 5.0000 mg | INTRAMUSCULAR | Status: DC | PRN

## 2020-01-26 MED ORDER — HEPARIN BOLUS VIA INFUSION
2500.0000 [IU] | Freq: Once | INTRAVENOUS | Status: AC
  Administered 2020-01-26: 2500 [IU] via INTRAVENOUS
  Filled 2020-01-26: qty 2500

## 2020-01-26 MED ORDER — IOHEXOL 350 MG/ML SOLN
75.0000 mL | Freq: Once | INTRAVENOUS | Status: AC | PRN
  Administered 2020-01-26: 75 mL via INTRAVENOUS

## 2020-01-26 NOTE — Progress Notes (Signed)
[place] for Heparin level Indication: DVT  Allergies  Allergen Reactions  . Prednisone Itching  . Sulfa Antibiotics Other (See Comments)    Unknown    Patient Measurements: Height: 5\' 6"  (167.6 cm) Weight: 160 lb 4.4 oz (72.7 kg) IBW/kg (Calculated) : 63.8   Vital Signs: Temp: 98.2 F (36.8 C) (02/04 1217) Temp Source: Oral (02/04 1217) BP: 153/93 (02/04 1217) Pulse Rate: 68 (02/04 1217)  Labs: Recent Labs    01/24/20 0411 01/24/20 0411 01/25/20 0253 01/26/20 0407  HGB 13.8   < > 13.3 13.8  HCT 40.9  --  39.9 40.5  PLT 197  --  182 161  CREATININE 1.16  --  1.07 1.08   < > = values in this interval not displayed.    Estimated Creatinine Clearance: 50.9 mL/min (by C-G formula based on SCr of 1.08 mg/dL).  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

## 2020-01-26 NOTE — Progress Notes (Signed)
PT Cancellation Note  Patient Details Name: Anthony Morrison MRN: [id number] DOB: [birth year]   Cancelled Treatment:    Reason Eval/Treat Not Completed: Medical issues which prohibited therapy. 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 Will hold off on tx this pm and f/u tomorrow.  Horald Chestnut, PT    Delford Field 01/26/2020, 4:44 PM

## 2020-01-26 NOTE — Progress Notes (Addendum)
PROGRESS NOTE    Anthony Morrison  IPJ:[id number] DOB: 10/27/1941 DOA: 01/24/2020 PCP: Rusty Aus, MD   Brief Narrative:   79 y.o. WM PMHx IMA, CAD s/p CABG, CKD stage II-III, COPD  who was diagnosed with COVID-19 on 01/10/2020 was started on systemic steroids and antibiotics during this time as an outpatient progressively got worse came into the ED on 01/22/2020 and was home from the ED.  He continued to feel progressively worse with cough and shortness of breath.  He called EMS as he was significantly fatigued and short of breath was found to be febrile hypotensive and hypoxic and taken to [facility] ED in the ED he was found to be satting 80% on room air with a blood pressure of 60 and EKG showing an incomplete right bundle branch block left anterior fascicular block and sinus rhythm chest x-ray showed bilateral infiltrates with elevated creatinine leukocytosis and elevated procalcitonin as well as lactic acid.   Subjective: 2/4 afebrile overnight negative lower extremity pain, negative increased S OB.  Patient still concerned that if he starts anticoagulation will have severe hemorrhoid bleed.    Assessment & Plan:   Principal Problem:   Acute hypoxemic respiratory failure due to COVID-19 [facility]) Active Problems:   S/P CABG x 3   ASCVD (arteriosclerotic cardiovascular disease)   Benign essential HTN   Hyperlipidemia, mixed   Panlobular emphysema (HCC)   CAP (community acquired pneumonia)   Acute renal failure superimposed on stage 3 chronic kidney disease (HCC)   Elevated troponin   Sepsis (HCC)   COPD (chronic obstructive pulmonary disease) (HCC)   Pneumonia due to COVID-19 virus   Elevated d-dimer   Acute deep vein thrombosis (DVT) of lower extremity (HCC)   Covid pneumonia/acute respiratory failure with pneumonia COVID-19 Labs  Recent Labs    01/24/20 0411 01/25/20 0253 01/26/20 0407  DDIMER 2.86* 6.59* >20.00*  FERRITIN 1,082* 857* 626*  CRP 16.7* 12.9* 7.5*     Tested COVID-19 on 01/10/2020 (not in EMR)  -Decadron 6 mg daily -Remdesivir per pharmacy protocol -2/3 Actemra x1 dose -Vitamins per Covid protocol -Combivent -Dulera 200-5 mcg/ACT    CAP -Trend procalcitonin -Continue current antibiotics x5-day course    COPD -DC smoking 5 years ago -Dulera for home Advair -Not on home O2  Elevated D-dimer/ACUTE DVT lower extremity -Patient refuses anticoagulation, now patient's D-dimer> 2020 -2/4 bilateral lower extremity Doppler; positive for bilateral DVT see results below -2/4 CTA PE protocol; negative for PE see results below -2/4 patient has agreed to allow [place] to start heparin drip.  CAD s/p CABG January 2020 -TEE 1/24 EF 60-65% -Currently negative CP -Strict in and out -316ml -Daily weight Filed Weights   01/24/20 0200  Weight: 72.7 kg  -Lisinopril 10 mg daily -Metoprolol 50 mg BID -Hydralazine IV PRN SBP> 150 or DBP> 100  Essential HTN -See CAD  Anxiety -Ativan  Hx hemorrhoids -Per EMR Pt has been refusing aspirin and lovenox due to h/o bleeding hemorrhoids.  Dr. Posey Pronto explained to him that pathophysiology of covid and increased risk of clot/thrombosis.   Review of MAR PTA shows patient not on aspirin, Plavix      DVT prophylaxis: Heparin drip Code Status: Partial Family Communication: 2/4 Spoke [name] (son) counseled on plan of care, answered all questions Disposition Plan:    Consultants:    Procedures/Significant Events:  2/4 bilateral lower extremity Doppler; positive bilateral lower extremity DVT (prelim report) 2/4 CTA chest PE protocol; negative PE - Multifocal airspace and  ground-glass opacity, primarily within the dependent lower lobes. Most consistent with infection or aspiration. -Areas of nodularity, most significant at 9 mm in the left lower lobe. Cannot exclude concurrent primary bronchogenic carcinoma. - Small bilateral pleural effusions. -Aortic atherosclerosis (ICD10-I70.0) and emphysema  (ICD10-J43.9).    I have personally reviewed and interpreted all radiology studies and my findings are as above.  VENTILATOR SETTINGS: HFNC 2/4 Flow; 8 L/min SPO2; 90%   Cultures 2/2 respiratory culture pending 2/2 sputum pending 2/2 HIV screen negative 2/3 acute hepatitis panel negative   Antimicrobials: Anti-infectives (From admission, onward)   Start     Dose/Rate Stop   01/24/20 2100  azithromycin (ZITHROMAX) 500 mg in sodium chloride 0.9 % 250 mL IVPB    Note to Pharmacy: Started at [facility] but not sure time of last dose   500 mg 250 mL/hr over 60 Minutes 01/27/20 2059   01/24/20 1400  cefTRIAXone (ROCEPHIN) 2 g in sodium chloride 0.9 % 100 mL IVPB    Note to Pharmacy: Started at [facility] but not sure time of last dose   2 g 200 mL/hr over 30 Minutes 01/27/20 1359   01/24/20 1000  remdesivir 100 mg in sodium chloride 0.9 % 100 mL IVPB     100 mg 200 mL/hr over 30 Minutes 01/28/20 0959       Devices    LINES / TUBES:      Continuous Infusions: . sodium chloride    . azithromycin 500 mg (01/25/20 2100)  . cefTRIAXone (ROCEPHIN)  IV 2 g (01/25/20 1315)  . heparin    . remdesivir 100 mg in NS 100 mL 100 mg (01/26/20 0917)     Objective: Vitals:   01/26/20 0402 01/26/20 0804 01/26/20 0831 01/26/20 1217  BP: 134/83 (!) 157/82  (!) 153/93  Pulse: 73 90 71 68  Resp:  (!) 22  19  Temp: 98.2 F (36.8 C) 97.6 F (36.4 C)  98.2 F (36.8 C)  TempSrc: Oral Oral  Oral  SpO2: 92% 91% 90% 93%  Weight:      Height:        Intake/Output Summary (Last 24 hours) at 01/26/2020 1259 Last data filed at 01/26/2020 0228 Gross per 24 hour  Intake 240 ml  Output 400 ml  Net -160 ml   Filed Weights   01/24/20 0200  Weight: 72.7 kg   Physical Exam:  General: A/O x4, positive acute respiratory distress Eyes: negative scleral hemorrhage, negative anisocoria, negative icterus ENT: Negative Runny nose, negative gingival bleeding, Neck:  Negative scars, masses,  torticollis, lymphadenopathy, JVD Lungs: Tachypneic, decreased breath sounds bilaterally without wheezes or crackles Cardiovascular: Regular rate and rhythm without murmur gallop or rub normal S1 and S2 Abdomen: negative abdominal pain, nondistended, positive soft, bowel sounds, no rebound, no ascites, no appreciable mass Extremities: No significant cyanosis, clubbing, or edema bilateral lower extremities.  Negative swelling or pain to palpation. Skin: Negative rashes, lesions, ulcers Psychiatric:  Negative depression, negative anxiety, negative fatigue, negative mania  Central nervous system:  Cranial nerves II through XII intact, tongue/uvula midline, all extremities muscle strength 5/5, sensation intact throughout, negative dysarthria, negative expressive aphasia, negative receptive aphasia.     Data Reviewed: Care during the described time interval was provided by me .  I have reviewed this patient's available data, including medical history, events of note, physical examination, and all test results as part of my evaluation.   CBC: Recent Labs  Lab 01/22/20 1546 01/23/20 0093 01/24/20 0411 01/25/20 8182  01/26/20 0407  WBC 14.6* 13.2* 12.7* 9.8 11.6*  NEUTROABS 12.9* 11.5* 10.8* 8.1* 9.4*  HGB 14.0 13.8 13.8 13.3 13.8  HCT 41.4 42.3 40.9 39.9 40.5  MCV 91.2 94.2 92.5 93.0 92.0  PLT 210 184 197 182 638   Basic Metabolic Panel: Recent Labs  Lab 01/22/20 1546 01/23/20 0632 01/24/20 0411 01/25/20 0253 01/25/20 0806 01/26/20 0407  NA 137 137 137 136  --  137  K 4.1 4.2 4.4 4.3  --  4.0  CL 108 109 108 107  --  107  CO2 19* 18* 20* 21*  --  22  GLUCOSE 117* 88 87 95  --  91  BUN 41* 33* 30* 31*  --  31*  CREATININE 1.57* 1.21 1.16 1.07  --  1.08  CALCIUM 8.1* 7.8* 8.0* 7.9*  --  7.9*  MG  --  2.2 2.1  --  2.2 2.2  PHOS  --   --   --   --  3.1 2.7   GFR: Estimated Creatinine Clearance: 50.9 mL/min (by C-G formula based on SCr of 1.08 mg/dL). Liver Function  Tests: Recent Labs  Lab 01/22/20 1546 01/23/20 4536 01/24/20 0411 01/25/20 0253 01/26/20 0407  AST 59* 41 42* 32 43*  ALT 39 30 30 28  38  ALKPHOS 61 56 54 57 65  BILITOT 1.0 0.7 0.7 0.5 0.2*  PROT 6.0* 6.1* 6.0* 5.4* 5.4*  ALBUMIN 3.0* 2.9* 2.7* 2.4* 2.5*   No results for input(s): LIPASE, AMYLASE in the last 168 hours. No results for input(s): AMMONIA in the last 168 hours. Coagulation Profile: No results for input(s): INR, PROTIME in the last 168 hours. Cardiac Enzymes: No results for input(s): CKTOTAL, CKMB, CKMBINDEX, TROPONINI in the last 168 hours. BNP (last 3 results) No results for input(s): PROBNP in the last 8760 hours. HbA1C: No results for input(s): HGBA1C in the last 72 hours. CBG: No results for input(s): GLUCAP in the last 168 hours. Lipid Profile: No results for input(s): CHOL, HDL, LDLCALC, TRIG, CHOLHDL, LDLDIRECT in the last 72 hours. Thyroid Function Tests: No results for input(s): TSH, T4TOTAL, FREET4, T3FREE, THYROIDAB in the last 72 hours. Anemia Panel: Recent Labs    01/25/20 0253 01/26/20 0407  FERRITIN 857* 626*   Urine analysis: No results found for: COLORURINE, APPEARANCEUR, LABSPEC, PHURINE, GLUCOSEU, HGBUR, BILIRUBINUR, KETONESUR, PROTEINUR, UROBILINOGEN, NITRITE, LEUKOCYTESUR Sepsis Labs: @LABRCNTIP (procalcitonin:4,lacticidven:4)  ) Recent Results (from the past 240 hour(s))  Blood Culture (routine x 2)     Status: None (Preliminary result)   Collection Time: 01/22/20  3:49 PM   Specimen: BLOOD  Result Value Ref Range Status   Specimen Description BLOOD R FA  Final   Special Requests   Final    BOTTLES DRAWN AEROBIC AND ANAEROBIC Blood Culture adequate volume   Culture   Final    NO GROWTH 4 DAYS Performed at [facility], [address], [place], [place] [address]    Report Status PENDING  Incomplete  Blood Culture (routine x 2)     Status: None (Preliminary result)   Collection Time: 01/22/20  3:55 PM   Specimen:  BLOOD  Result Value Ref Range Status   Specimen Description BLOOD LAC  Final   Special Requests   Final    BOTTLES DRAWN AEROBIC AND ANAEROBIC Blood Culture results may not be optimal due to an excessive volume of blood received in culture bottles   Culture   Final    NO GROWTH 4 DAYS Performed at  [facility], [address], [place], [place] [address]    Report Status PENDING  Incomplete  Culture, sputum-assessment     Status: None   Collection Time: 01/24/20  2:22 AM   Specimen: Expectorated Sputum  Result Value Ref Range Status   Specimen Description EXPECTORATED SPUTUM  Final   Special Requests NONE  Final   Sputum evaluation   Final    THIS SPECIMEN IS ACCEPTABLE FOR SPUTUM CULTURE Performed at [facility] [place] [address] [place], [place] [address]    Report Status 01/24/2020 FINAL  Final  Culture, respiratory     Status: None   Collection Time: 01/24/20  2:22 AM  Result Value Ref Range Status   Specimen Description   Final    EXPECTORATED SPUTUM Performed at [place] [address] [place], [place] [address]    Special Requests   Final    NONE Reflexed from [phone] Performed at [facility], [place] [address], [place], [place] [address]    Gram Stain   Final    FEW WBC PRESENT,BOTH PMN AND MONONUCLEAR MODERATE SQUAMOUS EPITHELIAL CELLS PRESENT MODERATE GRAM POSITIVE COCCI IN PAIRS MODERATE GRAM POSITIVE RODS FEW GRAM NEGATIVE RODS RARE BUDDING YEAST SEEN    Culture   Final    FEW Consistent with normal respiratory flora. Performed at [facility], [place] [address], [place], [place] [address]    Report Status 01/26/2020 FINAL  Final         Radiology Studies: CT ANGIO CHEST PE W OR WO CONTRAST  Result Date: 01/26/2020 CLINICAL DATA:  High probability for pulmonary embolism. Elevated D-dimer. EXAM: CT ANGIOGRAPHY CHEST WITH CONTRAST TECHNIQUE: Multidetector CT imaging of the chest was  performed using the standard protocol during bolus administration of intravenous contrast. Multiplanar CT image reconstructions and MIPs were obtained to evaluate the vascular anatomy. CONTRAST:  9mL OMNIPAQUE IOHEXOL 350 MG/ML SOLN COMPARISON:  Chest radiographs, most recent 01/23/2020. No prior CT. FINDINGS: Cardiovascular: The quality of this exam for evaluation of pulmonary embolism is good. No pulmonary embolism. Advanced aortic and branch vessel atherosclerosis. No aortic dissection. Tortuous thoracic aorta. Mild cardiomegaly. Median sternotomy for prior CABG. No pericardial effusion. Mediastinum/Nodes: Prominent nodal tissue within the azygoesophageal recess is likely reactive and not pathologic by size criteria. No hilar adenopathy. Lungs/Pleura: Small bilateral pleural effusions. 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 Dependent right greater than left lower lobe and less so upper lobe airspace and ground-glass opacities. Areas of nodularity, including within the left lower lobe at 9 mm on 121/7 and adjacent to a presumed infected bulla at 7 mm in the left lower lobe on 128/7. Upper Abdomen: Focal steatosis adjacent the falciform ligament. Normal imaged portions of the spleen, stomach, pancreas, adrenal glands, left kidney. Right renal low-density lesions of maximally 1.3 cm are fluid density, consistent with cysts. Musculoskeletal: No acute osseous abnormality. Review of the MIP images confirms the above findings. IMPRESSION: 1.  No evidence of pulmonary embolism. 2. Multifocal airspace and ground-glass opacity, primarily within the dependent lower lobes. Most consistent with infection or aspiration. 3. Areas of nodularity, most significant at 9 mm in the left lower lobe. Cannot exclude concurrent primary bronchogenic carcinoma. Recommend appropriate antibiotic therapy and CT follow-up at 2-3 months. 4. Small bilateral pleural effusions. 5. Aortic atherosclerosis (ICD10-I70.0) and emphysema  (ICD10-J43.9). Electronically Signed   By: Abigail Miyamoto M.D.   On: 01/26/2020 12:04   VAS [place] LOWER EXTREMITY VENOUS (DVT)  Result Date: 01/26/2020  Lower Venous DVTStudy Indications: D-dimer >20, not on anticoagulation, COVID-19.  Comparison Study: 01/22/2020 negative bilateral lower extremity venous duplex. Performing Technologist: Maudry Mayhew MHA, RDMS, RVT, RDCS  Examination Guidelines: A complete evaluation includes B-mode imaging, spectral Doppler, color Doppler, and power Doppler as needed of all accessible portions of each vessel. Bilateral testing is considered an integral part of a complete examination. Limited examinations for reoccurring indications may be performed as noted. The reflux portion of the exam is performed with the patient in reverse Trendelenburg.  +---------+---------------+---------+-----------+----------+--------------+ RIGHT    CompressibilityPhasicitySpontaneityPropertiesThrombus Aging +---------+---------------+---------+-----------+----------+--------------+ CFV      Full           Yes      Yes                                 +---------+---------------+---------+-----------+----------+--------------+ SFJ      Full                                                        +---------+---------------+---------+-----------+----------+--------------+ FV Prox  Full                                                        +---------+---------------+---------+-----------+----------+--------------+ FV Mid   Full                                                        +---------+---------------+---------+-----------+----------+--------------+ FV DistalFull                                                        +---------+---------------+---------+-----------+----------+--------------+ PFV      Full                                                        +---------+---------------+---------+-----------+----------+--------------+ POP      Full            Yes      Yes                                 +---------+---------------+---------+-----------+----------+--------------+ PTV      None                    No                   Acute          +---------+---------------+---------+-----------+----------+--------------+   Right Technical Findings: Not visualized segments include Right peroneal veins.  +---------+---------------+---------+-----------+----------+--------------+ LEFT     CompressibilityPhasicitySpontaneityPropertiesThrombus Aging +---------+---------------+---------+-----------+----------+--------------+ CFV  Full           Yes      Yes                                 +---------+---------------+---------+-----------+----------+--------------+ SFJ      Full                                                        +---------+---------------+---------+-----------+----------+--------------+ FV Prox  Full                                                        +---------+---------------+---------+-----------+----------+--------------+ FV Mid   Full                                                        +---------+---------------+---------+-----------+----------+--------------+ FV DistalFull                                                        +---------+---------------+---------+-----------+----------+--------------+ PFV      Full                                                        +---------+---------------+---------+-----------+----------+--------------+ POP      Full           Yes      Yes                                 +---------+---------------+---------+-----------+----------+--------------+ PTV                              No                   Acute          +---------+---------------+---------+-----------+----------+--------------+ PERO                             No                   Acute           +---------+---------------+---------+-----------+----------+--------------+ Inadequate visualization in transverse plane to perform compression maneuvers.    Summary: RIGHT: - Findings consistent with acute deep vein thrombosis involving the right posterior tibial veins. - No cystic structure found in the popliteal fossa.  LEFT: - Findings consistent with acute deep vein thrombosis involving the left posterior tibial veins, and left peroneal veins. - No cystic structure found in the popliteal fossa.  *  See table(s) above for measurements and observations.    Preliminary         Scheduled Meds: . vitamin C  500 mg Oral Daily  . dexamethasone (DECADRON) injection  6 mg Intravenous Q24H  . heparin  2,500 Units Intravenous Once  . Ipratropium-Albuterol  1 puff Inhalation QID  . lisinopril  10 mg Oral Daily  . metoprolol tartrate  50 mg Oral BID  . mometasone-formoterol  2 puff Inhalation BID  . rosuvastatin  10 mg Oral q1800  . sodium chloride flush  3 mL Intravenous Q12H  . sodium chloride flush  3 mL Intravenous Q12H  . zinc sulfate  220 mg Oral Daily   Continuous Infusions: . sodium chloride    . azithromycin 500 mg (01/25/20 2100)  . cefTRIAXone (ROCEPHIN)  IV 2 g (01/25/20 1315)  . heparin    . remdesivir 100 mg in NS 100 mL 100 mg (01/26/20 0917)     LOS: 2 days   The patient is critically ill with multiple organ systems failure and requires high complexity decision making for assessment and support, frequent evaluation and titration of therapies, application of advanced monitoring technologies and extensive interpretation of multiple databases. Critical Care Time devoted to patient care services described in this note  Time spent: 40 minutes     Dorlene Footman, Geraldo Docker, MD [facility] Pager [phone]  If 7PM-7AM, please contact night-coverage [email or web address] Password [facility] 01/26/2020, 12:59 PM

## 2020-01-26 NOTE — Progress Notes (Signed)
MD text paged. Patient with complaints of N&T in the left middle, ring, and pinky fingers. Patient states this started yesterday and seems worse this morning.

## 2020-01-26 NOTE — Progress Notes (Signed)
Bilateral lower extremity venous duplex completed. Refer to "CV Proc" under chart review to view preliminary results.  Critical results given to Dr. Sherral Hammers via secure chat.  01/26/2020 12:07 PM Kelby Aline., MHA, RVT, RDCS, RDMS

## 2020-01-26 NOTE — Progress Notes (Signed)
OT Cancellation Note  Patient Details Name: Anthony Morrison MRN: [id number] DOB: 04-26-1941   Cancelled Treatment:    Reason Eval/Treat Not Completed: Medical issues which prohibited therapy;Patient not medically ready. Nursing and MD requesting hold today due to significant increase in d-dimer this morning.  Wait until after results of scheduled doppler this afternoon.  Will attempt again tomorrow pending results.  [name], OTR/L    [name] 01/26/2020, 4:10 PM

## 2020-01-26 NOTE — Progress Notes (Signed)
[facility] for Heparin level Indication: DVT  Allergies  Allergen Reactions  . Prednisone Itching  . Sulfa Antibiotics Other (See Comments)    Unknown    Patient Measurements: Height: 5\' 6"  (167.6 cm) Weight: 160 lb 4.4 oz (72.7 kg) IBW/kg (Calculated) : 63.8   Vital Signs: Temp: 98 F (36.7 C) (02/04 1954) Temp Source: Oral (02/04 1954) BP: 119/92 (02/04 1954) Pulse Rate: 77 (02/04 1954)  Labs: Recent Labs    01/24/20 0411 01/24/20 0411 01/25/20 0253 01/26/20 0407 01/26/20 1954  HGB 13.8   < > 13.3 13.8  --   HCT 40.9  --  39.9 40.5  --   PLT 197  --  182 161  --   HEPARINUNFRC  --   --   --   --  0.57  CREATININE 1.16  --  1.07 1.08  --    < > = values in this interval not displayed.    Estimated Creatinine Clearance: 50.9 mL/min (by C-G formula based on SCr of 1.08 mg/dL).  Assessment: 79 year old male with COVID and bilateral DVTs to begin heparin  History of severe hemorrhoid bleed  His heparin level came back at 0.57. Since the hemorrhoids bleed was in the past, we will keep the rate the same for now and keep an eye on it. Rn said there is no bleeding now.   Goal of Therapy:  Heparin level 0.3-0.5 units/ml Monitor platelets by anticoagulation protocol: Yes   Plan:  Continue heparin drip at 1100 units / hr Daily heparin level, CBC   Onnie Boer, PharmD, BCIDP, AAHIVP, CPP Infectious Disease Pharmacist 01/26/2020 9:17 PM

## 2020-01-26 NOTE — Progress Notes (Addendum)
2115 Call to pharmacy to confirm heparin rate, pharmacy stated to keep it at the rate 11 it is going at and they will recheck in the morning.   [phone] Call from pharmacy to change rate to 9.5 on heparin drip. Orders completed

## 2020-01-27 LAB — CBC WITH DIFFERENTIAL/PLATELET
Abs Immature Granulocytes: 0.14 10*3/uL — ABNORMAL HIGH (ref 0.00–0.07)
Basophils Absolute: 0 10*3/uL (ref 0.0–0.1)
Basophils Relative: 0 %
Eosinophils Absolute: 0 10*3/uL (ref 0.0–0.5)
Eosinophils Relative: 0 %
HCT: 42 % (ref 39.0–52.0)
Hemoglobin: 14.2 g/dL (ref 13.0–17.0)
Immature Granulocytes: 1 %
Lymphocytes Relative: 12 %
Lymphs Abs: 1.5 10*3/uL (ref 0.7–4.0)
MCH: 31.2 pg (ref 26.0–34.0)
MCHC: 33.8 g/dL (ref 30.0–36.0)
MCV: 92.3 fL (ref 80.0–100.0)
Monocytes Absolute: 0.8 10*3/uL (ref 0.1–1.0)
Monocytes Relative: 6 %
Neutro Abs: 10.1 10*3/uL — ABNORMAL HIGH (ref 1.7–7.7)
Neutrophils Relative %: 81 %
Platelets: 156 10*3/uL (ref 150–400)
RBC: 4.55 MIL/uL (ref 4.22–5.81)
RDW: 13.2 % (ref 11.5–15.5)
WBC: 12.5 10*3/uL — ABNORMAL HIGH (ref 4.0–10.5)
nRBC: 0 % (ref 0.0–0.2)

## 2020-01-27 LAB — COMPREHENSIVE METABOLIC PANEL
ALT: 70 U/L — ABNORMAL HIGH (ref 0–44)
AST: 70 U/L — ABNORMAL HIGH (ref 15–41)
Albumin: 2.7 g/dL — ABNORMAL LOW (ref 3.5–5.0)
Alkaline Phosphatase: 73 U/L (ref 38–126)
Anion gap: 8 (ref 5–15)
BUN: 30 mg/dL — ABNORMAL HIGH (ref 8–23)
CO2: 22 mmol/L (ref 22–32)
Calcium: 7.9 mg/dL — ABNORMAL LOW (ref 8.9–10.3)
Chloride: 110 mmol/L (ref 98–111)
Creatinine, Ser: 1.17 mg/dL (ref 0.61–1.24)
GFR calc Af Amer: >60 mL/min (ref >60)
GFR calc non Af Amer: 59 mL/min — ABNORMAL LOW (ref >60)
Glucose, Bld: 99 mg/dL (ref 70–99)
Potassium: 4 mmol/L (ref 3.5–5.1)
Sodium: 140 mmol/L (ref 135–145)
Total Bilirubin: 0.6 mg/dL (ref 0.3–1.2)
Total Protein: 5.5 g/dL — ABNORMAL LOW (ref 6.5–8.1)

## 2020-01-27 LAB — FERRITIN: Ferritin: 693 ng/mL — ABNORMAL HIGH (ref 24–336)

## 2020-01-27 LAB — CULTURE, BLOOD (ROUTINE X 2)
Culture: NO GROWTH
Culture: NO GROWTH
Special Requests: ADEQUATE

## 2020-01-27 LAB — PROCALCITONIN: Procalcitonin: 0.11 ng/mL

## 2020-01-27 LAB — D-DIMER, QUANTITATIVE: D-Dimer, Quant: >20 ug/mL-FEU — ABNORMAL HIGH (ref 0.00–0.50)

## 2020-01-27 LAB — HEPARIN LEVEL (UNFRACTIONATED)
Heparin Unfractionated: 0.82 IU/mL — ABNORMAL HIGH (ref 0.30–0.70)
Heparin Unfractionated: 0.71 IU/mL — ABNORMAL HIGH (ref 0.30–0.70)
Heparin Unfractionated: 0.32 IU/mL (ref 0.30–0.70)

## 2020-01-27 LAB — PHOSPHORUS: Phosphorus: 3.1 mg/dL (ref 2.5–4.6)

## 2020-01-27 LAB — C-REACTIVE PROTEIN: CRP: 4.6 mg/dL — ABNORMAL HIGH (ref <1.0)

## 2020-01-27 LAB — MAGNESIUM: Magnesium: 2.3 mg/dL (ref 1.7–2.4)

## 2020-01-27 NOTE — Progress Notes (Addendum)
[facility] NOTE  Pharmacy Consult for Heparin  Indication: DVT   Assessment: 79 year old male with COVID and bilateral DVTs to begin heparin. History of severe hemorrhoid bleed  Heparin level 0.32 units/ml  Goal of Therapy:  Heparin level 0.3-0.5 units/ml Monitor platelets by anticoagulation protocol: Yes   Plan:  Continue heparin drip at 850 units / hr Will f/u am labs  Thanks for allowing pharmacy to be a part of this patient's care.  Excell Seltzer, PharmD Clinical Pharmacist   Addum:  Heparin level 0.49 units/ml.  Continue heparin at 850 units/hr

## 2020-01-27 NOTE — Progress Notes (Signed)
OT Cancellation Note  Patient Details Name: Anthony Morrison MRN: [id number] DOB: 1941/09/12   Cancelled Treatment:    Reason Eval/Treat Not Completed: Medical issues which prohibited therapy;Patient not medically ready. MD requesting to hold this patient due to recent DVT and elevated d-dimer. Will follow up tomorrow.  [name], OTR/L   [name] 01/27/2020, 4:26 PM

## 2020-01-27 NOTE — Progress Notes (Signed)
[place] for Heparin  Indication: DVT  Allergies  Allergen Reactions  . Prednisone Itching  . Sulfa Antibiotics Other (See Comments)    Unknown    Patient Measurements: Height: 5\' 6"  (167.6 cm) Weight: 160 lb 4.4 oz (72.7 kg) IBW/kg (Calculated) : 63.8   Vital Signs: Temp: 98 F (36.7 C) (02/05 0012) Temp Source: Oral (02/05 0012) BP: 149/76 (02/05 0012) Pulse Rate: 75 (02/05 0012)  Labs: Recent Labs    01/25/20 0253 01/25/20 0253 01/26/20 0407 01/26/20 1954 01/27/20 0150  HGB 13.3   < > 13.8  --  14.2  HCT 39.9  --  40.5  --  42.0  PLT 182  --  161  --  156  HEPARINUNFRC  --   --   --  0.57 0.82*  CREATININE 1.07  --  1.08  --  1.17   < > = values in this interval not displayed.    Estimated Creatinine Clearance: 47 mL/min (by C-G formula based on SCr of 1.17 mg/dL).  Assessment: 79 year old male with COVID and bilateral DVTs to begin heparin. History of severe hemorrhoid bleed  Heparin level 0.82 (supratherapeutic) on gtt at 1100 units/hr. No bleeding noted.   Goal of Therapy:  Heparin level 0.3-0.5 units/ml Monitor platelets by anticoagulation protocol: Yes   Plan:  Decrease heparin drip to 950 units / hr Will f/u 8 hr heparin level  Sherlon Handing, PharmD, BCPS Please see amion for complete clinical pharmacist phone list 01/27/2020 4:34 AM

## 2020-01-27 NOTE — Progress Notes (Signed)
PROGRESS NOTE    Anthony Morrison  NUU:[id number] DOB: 18-Apr-1941 DOA: 01/24/2020 PCP: Rusty Aus, MD   Brief Narrative:   79 y.o. WM PMHx IMA, CAD s/p CABG, CKD stage II-III, COPD  who was diagnosed with COVID-19 on 01/10/2020 was started on systemic steroids and antibiotics during this time as an outpatient progressively got worse came into the ED on 01/22/2020 and was home from the ED.  He continued to feel progressively worse with cough and shortness of breath.  He called EMS as he was significantly fatigued and short of breath was found to be febrile hypotensive and hypoxic and taken to [facility] ED in the ED he was found to be satting 80% on room air with a blood pressure of 60 and EKG showing an incomplete right bundle branch block left anterior fascicular block and sinus rhythm chest x-ray showed bilateral infiltrates with elevated creatinine leukocytosis and elevated procalcitonin as well as lactic acid.   Subjective: 2/5 afebrile overnight, states since removal of the IV in his right arm feeling has begun to return and lateral 3 fingers.  No episodes of bleeding.   Assessment & Plan:   Principal Problem:   Acute hypoxemic respiratory failure due to COVID-19 [facility]) Active Problems:   S/P CABG x 3   ASCVD (arteriosclerotic cardiovascular disease)   Benign essential HTN   Hyperlipidemia, mixed   Panlobular emphysema (HCC)   CAP (community acquired pneumonia)   Acute renal failure superimposed on stage 3 chronic kidney disease (HCC)   Elevated troponin   Sepsis (HCC)   COPD (chronic obstructive pulmonary disease) (HCC)   Pneumonia due to COVID-19 virus   Elevated d-dimer   Acute deep vein thrombosis (DVT) of lower extremity (HCC)   Covid pneumonia/acute respiratory failure with pneumonia COVID-19 Labs  Recent Labs    01/25/20 0253 01/26/20 0407 01/27/20 0150  DDIMER 6.59* >20.00* >20.00*  FERRITIN 857* 626* 693*  CRP 12.9* 7.5* 4.6*    Tested COVID-19 on  01/10/2020 (not in EMR)  -Decadron 6 mg daily -Remdesivir per pharmacy protocol -2/3 Actemra x1 dose -Vitamins per Covid protocol -Combivent -Dulera 200-5 mcg/ACT    CAP -Trend procalcitonin Results for CATALDO, COSGRIFF (MRN [id number]) as of 01/27/2020 10:25  Ref. Range 01/24/2020 04:11 01/25/2020 02:53 01/25/2020 08:06 01/26/2020 04:07 01/27/2020 01:50  Procalcitonin Latest Units: ng/mL 0.83 0.44 0.31 0.17 0.11  -Continue current antibiotics x5-day course    COPD -DC smoking 5 years ago -Dulera for home Advair -Not on home O2  Elevated D-dimer/ACUTE DVT lower extremity -Patient refuses anticoagulation, now patient's D-dimer> 2020 -2/4 bilateral lower extremity Doppler; positive for bilateral DVT see results below -2/4 CTA PE protocol; negative for PE see results below -2/4 patient has agreed to allow [place] to start heparin drip.  CAD s/p CABG January 2020 -TEE 1/24 EF 60-65% -Currently negative CP -Strict in and out -470 ml -Daily weight Filed Weights   01/24/20 0200  Weight: 72.7 kg  -Lisinopril 10 mg daily -Metoprolol 50 mg BID -Hydralazine IV PRN SBP> 150 or DBP> 100  Essential HTN -See CAD  Anxiety -Ativan  Hx hemorrhoids -Per EMR Pt has been refusing aspirin and lovenox due to h/o bleeding hemorrhoids.  Dr. Posey Pronto explained to him that pathophysiology of covid and increased risk of clot/thrombosis.   Review of MAR PTA shows patient not on aspirin, Plavix      DVT prophylaxis: Heparin drip Code Status: Partial Family Communication: 2/4 Vassie Loll (son) counseled on plan of care, answered  all questions Disposition Plan:    Consultants:    Procedures/Significant Events:  2/4 bilateral lower extremity Doppler; positive bilateral lower extremity DVT (prelim report) 2/4 CTA chest PE protocol; negative PE - Multifocal airspace and ground-glass opacity, primarily within the dependent lower lobes. Most consistent with infection or aspiration. -Areas of nodularity, most  significant at 9 mm in the left lower lobe. Cannot exclude concurrent primary bronchogenic carcinoma. - Small bilateral pleural effusions. -Aortic atherosclerosis (ICD10-I70.0) and emphysema (ICD10-J43.9).    I have personally reviewed and interpreted all radiology studies and my findings are as above.  VENTILATOR SETTINGS: HFNC 2/5 Flow; 11 L/min SPO2; 87%    Cultures 2/2 respiratory culture pending 2/2 sputum consistent with normal respiratory flora 2/2 HIV screen negative 2/3 acute hepatitis panel negative    Antimicrobials: Anti-infectives (From admission, onward)   Start     Dose/Rate Stop   01/24/20 2100  azithromycin (ZITHROMAX) 500 mg in sodium chloride 0.9 % 250 mL IVPB    Note to Pharmacy: Started at [facility] but not sure time of last dose   500 mg 250 mL/hr over 60 Minutes 01/27/20 2059   01/24/20 1400  cefTRIAXone (ROCEPHIN) 2 g in sodium chloride 0.9 % 100 mL IVPB    Note to Pharmacy: Started at [facility] but not sure time of last dose   2 g 200 mL/hr over 30 Minutes 01/27/20 1359   01/24/20 1000  remdesivir 100 mg in sodium chloride 0.9 % 100 mL IVPB     100 mg 200 mL/hr over 30 Minutes 01/28/20 0959       Devices    LINES / TUBES:      Continuous Infusions:  sodium chloride     heparin 950 Units/hr (01/27/20 0624)   remdesivir 100 mg in NS 100 mL 100 mg (01/26/20 0917)     Objective: Vitals:   01/26/20 1954 01/27/20 0012 01/27/20 0439 01/27/20 0758  BP: (!) 119/92 (!) 149/76 (!) 150/91 (!) 145/67  Pulse: 77 75 86 81  Resp: 19 20 17  (!) 22  Temp: 98 F (36.7 C) 98 F (36.7 C) 97.9 F (36.6 C) 97.7 F (36.5 C)  TempSrc: Oral Oral Oral Oral  SpO2: 96% 97% (!) 88% 91%  Weight:      Height:        Intake/Output Summary (Last 24 hours) at 01/27/2020 5465 Last data filed at 01/27/2020 0500 Gross per 24 hour  Intake --  Output 240 ml  Net -240 ml   Filed Weights   01/24/20 0200  Weight: 72.7 kg    Physical Exam:  General: A/O x4,  positive acute respiratory distress Eyes: negative scleral hemorrhage, negative anisocoria, negative icterus ENT: Negative Runny nose, negative gingival bleeding, Neck:  Negative scars, masses, torticollis, lymphadenopathy, JVD Lungs: Decreased breath sounds bilaterally without wheezes or crackles Cardiovascular: Regular rate and rhythm without murmur gallop or rub normal S1 and S2 Abdomen: negative abdominal pain, nondistended, positive soft, bowel sounds, no rebound, no ascites, no appreciable mass Extremities: No significant cyanosis, clubbing, or edema bilateral lower extremities Skin: Negative rashes, lesions, ulcers Psychiatric:  Negative depression, negative anxiety, negative fatigue, negative mania  Central nervous system:  Cranial nerves II through XII intact, tongue/uvula midline, all extremities muscle strength 5/5, sensation intact throughout, negative dysarthria, negative expressive aphasia, negative receptive aphasia.      Data Reviewed: Care during the described time interval was provided by me .  I have reviewed this patient's available data, including medical history, events of  note, physical examination, and all test results as part of my evaluation.   CBC: Recent Labs  Lab 01/23/20 0632 01/24/20 0411 01/25/20 0253 01/26/20 0407 01/27/20 0150  WBC 13.2* 12.7* 9.8 11.6* 12.5*  NEUTROABS 11.5* 10.8* 8.1* 9.4* 10.1*  HGB 13.8 13.8 13.3 13.8 14.2  HCT 42.3 40.9 39.9 40.5 42.0  MCV 94.2 92.5 93.0 92.0 92.3  PLT 184 197 182 161 638   Basic Metabolic Panel: Recent Labs  Lab 01/23/20 0632 01/24/20 0411 01/25/20 0253 01/25/20 0806 01/26/20 0407 01/27/20 0150  NA 137 137 136  --  137 140  K 4.2 4.4 4.3  --  4.0 4.0  CL 109 108 107  --  107 110  CO2 18* 20* 21*  --  22 22  GLUCOSE 88 87 95  --  91 99  BUN 33* 30* 31*  --  31* 30*  CREATININE 1.21 1.16 1.07  --  1.08 1.17  CALCIUM 7.8* 8.0* 7.9*  --  7.9* 7.9*  MG 2.2 2.1  --  2.2 2.2 2.3  PHOS  --   --   --   3.1 2.7 3.1   GFR: Estimated Creatinine Clearance: 47 mL/min (by C-G formula based on SCr of 1.17 mg/dL). Liver Function Tests: Recent Labs  Lab 01/23/20 4665 01/24/20 0411 01/25/20 0253 01/26/20 0407 01/27/20 0150  AST 41 42* 32 43* 70*  ALT 30 30 28  38 70*  ALKPHOS 56 54 57 65 73  BILITOT 0.7 0.7 0.5 0.2* 0.6  PROT 6.1* 6.0* 5.4* 5.4* 5.5*  ALBUMIN 2.9* 2.7* 2.4* 2.5* 2.7*   No results for input(s): LIPASE, AMYLASE in the last 168 hours. No results for input(s): AMMONIA in the last 168 hours. Coagulation Profile: No results for input(s): INR, PROTIME in the last 168 hours. Cardiac Enzymes: No results for input(s): CKTOTAL, CKMB, CKMBINDEX, TROPONINI in the last 168 hours. BNP (last 3 results) No results for input(s): PROBNP in the last 8760 hours. HbA1C: No results for input(s): HGBA1C in the last 72 hours. CBG: No results for input(s): GLUCAP in the last 168 hours. Lipid Profile: No results for input(s): CHOL, HDL, LDLCALC, TRIG, CHOLHDL, LDLDIRECT in the last 72 hours. Thyroid Function Tests: No results for input(s): TSH, T4TOTAL, FREET4, T3FREE, THYROIDAB in the last 72 hours. Anemia Panel: Recent Labs    01/26/20 0407 01/27/20 0150  FERRITIN 626* 693*   Urine analysis: No results found for: COLORURINE, APPEARANCEUR, LABSPEC, PHURINE, GLUCOSEU, HGBUR, BILIRUBINUR, KETONESUR, PROTEINUR, UROBILINOGEN, NITRITE, LEUKOCYTESUR Sepsis Labs: @LABRCNTIP (procalcitonin:4,lacticidven:4)  ) Recent Results (from the past 240 hour(s))  Blood Culture (routine x 2)     Status: None   Collection Time: 01/22/20  3:49 PM   Specimen: BLOOD  Result Value Ref Range Status   Specimen Description BLOOD R FA  Final   Special Requests   Final    BOTTLES DRAWN AEROBIC AND ANAEROBIC Blood Culture adequate volume   Culture   Final    NO GROWTH 5 DAYS Performed at [facility], [address] [place], [place] [address]    Report Status 01/27/2020 FINAL  Final  Blood  Culture (routine x 2)     Status: None   Collection Time: 01/22/20  3:55 PM   Specimen: BLOOD  Result Value Ref Range Status   Specimen Description BLOOD LAC  Final   Special Requests   Final    BOTTLES DRAWN AEROBIC AND ANAEROBIC Blood Culture results may not be optimal due to an excessive volume of  blood received in culture bottles   Culture   Final    NO GROWTH 5 DAYS Performed at [facility], [address] [place], [place] [address]    Report Status 01/27/2020 FINAL  Final  Culture, sputum-assessment     Status: None   Collection Time: 01/24/20  2:22 AM   Specimen: Expectorated Sputum  Result Value Ref Range Status   Specimen Description EXPECTORATED SPUTUM  Final   Special Requests NONE  Final   Sputum evaluation   Final    THIS SPECIMEN IS ACCEPTABLE FOR SPUTUM CULTURE Performed at [facility], [place] [address] [place], [place] [address]    Report Status 01/24/2020 FINAL  Final  Culture, respiratory     Status: None   Collection Time: 01/24/20  2:22 AM  Result Value Ref Range Status   Specimen Description   Final    EXPECTORATED SPUTUM Performed at [place] [address], [place], [place] [address]    Special Requests   Final    NONE Reflexed from [id number] Performed at [facility], [place] [address] [place], [place] [address]    Gram Stain   Final    FEW WBC PRESENT,BOTH PMN AND MONONUCLEAR MODERATE SQUAMOUS EPITHELIAL CELLS PRESENT MODERATE GRAM POSITIVE COCCI IN PAIRS MODERATE GRAM POSITIVE RODS FEW GRAM NEGATIVE RODS RARE BUDDING YEAST SEEN    Culture   Final    FEW Consistent with normal respiratory flora. Performed at [facility], [place] [address], [place], [place] [address]    Report Status 01/26/2020 FINAL  Final         Radiology Studies: CT ANGIO CHEST PE W OR WO CONTRAST  Result Date: 01/26/2020 CLINICAL DATA:  High probability for pulmonary embolism. Elevated D-dimer.  EXAM: CT ANGIOGRAPHY CHEST WITH CONTRAST TECHNIQUE: Multidetector CT imaging of the chest was performed using the standard protocol during bolus administration of intravenous contrast. Multiplanar CT image reconstructions and MIPs were obtained to evaluate the vascular anatomy. CONTRAST:  40mL OMNIPAQUE IOHEXOL 350 MG/ML SOLN COMPARISON:  Chest radiographs, most recent 01/23/2020. No prior CT. FINDINGS: Cardiovascular: The quality of this exam for evaluation of pulmonary embolism is good. No pulmonary embolism. Advanced aortic and branch vessel atherosclerosis. No aortic dissection. Tortuous thoracic aorta. Mild cardiomegaly. Median sternotomy for prior CABG. No pericardial effusion. Mediastinum/Nodes: Prominent nodal tissue within the azygoesophageal recess is likely reactive and not pathologic by size criteria. No hilar adenopathy. Lungs/Pleura: Small bilateral pleural effusions. 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 Dependent right greater than left lower lobe and less so upper lobe airspace and ground-glass opacities. Areas of nodularity, including within the left lower lobe at 9 mm on 121/7 and adjacent to a presumed infected bulla at 7 mm in the left lower lobe on 128/7. Upper Abdomen: Focal steatosis adjacent the falciform ligament. Normal imaged portions of the spleen, stomach, pancreas, adrenal glands, left kidney. Right renal low-density lesions of maximally 1.3 cm are fluid density, consistent with cysts. Musculoskeletal: No acute osseous abnormality. Review of the MIP images confirms the above findings. IMPRESSION: 1.  No evidence of pulmonary embolism. 2. Multifocal airspace and ground-glass opacity, primarily within the dependent lower lobes. Most consistent with infection or aspiration. 3. Areas of nodularity, most significant at 9 mm in the left lower lobe. Cannot exclude concurrent primary bronchogenic carcinoma. Recommend appropriate antibiotic therapy and CT follow-up at 2-3 months. 4. Small  bilateral pleural effusions. 5. Aortic atherosclerosis (ICD10-I70.0) and emphysema (ICD10-J43.9). Electronically Signed  By: Abigail Miyamoto M.D.   On: 01/26/2020 12:04   VAS [place] LOWER EXTREMITY VENOUS (DVT)  Result Date: 01/26/2020  Lower Venous DVTStudy Indications: D-dimer >20, not on anticoagulation, COVID-19.  Comparison Study: 01/22/2020 negative bilateral lower extremity venous duplex. Performing Technologist: Maudry Mayhew MHA, RDMS, RVT, RDCS  Examination Guidelines: A complete evaluation includes B-mode imaging, spectral Doppler, color Doppler, and power Doppler as needed of all accessible portions of each vessel. Bilateral testing is considered an integral part of a complete examination. Limited examinations for reoccurring indications may be performed as noted. The reflux portion of the exam is performed with the patient in reverse Trendelenburg.  +---------+---------------+---------+-----------+----------+--------------+  RIGHT     Compressibility Phasicity Spontaneity Properties Thrombus Aging  +---------+---------------+---------+-----------+----------+--------------+  CFV       Full            Yes       Yes                                    +---------+---------------+---------+-----------+----------+--------------+  SFJ       Full                                                             +---------+---------------+---------+-----------+----------+--------------+  FV Prox   Full                                                             +---------+---------------+---------+-----------+----------+--------------+  FV Mid    Full                                                             +---------+---------------+---------+-----------+----------+--------------+  FV Distal Full                                                             +---------+---------------+---------+-----------+----------+--------------+  PFV       Full                                                              +---------+---------------+---------+-----------+----------+--------------+  POP       Full            Yes       Yes                                    +---------+---------------+---------+-----------+----------+--------------+  PTV  None                      No                     Acute           +---------+---------------+---------+-----------+----------+--------------+   Right Technical Findings: Not visualized segments include Right peroneal veins.  +---------+---------------+---------+-----------+----------+--------------+  LEFT      Compressibility Phasicity Spontaneity Properties Thrombus Aging  +---------+---------------+---------+-----------+----------+--------------+  CFV       Full            Yes       Yes                                    +---------+---------------+---------+-----------+----------+--------------+  SFJ       Full                                                             +---------+---------------+---------+-----------+----------+--------------+  FV Prox   Full                                                             +---------+---------------+---------+-----------+----------+--------------+  FV Mid    Full                                                             +---------+---------------+---------+-----------+----------+--------------+  FV Distal Full                                                             +---------+---------------+---------+-----------+----------+--------------+  PFV       Full                                                             +---------+---------------+---------+-----------+----------+--------------+  POP       Full            Yes       Yes                                    +---------+---------------+---------+-----------+----------+--------------+  PTV                                 No  Acute           +---------+---------------+---------+-----------+----------+--------------+  PERO                                No                      Acute           +---------+---------------+---------+-----------+----------+--------------+ Inadequate visualization in transverse plane to perform compression maneuvers.    Summary: RIGHT: - Findings consistent with acute deep vein thrombosis involving the right posterior tibial veins. - No cystic structure found in the popliteal fossa.  LEFT: - Findings consistent with acute deep vein thrombosis involving the left posterior tibial veins, and left peroneal veins. - No cystic structure found in the popliteal fossa.  *See table(s) above for measurements and observations. Electronically signed by Servando Snare MD on 01/26/2020 at 2:47:49 PM.    Final         Scheduled Meds:  vitamin C  500 mg Oral Daily   dexamethasone (DECADRON) injection  6 mg Intravenous Q24H   Ipratropium-Albuterol  1 puff Inhalation QID   lisinopril  10 mg Oral Daily   metoprolol tartrate  50 mg Oral BID   mometasone-formoterol  2 puff Inhalation BID   rosuvastatin  10 mg Oral q1800   sodium chloride flush  3 mL Intravenous Q12H   sodium chloride flush  3 mL Intravenous Q12H   zinc sulfate  220 mg Oral Daily   Continuous Infusions:  sodium chloride     heparin 950 Units/hr (01/27/20 0624)   remdesivir 100 mg in NS 100 mL 100 mg (01/26/20 0917)     LOS: 3 days   The patient is critically ill with multiple organ systems failure and requires high complexity decision making for assessment and support, frequent evaluation and titration of therapies, application of advanced monitoring technologies and extensive interpretation of multiple databases. Critical Care Time devoted to patient care services described in this note  Time spent: 40 minutes     Prerana Strayer, Geraldo Docker, MD [facility] Pager [phone]  If 7PM-7AM, please contact night-coverage [email or web address] Password [facility] 01/27/2020, 8:39 AM

## 2020-01-27 NOTE — Plan of Care (Signed)
Patient remains on HFNC @ 8 Liters, down from 10 this AM. Able to maintain sats in the mid 90's. Bedrest until tomorrow per MD for bilateral LE DVT's. Heparin gtt continues @ 8.5 ml/hr or 8500 units. No S&S of bleeding.    Problem: Coping: Goal: Psychosocial and spiritual needs will be supported Outcome: Progressing   Problem: Respiratory: Goal: Will maintain a patent airway Outcome: Progressing

## 2020-01-27 NOTE — Progress Notes (Signed)
PT Cancellation Note  Patient Details Name: Anthony Morrison MRN: [id number] DOB: 1941/09/04   Cancelled Treatment:    Reason Eval/Treat Not Completed: Medical issues which prohibited therapy. Pt still on hold from therapy sec to B DVT discovered.  Horald Chestnut, PT    Delford Field 01/27/2020, 1:56 PM

## 2020-01-27 NOTE — Progress Notes (Signed)
[place] for Heparin  Indication: DVT  Allergies  Allergen Reactions  . Prednisone Itching  . Sulfa Antibiotics Other (See Comments)    Unknown    Patient Measurements: Height: 5\' 6"  (167.6 cm) Weight: 160 lb 4.4 oz (72.7 kg) IBW/kg (Calculated) : 63.8   Vital Signs: Temp: 98.4 F (36.9 C) (02/05 1151) Temp Source: Oral (02/05 1151) BP: 147/72 (02/05 1151) Pulse Rate: 96 (02/05 1151)  Labs: Recent Labs    01/25/20 0253 01/25/20 0253 01/26/20 0407 01/26/20 1954 01/27/20 0150 01/27/20 1246  HGB 13.3   < > 13.8  --  14.2  --   HCT 39.9  --  40.5  --  42.0  --   PLT 182  --  161  --  156  --   HEPARINUNFRC  --   --   --  0.57 0.82* 0.71*  CREATININE 1.07  --  1.08  --  1.17  --    < > = values in this interval not displayed.    Estimated Creatinine Clearance: 47 mL/min (by C-G formula based on SCr of 1.17 mg/dL).  Assessment: 79 year old male with COVID and bilateral DVTs to begin heparin. History of severe hemorrhoid bleed  Heparin level 0.71 (supratherapeutic) on gtt at 850 units/hr. No bleeding noted per RN  Goal of Therapy:  Heparin level 0.3-0.5 units/ml Monitor platelets by anticoagulation protocol: Yes   Plan:  Decrease heparin drip to 850 units / hr Will f/u 8 hr heparin level  Albertina Parr, PharmD., BCPS Clinical Pharmacist Clinical phone for 01/27/20 until 5pm: [phone]

## 2020-01-28 DIAGNOSIS — K922 Gastrointestinal hemorrhage, unspecified: Secondary | ICD-10-CM | POA: Diagnosis not present

## 2020-01-28 LAB — COMPREHENSIVE METABOLIC PANEL
ALT: 66 U/L — ABNORMAL HIGH (ref 0–44)
AST: 49 U/L — ABNORMAL HIGH (ref 15–41)
Albumin: 2.9 g/dL — ABNORMAL LOW (ref 3.5–5.0)
Alkaline Phosphatase: 73 U/L (ref 38–126)
Anion gap: 6 (ref 5–15)
BUN: 32 mg/dL — ABNORMAL HIGH (ref 8–23)
CO2: 22 mmol/L (ref 22–32)
Calcium: 7.9 mg/dL — ABNORMAL LOW (ref 8.9–10.3)
Chloride: 109 mmol/L (ref 98–111)
Creatinine, Ser: 0.95 mg/dL (ref 0.61–1.24)
GFR calc Af Amer: >60 mL/min (ref >60)
GFR calc non Af Amer: >60 mL/min (ref >60)
Glucose, Bld: 98 mg/dL (ref 70–99)
Potassium: 4.3 mmol/L (ref 3.5–5.1)
Sodium: 137 mmol/L (ref 135–145)
Total Bilirubin: 1 mg/dL (ref 0.3–1.2)
Total Protein: 5.5 g/dL — ABNORMAL LOW (ref 6.5–8.1)

## 2020-01-28 LAB — C-REACTIVE PROTEIN: CRP: 2.4 mg/dL — ABNORMAL HIGH (ref <1.0)

## 2020-01-28 LAB — FERRITIN: Ferritin: 491 ng/mL — ABNORMAL HIGH (ref 24–336)

## 2020-01-28 LAB — CBC WITH DIFFERENTIAL/PLATELET
Abs Immature Granulocytes: 0.14 10*3/uL — ABNORMAL HIGH (ref 0.00–0.07)
Basophils Absolute: 0 10*3/uL (ref 0.0–0.1)
Basophils Relative: 0 %
Eosinophils Absolute: 0 10*3/uL (ref 0.0–0.5)
Eosinophils Relative: 0 %
HCT: 40.7 % (ref 39.0–52.0)
Hemoglobin: 13.5 g/dL (ref 13.0–17.0)
Immature Granulocytes: 1 %
Lymphocytes Relative: 9 %
Lymphs Abs: 1.3 10*3/uL (ref 0.7–4.0)
MCH: 30.5 pg (ref 26.0–34.0)
MCHC: 33.2 g/dL (ref 30.0–36.0)
MCV: 92.1 fL (ref 80.0–100.0)
Monocytes Absolute: 0.6 10*3/uL (ref 0.1–1.0)
Monocytes Relative: 4 %
Neutro Abs: 11.6 10*3/uL — ABNORMAL HIGH (ref 1.7–7.7)
Neutrophils Relative %: 86 %
Platelets: 166 10*3/uL (ref 150–400)
RBC: 4.42 MIL/uL (ref 4.22–5.81)
RDW: 13.1 % (ref 11.5–15.5)
WBC: 13.7 10*3/uL — ABNORMAL HIGH (ref 4.0–10.5)
nRBC: 0 % (ref 0.0–0.2)

## 2020-01-28 LAB — HEMOGLOBIN AND HEMATOCRIT, BLOOD
HCT: 46.1 % (ref 39.0–52.0)
HCT: 40.9 % (ref 39.0–52.0)
Hemoglobin: 15.4 g/dL (ref 13.0–17.0)
Hemoglobin: 13.8 g/dL (ref 13.0–17.0)

## 2020-01-28 LAB — HEPARIN LEVEL (UNFRACTIONATED): Heparin Unfractionated: 0.49 IU/mL (ref 0.30–0.70)

## 2020-01-28 LAB — D-DIMER, QUANTITATIVE: D-Dimer, Quant: 14.35 ug/mL-FEU — ABNORMAL HIGH (ref 0.00–0.50)

## 2020-01-28 LAB — PHOSPHORUS: Phosphorus: 2.8 mg/dL (ref 2.5–4.6)

## 2020-01-28 LAB — MAGNESIUM: Magnesium: 2.2 mg/dL (ref 1.7–2.4)

## 2020-01-28 MED ORDER — PANTOPRAZOLE SODIUM 40 MG IV SOLR
40.0000 mg | Freq: Two times a day (BID) | INTRAVENOUS | Status: DC
  Administered 2020-01-28 – 2020-02-01 (×9): 40 mg via INTRAVENOUS
  Filled 2020-01-28 (×10): qty 40

## 2020-01-28 MED ORDER — OXYMETAZOLINE HCL 0.05 % NA SOLN
2.0000 | Freq: Two times a day (BID) | NASAL | Status: AC
  Administered 2020-01-28 – 2020-01-30 (×6): 2 via NASAL
  Filled 2020-01-28: qty 15

## 2020-01-28 NOTE — Progress Notes (Addendum)
Patient had an acute drop in O2 saturation with bradycardia (39). Patient states that he is congested and can not breath through his nose. Patient states that he would prefer to wear a NRB cause he is a mouth breather. Patient could benefit from CPAP overnight.

## 2020-01-28 NOTE — Progress Notes (Signed)
PROGRESS NOTE    Anthony Morrison  WUJ:[id number] DOB: 05-15-1941 DOA: 01/24/2020 PCP: Rusty Aus, MD   Brief Narrative:   79 y.o. WM PMHx IMA, CAD s/p CABG, CKD stage II-III, COPD  who was diagnosed with COVID-19 on 01/10/2020 was started on systemic steroids and antibiotics during this time as an outpatient progressively got worse came into the ED on 01/22/2020 and was home from the ED.  He continued to feel progressively worse with cough and shortness of breath.  He called EMS as he was significantly fatigued and short of breath was found to be febrile hypotensive and hypoxic and taken to [facility] ED in the ED he was found to be satting 80% on room air with a blood pressure of 60 and EKG showing an incomplete right bundle branch block left anterior fascicular block and sinus rhythm chest x-ray showed bilateral infiltrates with elevated creatinine leukocytosis and elevated procalcitonin as well as lactic acid.   Subjective: 2/6 afebrile last 24 hours A/O x4, positive S OB.  Patient complains of congested nose. ADDENDUM; RN contacted me stating patient had BM with significant amount of dark blood.    Assessment & Plan:   Principal Problem:   Acute hypoxemic respiratory failure due to COVID-19 [facility]) Active Problems:   S/P CABG x 3   ASCVD (arteriosclerotic cardiovascular disease)   Benign essential HTN   Hyperlipidemia, mixed   Panlobular emphysema (HCC)   CAP (community acquired pneumonia)   Acute renal failure superimposed on stage 3 chronic kidney disease (HCC)   Elevated troponin   Sepsis (HCC)   COPD (chronic obstructive pulmonary disease) (HCC)   Pneumonia due to COVID-19 virus   Elevated d-dimer   Acute deep vein thrombosis (DVT) of lower extremity (HCC)   Upper GI bleed   Covid pneumonia/acute respiratory failure with pneumonia COVID-19 Labs  Recent Labs    01/26/20 0407 01/27/20 0150 01/28/20 0130  DDIMER >20.00* >20.00* 14.35*  FERRITIN 626* 693* 491*  CRP  7.5* 4.6* 2.4*    Tested COVID-19 on 01/10/2020 (not in EMR)  -Decadron 6 mg daily -Remdesivir per pharmacy protocol -2/3 Actemra x1 dose -Vitamins per Covid protocol -Combivent -Dulera 200-5 mcg/ACT  CAP -Trend procalcitonin Results for Anthony Morrison, Anthony Morrison (MRN [id number]) as of 01/27/2020 10:25  Ref. Range 01/24/2020 04:11 01/25/2020 02:53 01/25/2020 08:06 01/26/2020 04:07 01/27/2020 01:50  Procalcitonin Latest Units: ng/mL 0.83 0.44 0.31 0.17 0.11  -Continue current antibiotics x5-day course    COPD -DC smoking 5 years ago -Dulera for home Advair -Not on home O2  Elevated D-dimer/ACUTE DVT lower extremity -Patient refuses anticoagulation, now patient's D-dimer> 2020 -2/4 bilateral lower extremity Doppler; positive for bilateral DVT see results below -2/4 CTA PE protocol; negative for PE see results below -2/4 patient has agreed to allow [place] to start heparin drip.  CAD s/p CABG January 2020 -TEE 1/24 EF 60-65% -Currently negative CP -Strict in and out - 460 ml -Daily weight Filed Weights   01/24/20 0200  Weight: 72.7 kg  -Lisinopril 10 mg daily -Metoprolol 50 mg BID -Hydralazine IV PRN SBP> 150 or DBP> 100  Essential HTN -See CAD  Anxiety -Ativan  Hx hemorrhoids -Per EMR Pt has been refusing aspirin and lovenox due to h/o bleeding hemorrhoids.  Dr. Posey Pronto explained to him that pathophysiology of covid and increased risk of clot/thrombosis.   Review of MAR PTA shows patient not on aspirin, Plavix   GI bleed -2/6 RN witnessed BM with significant amount of dark blood.  Discussed  case with RN she believes upper GI bleed, as blood was extremely dark in nature.  Patient asymptomatic. -H/H TID -Patient already on lowest dose therapeutic heparin possible, needs anticoagulation secondary to acute DVT -Protonix IV 40 mg twice daily Recent Labs  Lab 01/24/20 0411 01/25/20 0253 01/26/20 0407 01/27/20 0150 01/28/20 0130  HGB 13.8 13.3 13.8 14.2 13.5       DVT prophylaxis:  Heparin drip Code Status: Partial Family Communication: 2/4 Spoke [name] (son) counseled on plan of care, answered all questions Disposition Plan:    Consultants:    Procedures/Significant Events:  2/4 bilateral lower extremity Doppler; positive bilateral lower extremity DVT (prelim report) 2/4 CTA chest PE protocol; negative PE - Multifocal airspace and ground-glass opacity, primarily within the dependent lower lobes. Most consistent with infection or aspiration. -Areas of nodularity, most significant at 9 mm in the left lower lobe. Cannot exclude concurrent primary bronchogenic carcinoma. - Small bilateral pleural effusions. -Aortic atherosclerosis (ICD10-I70.0) and emphysema (ICD10-J43.9).    I have personally reviewed and interpreted all radiology studies and my findings are as above.  VENTILATOR SETTINGS: NRB 2/6 Flow; 15 L/min SPO2; 94%    Cultures 2/2 respiratory culture pending 2/2 sputum consistent with normal respiratory flora 2/2 HIV screen negative 2/3 acute hepatitis panel negative    Antimicrobials: Anti-infectives (From admission, onward)   Start     Dose/Rate Stop   01/24/20 2100  azithromycin (ZITHROMAX) 500 mg in sodium chloride 0.9 % 250 mL IVPB    Note to Pharmacy: Started at [facility] but not sure time of last dose   500 mg 250 mL/hr over 60 Minutes 01/27/20 2059   01/24/20 1400  cefTRIAXone (ROCEPHIN) 2 g in sodium chloride 0.9 % 100 mL IVPB    Note to Pharmacy: Started at [facility] but not sure time of last dose   2 g 200 mL/hr over 30 Minutes 01/27/20 1359   01/24/20 1000  remdesivir 100 mg in sodium chloride 0.9 % 100 mL IVPB     100 mg 200 mL/hr over 30 Minutes 01/28/20 0959       Devices    LINES / TUBES:      Continuous Infusions: . sodium chloride    . heparin 850 Units/hr (01/28/20 0600)     Objective: Vitals:   01/28/20 0600 01/28/20 0755 01/28/20 0845 01/28/20 1120  BP:  (!) 155/82  (!) 149/70  Pulse:  74  63  Resp:  20   (!) 22  Temp:  97.9 F (36.6 C)  98.3 F (36.8 C)  TempSrc:  Oral  Axillary  SpO2: 97% 96% 94% 100%  Weight:      Height:        Intake/Output Summary (Last 24 hours) at 01/28/2020 1355 Last data filed at 01/28/2020 0600 Gross per 24 hour  Intake 209.99 ml  Output 200 ml  Net 9.99 ml   Filed Weights   01/24/20 0200  Weight: 72.7 kg   Physical Exam:  General: A/O x4, positive acute respiratory distress Eyes: negative scleral hemorrhage, negative anisocoria, negative icterus ENT: Negative Runny nose, negative gingival bleeding, Neck:  Negative scars, masses, torticollis, lymphadenopathy, JVD Lungs: Decreased breath sounds bilaterally without wheezes or crackles Cardiovascular: Regular rate and rhythm without murmur gallop or rub normal S1 and S2 Abdomen: negative abdominal pain, nondistended, positive soft, bowel sounds, no rebound, no ascites, no appreciable mass Extremities: No significant cyanosis, clubbing, or edema bilateral lower extremities Skin: Negative rashes, lesions, ulcers Psychiatric:  Negative depression, negative anxiety,  negative fatigue, negative mania  Central nervous system:  Cranial nerves II through XII intact, tongue/uvula midline, all extremities muscle strength 5/5, sensation intact throughout, negative dysarthria, negative expressive aphasia, negative receptive aphasia.     Data Reviewed: Care during the described time interval was provided by me .  I have reviewed this patient's available data, including medical history, events of note, physical examination, and all test results as part of my evaluation.   CBC: Recent Labs  Lab 01/24/20 0411 01/25/20 0253 01/26/20 0407 01/27/20 0150 01/28/20 0130  WBC 12.7* 9.8 11.6* 12.5* 13.7*  NEUTROABS 10.8* 8.1* 9.4* 10.1* 11.6*  HGB 13.8 13.3 13.8 14.2 13.5  HCT 40.9 39.9 40.5 42.0 40.7  MCV 92.5 93.0 92.0 92.3 92.1  PLT 197 182 161 156 532   Basic Metabolic Panel: Recent Labs  Lab 01/24/20 0411  01/25/20 0253 01/25/20 0806 01/26/20 0407 01/27/20 0150 01/28/20 0130  NA 137 136  --  137 140 137  K 4.4 4.3  --  4.0 4.0 4.3  CL 108 107  --  107 110 109  CO2 20* 21*  --  22 22 22   GLUCOSE 87 95  --  91 99 98  BUN 30* 31*  --  31* 30* 32*  CREATININE 1.16 1.07  --  1.08 1.17 0.95  CALCIUM 8.0* 7.9*  --  7.9* 7.9* 7.9*  MG 2.1  --  2.2 2.2 2.3 2.2  PHOS  --   --  3.1 2.7 3.1 2.8   GFR: Estimated Creatinine Clearance: 57.8 mL/min (by C-G formula based on SCr of 0.95 mg/dL). Liver Function Tests: Recent Labs  Lab 01/24/20 0411 01/25/20 0253 01/26/20 0407 01/27/20 0150 01/28/20 0130  AST 42* 32 43* 70* 49*  ALT 30 28 38 70* 66*  ALKPHOS 54 57 65 73 73  BILITOT 0.7 0.5 0.2* 0.6 1.0  PROT 6.0* 5.4* 5.4* 5.5* 5.5*  ALBUMIN 2.7* 2.4* 2.5* 2.7* 2.9*   No results for input(s): LIPASE, AMYLASE in the last 168 hours. No results for input(s): AMMONIA in the last 168 hours. Coagulation Profile: No results for input(s): INR, PROTIME in the last 168 hours. Cardiac Enzymes: No results for input(s): CKTOTAL, CKMB, CKMBINDEX, TROPONINI in the last 168 hours. BNP (last 3 results) No results for input(s): PROBNP in the last 8760 hours. HbA1C: No results for input(s): HGBA1C in the last 72 hours. CBG: No results for input(s): GLUCAP in the last 168 hours. Lipid Profile: No results for input(s): CHOL, HDL, LDLCALC, TRIG, CHOLHDL, LDLDIRECT in the last 72 hours. Thyroid Function Tests: No results for input(s): TSH, T4TOTAL, FREET4, T3FREE, THYROIDAB in the last 72 hours. Anemia Panel: Recent Labs    01/27/20 0150 01/28/20 0130  FERRITIN 693* 491*   Urine analysis: No results found for: COLORURINE, APPEARANCEUR, LABSPEC, PHURINE, GLUCOSEU, HGBUR, BILIRUBINUR, KETONESUR, PROTEINUR, UROBILINOGEN, NITRITE, LEUKOCYTESUR Sepsis Labs: @LABRCNTIP (procalcitonin:4,lacticidven:4)  ) Recent Results (from the past 240 hour(s))  Blood Culture (routine x 2)     Status: None   Collection  Time: 01/22/20  3:49 PM   Specimen: BLOOD  Result Value Ref Range Status   Specimen Description BLOOD R FA  Final   Special Requests   Final    BOTTLES DRAWN AEROBIC AND ANAEROBIC Blood Culture adequate volume   Culture   Final    NO GROWTH 5 DAYS Performed at [facility], [address], [place], [place] [address]    Report Status 01/27/2020 FINAL  Final  Blood Culture (routine x  2)     Status: None   Collection Time: 01/22/20  3:55 PM   Specimen: BLOOD  Result Value Ref Range Status   Specimen Description BLOOD LAC  Final   Special Requests   Final    BOTTLES DRAWN AEROBIC AND ANAEROBIC Blood Culture results may not be optimal due to an excessive volume of blood received in culture bottles   Culture   Final    NO GROWTH 5 DAYS Performed at [facility], [address] [place], [place] [address]    Report Status 01/27/2020 FINAL  Final  Culture, sputum-assessment     Status: None   Collection Time: 01/24/20  2:22 AM   Specimen: Expectorated Sputum  Result Value Ref Range Status   Specimen Description EXPECTORATED SPUTUM  Final   Special Requests NONE  Final   Sputum evaluation   Final    THIS SPECIMEN IS ACCEPTABLE FOR SPUTUM CULTURE Performed at [facility], [place] [address], [place], [place] [address]    Report Status 01/24/2020 FINAL  Final  Culture, respiratory     Status: None   Collection Time: 01/24/20  2:22 AM  Result Value Ref Range Status   Specimen Description   Final    EXPECTORATED SPUTUM Performed at [place] [address], [place], [place] [address]    Special Requests   Final    NONE Reflexed from [id number] Performed at [facility], [place] [address], [place], [place] [address]    Gram Stain   Final    FEW WBC PRESENT,BOTH PMN AND MONONUCLEAR MODERATE SQUAMOUS EPITHELIAL CELLS PRESENT MODERATE GRAM POSITIVE COCCI IN PAIRS MODERATE GRAM POSITIVE RODS FEW GRAM NEGATIVE  RODS RARE BUDDING YEAST SEEN    Culture   Final    FEW Consistent with normal respiratory flora. Performed at [facility], [place] [address] [place], [place] [address]    Report Status 01/26/2020 FINAL  Final         Radiology Studies: No results found.      Scheduled Meds: . vitamin C  500 mg Oral Daily  . dexamethasone (DECADRON) injection  6 mg Intravenous Q24H  . Ipratropium-Albuterol  1 puff Inhalation QID  . lisinopril  10 mg Oral Daily  . metoprolol tartrate  50 mg Oral BID  . mometasone-formoterol  2 puff Inhalation BID  . oxymetazoline  2 spray Each Nare BID  . pantoprazole (PROTONIX) IV  40 mg Intravenous Q12H  . rosuvastatin  10 mg Oral q1800  . sodium chloride flush  3 mL Intravenous Q12H  . sodium chloride flush  3 mL Intravenous Q12H  . zinc sulfate  220 mg Oral Daily   Continuous Infusions: . sodium chloride    . heparin 850 Units/hr (01/28/20 0600)     LOS: 4 days   The patient is critically ill with multiple organ systems failure and requires high complexity decision making for assessment and support, frequent evaluation and titration of therapies, application of advanced monitoring technologies and extensive interpretation of multiple databases. Critical Care Time devoted to patient care services described in this note  Time spent: 40 minutes     Recie Cirrincione, Geraldo Docker, MD [facility] Pager [phone]  If 7PM-7AM, please contact night-coverage [email or web address] Password [email or web address] 01/28/2020, 1:55 PM

## 2020-01-29 LAB — CBC WITH DIFFERENTIAL/PLATELET
Abs Immature Granulocytes: 0.23 10*3/uL — ABNORMAL HIGH (ref 0.00–0.07)
Basophils Absolute: 0 10*3/uL (ref 0.0–0.1)
Basophils Relative: 0 %
Eosinophils Absolute: 0 10*3/uL (ref 0.0–0.5)
Eosinophils Relative: 0 %
HCT: 42.1 % (ref 39.0–52.0)
Hemoglobin: 14.1 g/dL (ref 13.0–17.0)
Immature Granulocytes: 2 %
Lymphocytes Relative: 10 %
Lymphs Abs: 1.6 10*3/uL (ref 0.7–4.0)
MCH: 31 pg (ref 26.0–34.0)
MCHC: 33.5 g/dL (ref 30.0–36.0)
MCV: 92.5 fL (ref 80.0–100.0)
Monocytes Absolute: 0.8 10*3/uL (ref 0.1–1.0)
Monocytes Relative: 5 %
Neutro Abs: 12.9 10*3/uL — ABNORMAL HIGH (ref 1.7–7.7)
Neutrophils Relative %: 83 %
Platelets: 173 10*3/uL (ref 150–400)
RBC: 4.55 MIL/uL (ref 4.22–5.81)
RDW: 13.2 % (ref 11.5–15.5)
WBC: 15.7 10*3/uL — ABNORMAL HIGH (ref 4.0–10.5)
nRBC: 0 % (ref 0.0–0.2)

## 2020-01-29 LAB — COMPREHENSIVE METABOLIC PANEL
ALT: 51 U/L — ABNORMAL HIGH (ref 0–44)
AST: 30 U/L (ref 15–41)
Albumin: 2.9 g/dL — ABNORMAL LOW (ref 3.5–5.0)
Alkaline Phosphatase: 74 U/L (ref 38–126)
Anion gap: 9 (ref 5–15)
BUN: 35 mg/dL — ABNORMAL HIGH (ref 8–23)
CO2: 20 mmol/L — ABNORMAL LOW (ref 22–32)
Calcium: 8 mg/dL — ABNORMAL LOW (ref 8.9–10.3)
Chloride: 108 mmol/L (ref 98–111)
Creatinine, Ser: 1.16 mg/dL (ref 0.61–1.24)
GFR calc Af Amer: >60 mL/min (ref >60)
GFR calc non Af Amer: 60 mL/min — ABNORMAL LOW (ref >60)
Glucose, Bld: 83 mg/dL (ref 70–99)
Potassium: 4.7 mmol/L (ref 3.5–5.1)
Sodium: 137 mmol/L (ref 135–145)
Total Bilirubin: 0.9 mg/dL (ref 0.3–1.2)
Total Protein: 5.6 g/dL — ABNORMAL LOW (ref 6.5–8.1)

## 2020-01-29 LAB — HEPARIN LEVEL (UNFRACTIONATED)
Heparin Unfractionated: 0.81 IU/mL — ABNORMAL HIGH (ref 0.30–0.70)
Heparin Unfractionated: 0.7 IU/mL (ref 0.30–0.70)

## 2020-01-29 LAB — HEMOGLOBIN AND HEMATOCRIT, BLOOD
HCT: 41.8 % (ref 39.0–52.0)
HCT: 42.3 % (ref 39.0–52.0)
Hemoglobin: 14.4 g/dL (ref 13.0–17.0)
Hemoglobin: 14 g/dL (ref 13.0–17.0)

## 2020-01-29 LAB — D-DIMER, QUANTITATIVE: D-Dimer, Quant: 9.26 ug/mL-FEU — ABNORMAL HIGH (ref 0.00–0.50)

## 2020-01-29 LAB — PHOSPHORUS: Phosphorus: 3.3 mg/dL (ref 2.5–4.6)

## 2020-01-29 LAB — C-REACTIVE PROTEIN: CRP: 1.2 mg/dL — ABNORMAL HIGH (ref <1.0)

## 2020-01-29 LAB — FERRITIN: Ferritin: 523 ng/mL — ABNORMAL HIGH (ref 24–336)

## 2020-01-29 LAB — MAGNESIUM: Magnesium: 2.3 mg/dL (ref 1.7–2.4)

## 2020-01-29 NOTE — Progress Notes (Signed)
PROGRESS NOTE    Anthony Morrison  WPY:[id number] DOB: 1941-11-24 DOA: 01/24/2020 PCP: Rusty Aus, MD   Brief Narrative:   80 y.o. WM PMHx IMA, CAD s/p CABG, CKD stage II-III, COPD  who was diagnosed with COVID-19 on 01/10/2020 was started on systemic steroids and antibiotics during this time as an outpatient progressively got worse came into the ED on 01/22/2020 and was home from the ED.  He continued to feel progressively worse with cough and shortness of breath.  He called EMS as he was significantly fatigued and short of breath was found to be febrile hypotensive and hypoxic and taken to [facility] ED in the ED he was found to be satting 80% on room air with a blood pressure of 60 and EKG showing an incomplete right bundle branch block left anterior fascicular block and sinus rhythm chest x-ray showed bilateral infiltrates with elevated creatinine leukocytosis and elevated procalcitonin as well as lactic acid.   Subjective: 2/7 afebrile overnight A/O x4, positive S OB.  Patient sitting comfortably in chair   Assessment & Plan:   Principal Problem:   Acute hypoxemic respiratory failure due to COVID-19 [facility]) Active Problems:   S/P CABG x 3   ASCVD (arteriosclerotic cardiovascular disease)   Benign essential HTN   Hyperlipidemia, mixed   Panlobular emphysema (HCC)   CAP (community acquired pneumonia)   Acute renal failure superimposed on stage 3 chronic kidney disease (HCC)   Elevated troponin   Sepsis (HCC)   COPD (chronic obstructive pulmonary disease) (HCC)   Pneumonia due to COVID-19 virus   Elevated d-dimer   Acute deep vein thrombosis (DVT) of lower extremity (HCC)   Upper GI bleed   Covid pneumonia/acute respiratory failure with pneumonia COVID-19 Labs  Recent Labs    01/27/20 0150 01/28/20 0130  DDIMER >20.00* 14.35*  FERRITIN 693* 491*  CRP 4.6* 2.4*    Tested COVID-19 on 01/10/2020 (not in EMR)  -Decadron 6 mg daily -Remdesivir per pharmacy protocol -2/3  Actemra x1 dose -Vitamins per Covid protocol -Combivent -Dulera 200-5 mcg/ACT  CAP -Trend procalcitonin Results for Anthony, Morrison (MRN [id number]) as of 01/27/2020 10:25  Ref. Range 01/24/2020 04:11 01/25/2020 02:53 01/25/2020 08:06 01/26/2020 04:07 01/27/2020 01:50  Procalcitonin Latest Units: ng/mL 0.83 0.44 0.31 0.17 0.11  -Continue current antibiotics x5-day course    COPD -DC smoking 5 years ago -Dulera for home Advair -Not on home O2  Elevated D-dimer/ACUTE DVT lower extremity -Patient refuses anticoagulation, now patient's D-dimer> 2020 -2/4 bilateral lower extremity Doppler; positive for bilateral DVT see results below -2/4 CTA PE protocol; negative for PE see results below -2/4 patient has agreed to allow [place] to start heparin drip.  CAD s/p CABG January 2020 -TEE 1/24 EF 60-65% -Currently negative CP -Strict in and out -459 ml -Daily weight Filed Weights   01/24/20 0200  Weight: 72.7 kg  -Lisinopril 10 mg daily -Metoprolol 50 mg BID -Hydralazine IV PRN SBP> 150 or DBP> 100  Essential HTN -See CAD  Anxiety -Ativan  Hx hemorrhoids -Per EMR Pt has been refusing aspirin and lovenox due to h/o bleeding hemorrhoids.  Dr. Posey Pronto explained to him that pathophysiology of covid and increased risk of clot/thrombosis.   Review of MAR PTA shows patient not on aspirin, Plavix   GI bleed -2/6 RN witnessed BM with significant amount of dark blood.  Discussed case with RN she believes upper GI bleed, as blood was extremely dark in nature.  Patient asymptomatic. -H/H TID -Patient already on  lowest dose therapeutic heparin possible, needs anticoagulation secondary to acute DVT -Protonix IV 40 mg twice daily Recent Labs  Lab 01/27/20 0150 01/28/20 0130 01/28/20 1435 01/28/20 2313 01/29/20 0605  HGB 14.2 13.5 15.4 13.8 14.4  14.1       DVT prophylaxis: Heparin drip Code Status: Partial Family Communication: 2/4 Spoke [name] (son) counseled on plan of care, answered all  questions Disposition Plan:    Consultants:    Procedures/Significant Events:  2/4 bilateral lower extremity Doppler; positive bilateral lower extremity DVT (prelim report) 2/4 CTA chest PE protocol; negative PE - Multifocal airspace and ground-glass opacity, primarily within the dependent lower lobes. Most consistent with infection or aspiration. -Areas of nodularity, most significant at 9 mm in the left lower lobe. Cannot exclude concurrent primary bronchogenic carcinoma. - Small bilateral pleural effusions. -Aortic atherosclerosis (ICD10-I70.0) and emphysema (ICD10-J43.9).    I have personally reviewed and interpreted all radiology studies and my findings are as above.  VENTILATOR SETTINGS: HFNC 2/7 Flow; 12 L/min SPO2; 90%    Cultures 2/2 respiratory culture pending 2/2 sputum consistent with normal respiratory flora 2/2 HIV screen negative 2/3 acute hepatitis panel negative    Antimicrobials: Anti-infectives (From admission, onward)   Start     Dose/Rate Stop   01/24/20 2100  azithromycin (ZITHROMAX) 500 mg in sodium chloride 0.9 % 250 mL IVPB    Note to Pharmacy: Started at [facility] but not sure time of last dose   500 mg 250 mL/hr over 60 Minutes 01/27/20 2059   01/24/20 1400  cefTRIAXone (ROCEPHIN) 2 g in sodium chloride 0.9 % 100 mL IVPB    Note to Pharmacy: Started at [facility] but not sure time of last dose   2 g 200 mL/hr over 30 Minutes 01/27/20 1359   01/24/20 1000  remdesivir 100 mg in sodium chloride 0.9 % 100 mL IVPB     100 mg 200 mL/hr over 30 Minutes 01/28/20 0959       Devices    LINES / TUBES:      Continuous Infusions: . sodium chloride    . heparin 850 Units/hr (01/29/20 0600)     Objective: Vitals:   01/29/20 0000 01/29/20 0005 01/29/20 0010 01/29/20 0400  BP:    134/66  Pulse:    (!) 59  Resp:    19  Temp:    97.9 F (36.6 C)  TempSrc:    Axillary  SpO2: 98% 97% 96% 97%  Weight:      Height:        Intake/Output Summary  (Last 24 hours) at 01/29/2020 0730 Last data filed at 01/29/2020 0600 Gross per 24 hour  Intake 201.03 ml  Output 200 ml  Net 1.03 ml   Filed Weights   01/24/20 0200  Weight: 72.7 kg   Physical Exam:  General: A/O x4, positive acute respiratory distress Eyes: negative scleral hemorrhage, negative anisocoria, negative icterus ENT: Negative Runny nose, negative gingival bleeding, Neck:  Negative scars, masses, torticollis, lymphadenopathy, JVD Lungs: Clear to auscultation bilaterally without wheezes or crackles Cardiovascular: Regular rate and rhythm without murmur gallop or rub normal S1 and S2 Abdomen: negative abdominal pain, nondistended, positive soft, bowel sounds, no rebound, no ascites, no appreciable mass Extremities: No significant cyanosis, clubbing, or edema bilateral lower extremities Skin: Negative rashes, lesions, ulcers Psychiatric:  Negative depression, negative anxiety, negative fatigue, negative mania  Central nervous system:  Cranial nerves II through XII intact, tongue/uvula midline, all extremities muscle strength 5/5, sensation intact throughout, negative  dysarthria, negative expressive aphasia, negative receptive aphasia.     Data Reviewed: Care during the described time interval was provided by me .  I have reviewed this patient's available data, including medical history, events of note, physical examination, and all test results as part of my evaluation.   CBC: Recent Labs  Lab 01/25/20 0253 01/25/20 0253 01/26/20 0407 01/26/20 0407 01/27/20 0150 01/28/20 0130 01/28/20 1435 01/28/20 2313 01/29/20 0605  WBC 9.8  --  11.6*  --  12.5* 13.7*  --   --  15.7*  NEUTROABS 8.1*  --  9.4*  --  10.1* 11.6*  --   --  12.9*  HGB 13.3   < > 13.8   < > 14.2 13.5 15.4 13.8 14.4  14.1  HCT 39.9   < > 40.5   < > 42.0 40.7 46.1 40.9 41.8  42.1  MCV 93.0  --  92.0  --  92.3 92.1  --   --  92.5  PLT 182  --  161  --  156 166  --   --  173   < > = values in this  interval not displayed.   Basic Metabolic Panel: Recent Labs  Lab 01/24/20 0411 01/25/20 0253 01/25/20 0806 01/26/20 0407 01/27/20 0150 01/28/20 0130  NA 137 136  --  137 140 137  K 4.4 4.3  --  4.0 4.0 4.3  CL 108 107  --  107 110 109  CO2 20* 21*  --  22 22 22   GLUCOSE 87 95  --  91 99 98  BUN 30* 31*  --  31* 30* 32*  CREATININE 1.16 1.07  --  1.08 1.17 0.95  CALCIUM 8.0* 7.9*  --  7.9* 7.9* 7.9*  MG 2.1  --  2.2 2.2 2.3 2.2  PHOS  --   --  3.1 2.7 3.1 2.8   GFR: Estimated Creatinine Clearance: 57.8 mL/min (by C-G formula based on SCr of 0.95 mg/dL). Liver Function Tests: Recent Labs  Lab 01/24/20 0411 01/25/20 0253 01/26/20 0407 01/27/20 0150 01/28/20 0130  AST 42* 32 43* 70* 49*  ALT 30 28 38 70* 66*  ALKPHOS 54 57 65 73 73  BILITOT 0.7 0.5 0.2* 0.6 1.0  PROT 6.0* 5.4* 5.4* 5.5* 5.5*  ALBUMIN 2.7* 2.4* 2.5* 2.7* 2.9*   No results for input(s): LIPASE, AMYLASE in the last 168 hours. No results for input(s): AMMONIA in the last 168 hours. Coagulation Profile: No results for input(s): INR, PROTIME in the last 168 hours. Cardiac Enzymes: No results for input(s): CKTOTAL, CKMB, CKMBINDEX, TROPONINI in the last 168 hours. BNP (last 3 results) No results for input(s): PROBNP in the last 8760 hours. HbA1C: No results for input(s): HGBA1C in the last 72 hours. CBG: No results for input(s): GLUCAP in the last 168 hours. Lipid Profile: No results for input(s): CHOL, HDL, LDLCALC, TRIG, CHOLHDL, LDLDIRECT in the last 72 hours. Thyroid Function Tests: No results for input(s): TSH, T4TOTAL, FREET4, T3FREE, THYROIDAB in the last 72 hours. Anemia Panel: Recent Labs    01/27/20 0150 01/28/20 0130  FERRITIN 693* 491*   Urine analysis: No results found for: COLORURINE, APPEARANCEUR, LABSPEC, PHURINE, GLUCOSEU, HGBUR, BILIRUBINUR, KETONESUR, PROTEINUR, UROBILINOGEN, NITRITE, LEUKOCYTESUR Sepsis Labs: @LABRCNTIP (procalcitonin:4,lacticidven:4)  ) Recent Results  (from the past 240 hour(s))  Blood Culture (routine x 2)     Status: None   Collection Time: 01/22/20  3:49 PM   Specimen: BLOOD  Result Value Ref Range Status  Specimen Description BLOOD R FA  Final   Special Requests   Final    BOTTLES DRAWN AEROBIC AND ANAEROBIC Blood Culture adequate volume   Culture   Final    NO GROWTH 5 DAYS Performed at [facility], [address] [place], [place] [address]    Report Status 01/27/2020 FINAL  Final  Blood Culture (routine x 2)     Status: None   Collection Time: 01/22/20  3:55 PM   Specimen: BLOOD  Result Value Ref Range Status   Specimen Description BLOOD LAC  Final   Special Requests   Final    BOTTLES DRAWN AEROBIC AND ANAEROBIC Blood Culture results may not be optimal due to an excessive volume of blood received in culture bottles   Culture   Final    NO GROWTH 5 DAYS Performed at [facility], [address] [place], [place] [address]    Report Status 01/27/2020 FINAL  Final  Culture, sputum-assessment     Status: None   Collection Time: 01/24/20  2:22 AM   Specimen: Expectorated Sputum  Result Value Ref Range Status   Specimen Description EXPECTORATED SPUTUM  Final   Special Requests NONE  Final   Sputum evaluation   Final    THIS SPECIMEN IS ACCEPTABLE FOR SPUTUM CULTURE Performed at [facility], [place] [address] [place], [place] [address]    Report Status 01/24/2020 FINAL  Final  Culture, respiratory     Status: None   Collection Time: 01/24/20  2:22 AM  Result Value Ref Range Status   Specimen Description   Final    EXPECTORATED SPUTUM Performed at [facility] [address] [place], [place] [address]    Special Requests   Final    NONE Reflexed from [id number] Performed at [facility], [place] [address] [place], [place] [address]    Gram Stain   Final    FEW WBC PRESENT,BOTH PMN AND MONONUCLEAR MODERATE SQUAMOUS EPITHELIAL CELLS  PRESENT MODERATE GRAM POSITIVE COCCI IN PAIRS MODERATE GRAM POSITIVE RODS FEW GRAM NEGATIVE RODS RARE BUDDING YEAST SEEN    Culture   Final    FEW Consistent with normal respiratory flora. Performed at [facility], [place] [address], [place],  [address]    Report Status 01/26/2020 FINAL  Final         Radiology Studies: No results found.      Scheduled Meds: . vitamin C  500 mg Oral Daily  . dexamethasone (DECADRON) injection  6 mg Intravenous Q24H  . Ipratropium-Albuterol  1 puff Inhalation QID  . lisinopril  10 mg Oral Daily  . metoprolol tartrate  50 mg Oral BID  . mometasone-formoterol  2 puff Inhalation BID  . oxymetazoline  2 spray Each Nare BID  . pantoprazole (PROTONIX) IV  40 mg Intravenous Q12H  . rosuvastatin  10 mg Oral q1800  . sodium chloride flush  3 mL Intravenous Q12H  . sodium chloride flush  3 mL Intravenous Q12H  . zinc sulfate  220 mg Oral Daily   Continuous Infusions: . sodium chloride    . heparin 850 Units/hr (01/29/20 0600)     LOS: 5 days   The patient is critically ill with multiple organ systems failure and requires high complexity decision making for assessment and support, frequent evaluation and titration of therapies, application of advanced monitoring technologies and extensive interpretation of multiple databases. Critical Care Time devoted to patient care services described  in this note  Time spent: 40 minutes     Caro Brundidge, Geraldo Docker, MD [facility] Pager [phone]  If 7PM-7AM, please contact night-coverage [email or web address] Password [email or web address] 01/29/2020, 7:30 AM

## 2020-01-29 NOTE — Progress Notes (Signed)
[place] for Heparin  Indication: DVT  Allergies  Allergen Reactions  . Prednisone Itching  . Sulfa Antibiotics Other (See Comments)    Unknown    Patient Measurements: Height: 5\' 6"  (167.6 cm) Weight: 160 lb 4.4 oz (72.7 kg) IBW/kg (Calculated) : 63.8   Vital Signs: Temp: 98.1 F (36.7 C) (02/07 1701) Temp Source: Oral (02/07 1701) BP: 100/49 (02/07 1701) Pulse Rate: 57 (02/07 1701)  Labs: Recent Labs    01/27/20 0150 01/27/20 1246 01/28/20 0130 01/28/20 1435 01/28/20 2313 01/28/20 2313 01/29/20 0605 01/29/20 1335 01/29/20 1710  HGB 14.2  --  13.5   < > 13.8   < > 14.4  14.1 14.0  --   HCT 42.0  --  40.7   < > 40.9  --  41.8  42.1 42.3  --   PLT 156  --  166  --   --   --  173  --   --   HEPARINUNFRC 0.82*   < > 0.49  --   --   --  0.81*  --  0.70  CREATININE 1.17  --  0.95  --   --   --  1.16  --   --    < > = values in this interval not displayed.    Estimated Creatinine Clearance: 47.4 mL/min (by C-G formula based on SCr of 1.16 mg/dL).  Assessment: 79 year old male with COVID and bilateral DVTs to begin heparin. History of severe hemorrhoid bleed  Heparin level this morning has trended up and SUPRAtherapeutic (HL 0.81 << 0.49, goal of 0.3-0.5). CBC stable and trended up despite RN reports of dark blood in stool. Will continue to monitor and reduce the drip rate this morning.   Heparin level came back at 0.7 this PM. We are shooting for a lower goal due to a hx of bleed.   Goal of Therapy:  Heparin level 0.3-0.5 units/ml Monitor platelets by anticoagulation protocol: Yes   Plan:  - Reduce Heparin to 600 units/hr - Heparin level in AM   Onnie Boer, PharmD, BCIDP, AAHIVP, CPP Infectious Disease Pharmacist 01/29/2020 7:02 PM

## 2020-01-29 NOTE — Progress Notes (Signed)
[place] for Heparin  Indication: DVT  Allergies  Allergen Reactions  . Prednisone Itching  . Sulfa Antibiotics Other (See Comments)    Unknown    Patient Measurements: Height: 5\' 6"  (167.6 cm) Weight: 160 lb 4.4 oz (72.7 kg) IBW/kg (Calculated) : 63.8   Vital Signs: Temp: 97.9 F (36.6 C) (02/07 0400) Temp Source: Axillary (02/07 0400) BP: 134/66 (02/07 0400) Pulse Rate: 59 (02/07 0400)  Labs: Recent Labs    01/27/20 0150 01/27/20 0150 01/27/20 1246 01/27/20 2205 01/28/20 0130 01/28/20 0130 01/28/20 1435 01/28/20 1435 01/28/20 2313 01/29/20 0605  HGB 14.2   < >  --   --  13.5   < > 15.4   < > 13.8 14.4  14.1  HCT 42.0   < >  --   --  40.7   < > 46.1  --  40.9 41.8  42.1  PLT 156  --   --   --  166  --   --   --   --  173  HEPARINUNFRC 0.82*  --    < > 0.32 0.49  --   --   --   --  0.81*  CREATININE 1.17  --   --   --  0.95  --   --   --   --   --    < > = values in this interval not displayed.    Estimated Creatinine Clearance: 57.8 mL/min (by C-G formula based on SCr of 0.95 mg/dL).  Assessment: 79 year old male with COVID and bilateral DVTs to begin heparin. History of severe hemorrhoid bleed  Heparin level this morning has trended up and SUPRAtherapeutic (HL 0.81 << 0.49, goal of 0.3-0.5). CBC stable and trended up despite RN reports of dark blood in stool. Will continue to monitor and reduce the drip rate this morning.   Goal of Therapy:  Heparin level 0.3-0.5 units/ml Monitor platelets by anticoagulation protocol: Yes   Plan:  - Reduce Heparin to 700 units/hr (7 ml/hr) - Will continue to monitor for any signs/symptoms of bleeding and will follow up with heparin level in 8 hours   Thank you for allowing pharmacy to be a part of this patient's care.  Alycia Rossetti, PharmD, BCPS Clinical Pharmacist 01/29/2020 8:02 AM   **Pharmacist phone directory can now be found on [email or web address] ([email or web address]).  Listed under [place].

## 2020-01-30 LAB — MAGNESIUM: Magnesium: 2.3 mg/dL (ref 1.7–2.4)

## 2020-01-30 LAB — HEMOGLOBIN AND HEMATOCRIT, BLOOD
HCT: 45.7 % (ref 39.0–52.0)
HCT: 43.6 % (ref 39.0–52.0)
Hemoglobin: 15.1 g/dL (ref 13.0–17.0)
Hemoglobin: 14.8 g/dL (ref 13.0–17.0)

## 2020-01-30 LAB — COMPREHENSIVE METABOLIC PANEL
ALT: 47 U/L — ABNORMAL HIGH (ref 0–44)
AST: 28 U/L (ref 15–41)
Albumin: 3.1 g/dL — ABNORMAL LOW (ref 3.5–5.0)
Alkaline Phosphatase: 73 U/L (ref 38–126)
Anion gap: 9 (ref 5–15)
BUN: 40 mg/dL — ABNORMAL HIGH (ref 8–23)
CO2: 20 mmol/L — ABNORMAL LOW (ref 22–32)
Calcium: 8.1 mg/dL — ABNORMAL LOW (ref 8.9–10.3)
Chloride: 109 mmol/L (ref 98–111)
Creatinine, Ser: 1.14 mg/dL (ref 0.61–1.24)
GFR calc Af Amer: >60 mL/min (ref >60)
GFR calc non Af Amer: >60 mL/min (ref >60)
Glucose, Bld: 96 mg/dL (ref 70–99)
Potassium: 4.2 mmol/L (ref 3.5–5.1)
Sodium: 138 mmol/L (ref 135–145)
Total Bilirubin: 0.8 mg/dL (ref 0.3–1.2)
Total Protein: 5.6 g/dL — ABNORMAL LOW (ref 6.5–8.1)

## 2020-01-30 LAB — CBC WITH DIFFERENTIAL/PLATELET
Abs Immature Granulocytes: 0.18 10*3/uL — ABNORMAL HIGH (ref 0.00–0.07)
Basophils Absolute: 0 10*3/uL (ref 0.0–0.1)
Basophils Relative: 0 %
Eosinophils Absolute: 0 10*3/uL (ref 0.0–0.5)
Eosinophils Relative: 0 %
HCT: 43.3 % (ref 39.0–52.0)
Hemoglobin: 14.6 g/dL (ref 13.0–17.0)
Immature Granulocytes: 1 %
Lymphocytes Relative: 9 %
Lymphs Abs: 1.3 10*3/uL (ref 0.7–4.0)
MCH: 31.3 pg (ref 26.0–34.0)
MCHC: 33.7 g/dL (ref 30.0–36.0)
MCV: 92.7 fL (ref 80.0–100.0)
Monocytes Absolute: 0.8 10*3/uL (ref 0.1–1.0)
Monocytes Relative: 5 %
Neutro Abs: 12.6 10*3/uL — ABNORMAL HIGH (ref 1.7–7.7)
Neutrophils Relative %: 85 %
Platelets: 178 10*3/uL (ref 150–400)
RBC: 4.67 MIL/uL (ref 4.22–5.81)
RDW: 13.4 % (ref 11.5–15.5)
WBC: 15 10*3/uL — ABNORMAL HIGH (ref 4.0–10.5)
nRBC: 0 % (ref 0.0–0.2)

## 2020-01-30 LAB — FERRITIN: Ferritin: 533 ng/mL — ABNORMAL HIGH (ref 24–336)

## 2020-01-30 LAB — PHOSPHORUS: Phosphorus: 3.6 mg/dL (ref 2.5–4.6)

## 2020-01-30 LAB — HEPARIN LEVEL (UNFRACTIONATED): Heparin Unfractionated: 0.72 IU/mL — ABNORMAL HIGH (ref 0.30–0.70)

## 2020-01-30 LAB — D-DIMER, QUANTITATIVE: D-Dimer, Quant: 5.71 ug/mL-FEU — ABNORMAL HIGH (ref 0.00–0.50)

## 2020-01-30 LAB — C-REACTIVE PROTEIN: CRP: 0.8 mg/dL (ref <1.0)

## 2020-01-30 NOTE — Progress Notes (Signed)
Report from [facility]. Pt stable,sitting at bedside, call light within reach.

## 2020-01-30 NOTE — Progress Notes (Signed)
[place] for Heparin  Indication: DVT  Allergies  Allergen Reactions  . Prednisone Itching  . Sulfa Antibiotics Other (See Comments)    Unknown    Patient Measurements: Height: 5\' 6"  (167.6 cm) Weight: 143 lb (64.9 kg) IBW/kg (Calculated) : 63.8   Vital Signs: Temp: 98.2 F (36.8 C) (02/08 0710) Temp Source: Oral (02/08 0710) BP: 128/68 (02/08 0710) Pulse Rate: 80 (02/08 0710)  Labs: Recent Labs    01/28/20 0130 01/28/20 1435 01/29/20 0605 01/29/20 0605 01/29/20 1335 01/29/20 1335 01/29/20 1710 01/29/20 2240 01/30/20 0600  HGB 13.5   < > 14.4  14.1   < > 14.0   < >  --  15.1 14.6  14.8  HCT 40.7   < > 41.8  42.1   < > 42.3  --   --  45.7 43.3  43.6  PLT 166  --  173  --   --   --   --   --  178  HEPARINUNFRC 0.49  --  0.81*  --   --   --  0.70  --  0.72*  CREATININE 0.95  --  1.16  --   --   --   --   --  1.14   < > = values in this interval not displayed.    Estimated Creatinine Clearance: 48.2 mL/min (by C-G formula based on SCr of 1.14 mg/dL).  Assessment: 79 year old male with COVID and bilateral DVTs to begin heparin. History of severe hemorrhoid bleed  Heparin level 0.72 this AM CBC stable   Goal of Therapy:  Heparin level 0.3-0.5 units/ml Monitor platelets by anticoagulation protocol: Yes   Plan:  - Reduce Heparin to 500 units/hr - Heparin level in AM   Thank you Anette Guarneri, PharmD 01/30/2020 8:36 AM

## 2020-01-30 NOTE — Progress Notes (Addendum)
PROGRESS NOTE    Anthony Morrison  CNO:[id number] DOB: [birth year] DOA: 01/24/2020 PCP: Rusty Aus, MD   Brief Narrative:   79 y.o. WM PMHx IMA, CAD s/p CABG, CKD stage II-III, COPD  who was diagnosed with COVID-19 on 01/10/2020 was started on systemic steroids and antibiotics during this time as an outpatient progressively got worse came into the ED on 01/22/2020 and was home from the ED.  He continued to feel progressively worse with cough and shortness of breath.  He called EMS as he was significantly fatigued and short of breath was found to be febrile hypotensive and hypoxic and taken to [facility] ED in the ED he was found to be satting 80% on room air with a blood pressure of 60 and EKG showing an incomplete right bundle branch block left anterior fascicular block and sinus rhythm chest x-ray showed bilateral infiltrates with elevated creatinine leukocytosis and elevated procalcitonin as well as lactic acid.   Subjective: 2/8 afebrile overnight, A/O x4, positive S OB (improving).  Sitting in chair comfortably   Assessment & Plan:   Principal Problem:   Acute hypoxemic respiratory failure due to COVID-19 [facility]) Active Problems:   S/P CABG x 3   ASCVD (arteriosclerotic cardiovascular disease)   Benign essential HTN   Hyperlipidemia, mixed   Panlobular emphysema (HCC)   CAP (community acquired pneumonia)   Acute renal failure superimposed on stage 3 chronic kidney disease (HCC)   Elevated troponin   Sepsis (HCC)   COPD (chronic obstructive pulmonary disease) (HCC)   Pneumonia due to COVID-19 virus   Elevated d-dimer   Acute deep vein thrombosis (DVT) of lower extremity (HCC)   Upper GI bleed   Covid pneumonia/acute respiratory failure with pneumonia COVID-19 Labs  Recent Labs    01/28/20 0130 01/29/20 0605  DDIMER 14.35* 9.26*  FERRITIN 491* 523*  CRP 2.4* 1.2*    Tested COVID-19 on 01/10/2020 (not in EMR)  -Decadron 6 mg daily -Remdesivir per pharmacy  protocol -2/3 Actemra x1 dose -Vitamins per Covid protocol -Combivent -Dulera 200-5 mcg/ACT  CAP -Trend procalcitonin Results for Anthony, Morrison (MRN [id number]) as of 01/27/2020 10:25  Ref. Range 01/24/2020 04:11 01/25/2020 02:53 01/25/2020 08:06 01/26/2020 04:07 01/27/2020 01:50  Procalcitonin Latest Units: ng/mL 0.83 0.44 0.31 0.17 0.11  -Continue current antibiotics x5-day course    COPD -DC smoking 5 years ago -Dulera for home Advair -Not on home O2  Elevated D-dimer/ACUTE DVT lower extremity -Patient refuses anticoagulation, now patient's D-dimer> 2020 -2/4 bilateral lower extremity Doppler; positive for bilateral DVT see results below -2/4 CTA PE protocol; negative for PE see results below -2/4 patient has agreed to allow [place] to start heparin drip.  CAD s/p CABG January 2020 -TEE 1/24 EF 60-65% -Currently negative CP -Strict in and out - 442 ml -Daily weight Filed Weights   01/24/20 0200 01/30/20 0500  Weight: 72.7 kg 64.9 kg  -Lisinopril 10 mg daily -Metoprolol 50 mg BID -Hydralazine IV PRN SBP> 150 or DBP> 100  Essential HTN -See CAD  Anxiety -Ativan  Hx hemorrhoids -Per EMR Pt has been refusing aspirin and lovenox due to h/o bleeding hemorrhoids.  Dr. Posey Pronto explained to him that pathophysiology of covid and increased risk of clot/thrombosis.   Review of MAR PTA shows patient not on aspirin, Plavix   GI bleed -2/6 RN witnessed BM with significant amount of dark blood.  Discussed case with RN she believes upper GI bleed, as blood was extremely dark in nature.  Patient asymptomatic. -  H/H TID -Patient already on lowest dose therapeutic heparin possible, needs anticoagulation secondary to acute DVT -Protonix IV 40 mg twice daily Recent Labs  Lab 01/28/20 2313 01/29/20 0605 01/29/20 1335 01/29/20 2240 01/30/20 0600  HGB 13.8 14.4  14.1 14.0 15.1 14.6  14.8  -Stable    DVT prophylaxis: Heparin drip Code Status: Partial Family Communication: 2/8 left message  [name] (son) phone that we called to update them on his father's care.   Disposition Plan:    Consultants:    Procedures/Significant Events:  2/4 bilateral lower extremity Doppler; positive bilateral lower extremity DVT (prelim report) 2/4 CTA chest PE protocol; negative PE - Multifocal airspace and ground-glass opacity, primarily within the dependent lower lobes. Most consistent with infection or aspiration. -Areas of nodularity, most significant at 9 mm in the left lower lobe. Cannot exclude concurrent primary bronchogenic carcinoma. - Small bilateral pleural effusions. -Aortic atherosclerosis (ICD10-I70.0) and emphysema (ICD10-J43.9).    I have personally reviewed and interpreted all radiology studies and my findings are as above.  VENTILATOR SETTINGS: HFNC 2/8 Flow; 6 L/min SPO2; 98%    Cultures 2/2 respiratory culture pending 2/2 sputum consistent with normal respiratory flora 2/2 HIV screen negative 2/3 acute hepatitis panel negative    Antimicrobials: Anti-infectives (From admission, onward)   Start     Dose/Rate Stop   01/24/20 2100  azithromycin (ZITHROMAX) 500 mg in sodium chloride 0.9 % 250 mL IVPB    Note to Pharmacy: Started at [facility] but not sure time of last dose   500 mg 250 mL/hr over 60 Minutes 01/26/20 2057   01/24/20 1400  cefTRIAXone (ROCEPHIN) 2 g in sodium chloride 0.9 % 100 mL IVPB    Note to Pharmacy: Started at [facility] but not sure time of last dose   2 g 200 mL/hr over 30 Minutes 01/26/20 1337   01/24/20 1000  remdesivir 100 mg in sodium chloride 0.9 % 100 mL IVPB     100 mg 200 mL/hr over 30 Minutes 01/27/20 1043       Devices    LINES / TUBES:      Continuous Infusions: . sodium chloride    . heparin 600 Units/hr (01/29/20 2141)     Objective: Vitals:   01/30/20 0400 01/30/20 0500 01/30/20 0600 01/30/20 0710  BP: 111/66   128/68  Pulse: 85   80  Resp: 16     Temp: 98.2 F (36.8 C)   98.2 F (36.8 C)  TempSrc: Oral    Oral  SpO2: 95%  92% 92%  Weight:  64.9 kg    Height:        Intake/Output Summary (Last 24 hours) at 01/30/2020 4742 Last data filed at 01/30/2020 0700 Gross per 24 hour  Intake 416.21 ml  Output 400 ml  Net 16.21 ml   Filed Weights   01/24/20 0200 01/30/20 0500  Weight: 72.7 kg 64.9 kg   Physical Exam:  General: A/O x4, positive acute respiratory distress Eyes: negative scleral hemorrhage, negative anisocoria, negative icterus ENT: Negative Runny nose, negative gingival bleeding, Neck:  Negative scars, masses, torticollis, lymphadenopathy, JVD Lungs: Clear to auscultation bilaterally without wheezes or crackles Cardiovascular: Regular rate and rhythm without murmur gallop or rub normal S1 and S2 Abdomen: negative abdominal pain, nondistended, positive soft, bowel sounds, no rebound, no ascites, no appreciable mass Extremities: No significant cyanosis, clubbing, or edema bilateral lower extremities Skin: Negative rashes, lesions, ulcers Psychiatric:  Negative depression, negative anxiety, negative fatigue, negative mania  Central nervous  system:  Cranial nerves II through XII intact, tongue/uvula midline, all extremities muscle strength 5/5, sensation intact throughout, negative dysarthria, negative expressive aphasia, negative receptive aphasia.     Data Reviewed: Care during the described time interval was provided by me .  I have reviewed this patient's available data, including medical history, events of note, physical examination, and all test results as part of my evaluation.   CBC: Recent Labs  Lab 01/26/20 0407 01/26/20 0407 01/27/20 0150 01/27/20 0150 01/28/20 0130 01/28/20 1435 01/28/20 2313 01/29/20 0605 01/29/20 1335 01/29/20 2240 01/30/20 0600  WBC 11.6*  --  12.5*  --  13.7*  --   --  15.7*  --   --  15.0*  NEUTROABS 9.4*  --  10.1*  --  11.6*  --   --  12.9*  --   --  12.6*  HGB 13.8   < > 14.2   < > 13.5   < > 13.8 14.4  14.1 14.0 15.1 14.6  14.8   HCT 40.5   < > 42.0   < > 40.7   < > 40.9 41.8  42.1 42.3 45.7 43.3  43.6  MCV 92.0  --  92.3  --  92.1  --   --  92.5  --   --  92.7  PLT 161  --  156  --  166  --   --  173  --   --  178   < > = values in this interval not displayed.   Basic Metabolic Panel: Recent Labs  Lab 01/26/20 0407 01/27/20 0150 01/28/20 0130 01/29/20 0605 01/30/20 0600  NA 137 140 137 137 138  K 4.0 4.0 4.3 4.7 4.2  CL 107 110 109 108 109  CO2 22 22 22  20* 20*  GLUCOSE 91 99 98 83 96  BUN 31* 30* 32* 35* 40*  CREATININE 1.08 1.17 0.95 1.16 1.14  CALCIUM 7.9* 7.9* 7.9* 8.0* 8.1*  MG 2.2 2.3 2.2 2.3 2.3  PHOS 2.7 3.1 2.8 3.3 3.6   GFR: Estimated Creatinine Clearance: 48.2 mL/min (by C-G formula based on SCr of 1.14 mg/dL). Liver Function Tests: Recent Labs  Lab 01/26/20 0407 01/27/20 0150 01/28/20 0130 01/29/20 0605 01/30/20 0600  AST 43* 70* 49* 30 28  ALT 38 70* 66* 51* 47*  ALKPHOS 65 73 73 74 73  BILITOT 0.2* 0.6 1.0 0.9 0.8  PROT 5.4* 5.5* 5.5* 5.6* 5.6*  ALBUMIN 2.5* 2.7* 2.9* 2.9* 3.1*   No results for input(s): LIPASE, AMYLASE in the last 168 hours. No results for input(s): AMMONIA in the last 168 hours. Coagulation Profile: No results for input(s): INR, PROTIME in the last 168 hours. Cardiac Enzymes: No results for input(s): CKTOTAL, CKMB, CKMBINDEX, TROPONINI in the last 168 hours. BNP (last 3 results) No results for input(s): PROBNP in the last 8760 hours. HbA1C: No results for input(s): HGBA1C in the last 72 hours. CBG: No results for input(s): GLUCAP in the last 168 hours. Lipid Profile: No results for input(s): CHOL, HDL, LDLCALC, TRIG, CHOLHDL, LDLDIRECT in the last 72 hours. Thyroid Function Tests: No results for input(s): TSH, T4TOTAL, FREET4, T3FREE, THYROIDAB in the last 72 hours. Anemia Panel: Recent Labs    01/28/20 0130 01/29/20 0605  FERRITIN 491* 523*   Urine analysis: No results found for: COLORURINE, APPEARANCEUR, LABSPEC, PHURINE, GLUCOSEU, HGBUR,  BILIRUBINUR, KETONESUR, PROTEINUR, UROBILINOGEN, NITRITE, LEUKOCYTESUR Sepsis Labs: @LABRCNTIP (procalcitonin:4,lacticidven:4)  ) Recent Results (from the past 240 hour(s))  Blood Culture (routine x  2)     Status: None   Collection Time: 01/22/20  3:49 PM   Specimen: BLOOD  Result Value Ref Range Status   Specimen Description BLOOD R FA  Final   Special Requests   Final    BOTTLES DRAWN AEROBIC AND ANAEROBIC Blood Culture adequate volume   Culture   Final    NO GROWTH 5 DAYS Performed at [facility], [address] [place], [place] [address]    Report Status 01/27/2020 FINAL  Final  Blood Culture (routine x 2)     Status: None   Collection Time: 01/22/20  3:55 PM   Specimen: BLOOD  Result Value Ref Range Status   Specimen Description BLOOD LAC  Final   Special Requests   Final    BOTTLES DRAWN AEROBIC AND ANAEROBIC Blood Culture results may not be optimal due to an excessive volume of blood received in culture bottles   Culture   Final    NO GROWTH 5 DAYS Performed at [facility], [address], [place], [place] [address]    Report Status 01/27/2020 FINAL  Final  Culture, sputum-assessment     Status: None   Collection Time: 01/24/20  2:22 AM   Specimen: Expectorated Sputum  Result Value Ref Range Status   Specimen Description EXPECTORATED SPUTUM  Final   Special Requests NONE  Final   Sputum evaluation   Final    THIS SPECIMEN IS ACCEPTABLE FOR SPUTUM CULTURE Performed at [facility], [place] [address], [place], [place] [address]    Report Status 01/24/2020 FINAL  Final  Culture, respiratory     Status: None   Collection Time: 01/24/20  2:22 AM  Result Value Ref Range Status   Specimen Description   Final    EXPECTORATED SPUTUM Performed at [facility] [address] [place], [place] [address]    Special Requests   Final    NONE Reflexed from [phone] Performed at [facility], [place]  [address] [place], [place] [address]    Gram Stain   Final    FEW WBC PRESENT,BOTH PMN AND MONONUCLEAR MODERATE SQUAMOUS EPITHELIAL CELLS PRESENT MODERATE GRAM POSITIVE COCCI IN PAIRS MODERATE GRAM POSITIVE RODS FEW GRAM NEGATIVE RODS RARE BUDDING YEAST SEEN    Culture   Final    FEW Consistent with normal respiratory flora. Performed at [facility], [place] [address], [place], [place] [address]    Report Status 01/26/2020 FINAL  Final         Radiology Studies: No results found.      Scheduled Meds: . vitamin C  500 mg Oral Daily  . dexamethasone (DECADRON) injection  6 mg Intravenous Q24H  . Ipratropium-Albuterol  1 puff Inhalation QID  . lisinopril  10 mg Oral Daily  . metoprolol tartrate  50 mg Oral BID  . mometasone-formoterol  2 puff Inhalation BID  . oxymetazoline  2 spray Each Nare BID  . pantoprazole (PROTONIX) IV  40 mg Intravenous Q12H  . rosuvastatin  10 mg Oral q1800  . sodium chloride flush  3 mL Intravenous Q12H  . sodium chloride flush  3 mL Intravenous Q12H  . zinc sulfate  220 mg Oral Daily   Continuous Infusions: . sodium chloride    . heparin 600 Units/hr (01/29/20 2141)     LOS: 6 days   The patient is critically ill with multiple organ systems failure and requires high complexity decision making for assessment and  support, frequent evaluation and titration of therapies, application of advanced monitoring technologies and extensive interpretation of multiple databases. Critical Care Time devoted to patient care services described in this note  Time spent: 40 minutes     Shantee Hayne, Geraldo Docker, MD [facility] Pager [phone]  If 7PM-7AM, please contact night-coverage [email or web address] Password [facility] 01/30/2020, 7:38 AM

## 2020-01-30 NOTE — Progress Notes (Signed)
Physical Therapy Treatment Patient Details Name: Anthony Morrison MRN: [id number] DOB: [birth year] Today's Date: 01/30/2020    History of Present Illness Anthony Morrison is a 79 y.o. male with medical history significant for emphysema, coronary artery disease status post CABG, and chronic kidney disease stage II-III who was diagnosed with COVID-19 on 01/10/2020 (visible in [place]), was started on systemic steroid and antibiotic in the outpatient setting, but continued to worsen and presented to the emergency department on 01/22/2020. He developed general malaise and cough ~1/15 and went on to develop some dyspnea and sputum production, but has not had any chest pain, abdominal pain, or leg swelling or tenderness.    PT Comments    Patient in [facility] chair when PT arrived. He stated he felt really weak, but definitely wanted to walk- to which he said "so what is the game plan today". Explained we would review exercises and definitely ambulate, end result to be in the [facility] chair. Reviewed IS and Flutter Valve unit, with good return demos. Ambulated in room with 9 LPM, [place], O2 sats running 92-95% with no shortness of breath. At end of session, BS chair, all lines cleared, IV plugged in , call light in reach. Reminded to perform LE ex- and in seated ankle pumps frequently and why. Left positioned in BS chair- cover over lap and BS table over lap. Trash can and urinal on his right.   Follow Up Recommendations  Home health PT(** Pending progression and weaning /decreasing Oxygen demands)     Equipment Recommendations  None recommended by PT    Recommendations for Other Services       Precautions / Restrictions Precautions Precautions: Fall Restrictions Weight Bearing Restrictions: No    Mobility  Bed Mobility Overal bed mobility: Needs Assistance Bed Mobility: Supine to Sit     Supine to sit: Supervision Sit to supine: Supervision   General bed mobility comments: set up and line  management  Transfers Overall transfer level: Needs assistance Equipment used: 1 person hand held assist(Used IV pole and occasional "surface touch") Transfers: Sit to/from Stand Sit to Stand: Min guard         General transfer comment: set up and line management  Ambulation/Gait Ambulation/Gait assistance: Min guard Gait Distance (Feet): 35 Feet Assistive device: 1 person hand held assist Gait Pattern/deviations: Step-through pattern;Narrow base of support Gait velocity: Decreased, unsteady Gait velocity interpretation: 1.31 - 2.62 ft/sec, indicative of limited community ambulator General Gait Details: Easily fatigued today- limited to room due to high flow- narrow base, intermittent retropulsion but could correct with cues-   Stairs             Wheelchair Mobility    Modified Rankin (Stroke Patients Only)       Balance Overall balance assessment: Needs assistance Sitting-balance support: Feet supported Sitting balance-Leahy Scale: Good     Standing balance support: During functional activity Standing balance-Leahy Scale: Fair                              Cognition Arousal/Alertness: Awake/alert   Overall Cognitive Status: Within Functional Limits for tasks assessed                                 General Comments: Very pleasant and motivated. Stated he had been able to sleep throught the night on 8 LPM- but once he woke  up, required increase in O2.      Exercises General Exercises - Lower Extremity Ankle Circles/Pumps: Seated;AROM Short Arc Quad: AROM;Seated Hip ABduction/ADduction: AROM;Seated Other Exercises Other Exercises: incentive spirometer x 10 Other Exercises: flutter valve x 10    General Comments General comments (skin integrity, edema, etc.): Very frail, monitor skin and joint integrity. P+,F- tone and turgor      Pertinent Vitals/Pain Pain Assessment: No/denies pain    Home Living                       Prior Function            PT Goals (current goals can now be found in the care plan section) Acute Rehab PT Goals Patient Stated Goal: needs encouragement for mobility PT Goal Formulation: With patient Time For Goal Achievement: 02/07/20 Potential to Achieve Goals: Good Progress towards PT goals: Progressing toward goals(very slowly progressing and still on HFNC)    Frequency    Min 3X/week      PT Plan Current plan remains appropriate    Co-evaluation              AM-PAC PT "6 Clicks" Mobility   Outcome Measure  Help needed turning from your back to your side while in a flat bed without using bedrails?: A Little Help needed moving from lying on your back to sitting on the side of a flat bed without using bedrails?: A Little Help needed moving to and from a bed to a chair (including a wheelchair)?: A Little Help needed standing up from a chair using your arms (e.g., wheelchair or bedside chair)?: A Little Help needed to walk in hospital room?: A Little Help needed climbing 3-5 steps with a railing? : A Little 6 Click Score: 18    End of Session Equipment Utilized During Treatment: Oxygen;Gait belt Activity Tolerance: Patient limited by fatigue Patient left: in chair;with call bell/phone within reach Nurse Communication: Mobility status PT Visit Diagnosis: Other abnormalities of gait and mobility (R26.89);Muscle weakness (generalized) (M62.81)     Time: 6314-9702 PT Time Calculation (min) (ACUTE ONLY): 48 min  Charges:  $Gait Training: 8-22 mins $Therapeutic Exercise: 8-22 mins $Therapeutic Activity: 8-22 mins                   Rollen Sox, PT # [phone] CGV cell  Casandra Doffing 01/30/2020, 1:00 PM

## 2020-01-31 ENCOUNTER — Inpatient Hospital Stay (HOSPITAL_COMMUNITY): Payer: PPO

## 2020-01-31 DIAGNOSIS — E43 Unspecified severe protein-calorie malnutrition: Secondary | ICD-10-CM | POA: Diagnosis present

## 2020-01-31 LAB — C-REACTIVE PROTEIN: CRP: 0.7 mg/dL (ref <1.0)

## 2020-01-31 LAB — CBC WITH DIFFERENTIAL/PLATELET
Abs Immature Granulocytes: 0.16 10*3/uL — ABNORMAL HIGH (ref 0.00–0.07)
Basophils Absolute: 0 10*3/uL (ref 0.0–0.1)
Basophils Relative: 0 %
Eosinophils Absolute: 0 10*3/uL (ref 0.0–0.5)
Eosinophils Relative: 0 %
HCT: 43.6 % (ref 39.0–52.0)
Hemoglobin: 14.2 g/dL (ref 13.0–17.0)
Immature Granulocytes: 1 %
Lymphocytes Relative: 9 %
Lymphs Abs: 1.1 10*3/uL (ref 0.7–4.0)
MCH: 30.6 pg (ref 26.0–34.0)
MCHC: 32.6 g/dL (ref 30.0–36.0)
MCV: 94 fL (ref 80.0–100.0)
Monocytes Absolute: 0.7 10*3/uL (ref 0.1–1.0)
Monocytes Relative: 6 %
Neutro Abs: 10.8 10*3/uL — ABNORMAL HIGH (ref 1.7–7.7)
Neutrophils Relative %: 84 %
Platelets: 219 10*3/uL (ref 150–400)
RBC: 4.64 MIL/uL (ref 4.22–5.81)
RDW: 13.7 % (ref 11.5–15.5)
WBC: 12.9 10*3/uL — ABNORMAL HIGH (ref 4.0–10.5)
nRBC: 0 % (ref 0.0–0.2)

## 2020-01-31 LAB — COMPREHENSIVE METABOLIC PANEL
ALT: 40 U/L (ref 0–44)
AST: 24 U/L (ref 15–41)
Albumin: 3.2 g/dL — ABNORMAL LOW (ref 3.5–5.0)
Alkaline Phosphatase: 75 U/L (ref 38–126)
Anion gap: 8 (ref 5–15)
BUN: 39 mg/dL — ABNORMAL HIGH (ref 8–23)
CO2: 21 mmol/L — ABNORMAL LOW (ref 22–32)
Calcium: 8.1 mg/dL — ABNORMAL LOW (ref 8.9–10.3)
Chloride: 108 mmol/L (ref 98–111)
Creatinine, Ser: 1 mg/dL (ref 0.61–1.24)
GFR calc Af Amer: >60 mL/min (ref >60)
GFR calc non Af Amer: >60 mL/min (ref >60)
Glucose, Bld: 94 mg/dL (ref 70–99)
Potassium: 4.2 mmol/L (ref 3.5–5.1)
Sodium: 137 mmol/L (ref 135–145)
Total Bilirubin: 0.7 mg/dL (ref 0.3–1.2)
Total Protein: 5.7 g/dL — ABNORMAL LOW (ref 6.5–8.1)

## 2020-01-31 LAB — HEPARIN LEVEL (UNFRACTIONATED): Heparin Unfractionated: 0.48 IU/mL (ref 0.30–0.70)

## 2020-01-31 LAB — D-DIMER, QUANTITATIVE: D-Dimer, Quant: 3.37 ug/mL-FEU — ABNORMAL HIGH (ref 0.00–0.50)

## 2020-01-31 LAB — MAGNESIUM: Magnesium: 2.4 mg/dL (ref 1.7–2.4)

## 2020-01-31 LAB — FERRITIN: Ferritin: 574 ng/mL — ABNORMAL HIGH (ref 24–336)

## 2020-01-31 LAB — PHOSPHORUS: Phosphorus: 2.9 mg/dL (ref 2.5–4.6)

## 2020-01-31 MED ORDER — APIXABAN 5 MG PO TABS
10.0000 mg | ORAL_TABLET | Freq: Two times a day (BID) | ORAL | Status: AC
  Administered 2020-01-31 – 2020-02-02 (×6): 10 mg via ORAL
  Filled 2020-01-31 (×7): qty 2

## 2020-01-31 MED ORDER — APIXABAN 5 MG PO TABS
5.0000 mg | ORAL_TABLET | Freq: Two times a day (BID) | ORAL | Status: DC
  Administered 2020-02-03: 5 mg via ORAL
  Filled 2020-01-31: qty 1

## 2020-01-31 MED ORDER — ENSURE ENLIVE PO LIQD
237.0000 mL | Freq: Three times a day (TID) | ORAL | Status: DC
  Administered 2020-02-02: 237 mL via ORAL

## 2020-01-31 NOTE — Progress Notes (Signed)
Pt c/o dry irritating cough, Robitussin given as per orders, will monitor effectiveness

## 2020-01-31 NOTE — Progress Notes (Signed)
SATURATION QUALIFICATIONS: (This note is used to comply with regulatory documentation for home oxygen)  Patient Saturations on Room Air at Rest = 92 %  Patient Saturations on Room Air while Ambulating = 86%  Patient Saturations on 4 Liters of oxygen while Ambulating = 94 %  Please briefly explain why patient needs home oxygen: Patient briefly desaturates while ambulating on room air. With 4L of nasal canula patient quickly improves to 94%.

## 2020-01-31 NOTE — Progress Notes (Signed)
Anthony Morrison for Heparin  Indication: DVT  Allergies  Allergen Reactions  . Prednisone Itching  . Sulfa Antibiotics Other (See Comments)    Unknown    Patient Measurements: Height: 5\' 6"  (167.6 cm) Weight: 143 lb (64.9 kg) IBW/kg (Calculated) : 63.8   Vital Signs: Temp: 98.4 F (36.9 C) (02/09 0000) Temp Source: Oral (02/09 0000) BP: 134/67 (02/09 0400) Pulse Rate: 55 (02/09 0030)  Labs: Recent Labs    01/29/20 0605 01/29/20 1335 01/29/20 1710 01/29/20 2240 01/29/20 2240 01/30/20 0600 01/31/20 0440  HGB 14.4  14.1   < >  --  15.1   < > 14.6  14.8 14.2  HCT 41.8  42.1   < >  --  45.7  --  43.3  43.6 43.6  PLT 173  --   --   --   --  178 219  HEPARINUNFRC 0.81*  --  0.70  --   --  0.72* 0.48  CREATININE 1.16  --   --   --   --  1.14  --    < > = values in this interval not displayed.    Estimated Creatinine Clearance: 48.2 mL/min (by C-G formula based on SCr of 1.14 mg/dL).  Assessment: 79 year old male with COVID and bilateral DVTs to begin heparin. History of severe hemorrhoid bleed  Heparin level 0.48 this morning CBC stable   Goal of Therapy:  Heparin level 0.3-0.5 units/ml Monitor platelets by anticoagulation protocol: Yes   Plan:  - Continue Heparin at 500 units/hr -Daily HL and CBC Monitor  For bleeding complications  Thanks for allowing pharmacy to be a part of this patient's care.  Excell Seltzer, PharmD Clinical Pharmacist  01/31/2020 6:24 AM

## 2020-01-31 NOTE — Discharge Instructions (Signed)
Information on my medicine - ELIQUIS (apixaban)  This medication education was reviewed with me or my healthcare representative as part of my discharge preparation.  The pharmacist that spoke with me during my hospital stay was:  Minda Ditto, [facility]  Why was Eliquis prescribed for you? Eliquis was prescribed to treat blood clots that may have been found in the veins of your legs (deep vein thrombosis) or in your lungs (pulmonary embolism) and to reduce the risk of them occurring again.  What do You need to know about Eliquis ? The starting dose is 10 mg (two 5 mg tablets) taken TWICE daily for the FIRST SEVEN (7) DAYS, then on (enter date) 02/07/2020  the dose is reduced to ONE 5 mg tablet taken TWICE daily.  Eliquis may be taken with or without food.   Try to take the dose about the same time in the morning and in the evening. If you have difficulty swallowing the tablet whole please discuss with your pharmacist how to take the medication safely.  Take Eliquis exactly as prescribed and DO NOT stop taking Eliquis without talking to the doctor who prescribed the medication.  Stopping may increase your risk of developing a new blood clot.  Refill your prescription before you run out.  After discharge, you should have regular check-up appointments with your healthcare provider that is prescribing your Eliquis.    What do you do if you miss a dose? If a dose of ELIQUIS is not taken at the scheduled time, take it as soon as possible on the same day and twice-daily administration should be resumed. The dose should not be doubled to make up for a missed dose.  Important Safety Information A possible side effect of Eliquis is bleeding. You should call your healthcare provider right away if you experience any of the following: ? Bleeding from an injury or your nose that does not stop. ? Unusual colored urine (red or dark brown) or unusual colored stools (red or black). ? Unusual bruising for  unknown reasons. ? A serious fall or if you hit your head (even if there is no bleeding).  Some medicines may interact with Eliquis and might increase your risk of bleeding or clotting while on Eliquis. To help avoid this, consult your healthcare provider or pharmacist prior to using any new prescription or non-prescription medications, including herbals, vitamins, non-steroidal anti-inflammatory drugs (NSAIDs) and supplements.  This website has more information on Eliquis (apixaban): http://[email or web address].com/eliquis/home

## 2020-01-31 NOTE — Progress Notes (Addendum)
PROGRESS NOTE    Anthony Morrison  ZHG:[id number] DOB: 06-05-1941 DOA: 01/24/2020 PCP: Rusty Aus, MD   Brief Narrative:   79 y.o. WM PMHx IMA, CAD s/p CABG, CKD stage II-III, COPD  who was diagnosed with COVID-19 on 01/10/2020 was started on systemic steroids and antibiotics during this time as an outpatient progressively got worse came into the ED on 01/22/2020 and was home from the ED.  He continued to feel progressively worse with cough and shortness of breath.  He called EMS as he was significantly fatigued and short of breath was found to be febrile hypotensive and hypoxic and taken to [facility] ED in the ED he was found to be satting 80% on room air with a blood pressure of 60 and EKG showing an incomplete right bundle branch block left anterior fascicular block and sinus rhythm chest x-ray showed bilateral infiltrates with elevated creatinine leukocytosis and elevated procalcitonin as well as lactic acid.   Subjective: 2/9 afebrile last 24 hours A/O x4, positive S OB (significant improvement).  Sitting in chair comfortably   Assessment & Plan:   Principal Problem:   Acute hypoxemic respiratory failure due to COVID-19 [facility]) Active Problems:   S/P CABG x 3   ASCVD (arteriosclerotic cardiovascular disease)   Benign essential HTN   Hyperlipidemia, mixed   Panlobular emphysema (HCC)   CAP (community acquired pneumonia)   Acute renal failure superimposed on stage 3 chronic kidney disease (HCC)   Elevated troponin   Sepsis (HCC)   COPD (chronic obstructive pulmonary disease) (HCC)   Pneumonia due to COVID-19 virus   Elevated d-dimer   Acute deep vein thrombosis (DVT) of lower extremity (HCC)   Upper GI bleed   Severe protein-calorie malnutrition (HCC)   Covid pneumonia/acute respiratory failure with pneumonia COVID-19 Labs  Recent Labs    01/29/20 0605 01/30/20 0600 01/31/20 0440  DDIMER 9.26* 5.71* 3.37*  FERRITIN 523* 533* 574*  CRP 1.2* 0.8 0.7    Tested  COVID-19 on 01/10/2020 (not in EMR)  -Decadron 6 mg daily -Remdesivir per pharmacy protocol -2/3 Actemra x1 dose -Vitamins per Covid protocol -Combivent -Dulera 200-5 mcg/ACT SATURATION QUALIFICATIONS: (This note is used to comply with regulatory documentation for home oxygen) Patient Saturations on Room Air at Rest = 92 % Patient Saturations on Room Air while Ambulating = 86% Patient Saturations on 4 Liters of oxygen while Ambulating = 94 % Please briefly explain why patient needs home oxygen: Patient briefly desaturates while ambulating on room air. With 4L of nasal canula patient quickly improves to 94% -Patient meets criteria for home O2 -4 L O2 via [place] titrate to maintain SPO2> 88% -Provide Inogen portable home O2 concentrator -Upon discharge patient WILL NOT be considered contagious.  Tested positive for Covid 1/19   CAP -Trend procalcitonin Results for Morrison, Anthony (MRN [id number]) as of 01/27/2020 10:25  Ref. Range 01/24/2020 04:11 01/25/2020 02:53 01/25/2020 08:06 01/26/2020 04:07 01/27/2020 01:50  Procalcitonin Latest Units: ng/mL 0.83 0.44 0.31 0.17 0.11  -Completed antibiotics x5-day course    COPD -DC smoking 5 years ago -Dulera for home Advair -Not on home O2  Elevated D-dimer/ACUTE DVT lower extremity -Patient refuses anticoagulation, now patient's D-dimer> 2020 -2/4 bilateral lower extremity Doppler; positive for bilateral DVT see results below -2/4 CTA PE protocol; negative for PE see results below -2/9 Heparin drip---> Eliquis  CAD s/p CABG January 2020 -TEE 1/24 EF 60-65% -Currently negative CP -Strict in and out -823.8 ml -Daily weight [employer]  01/24/20 0200 01/30/20 0500  Weight: 72.7 kg 64.9 kg  -Lisinopril 10 mg daily -Metoprolol 50 mg BID -Hydralazine IV PRN SBP> 150 or DBP> 100  Essential HTN -See CAD  Anxiety -Ativan  Hx hemorrhoids -Per EMR Pt has been refusing aspirin and lovenox due to h/o bleeding hemorrhoids.  Dr. Posey Pronto explained to  him that pathophysiology of covid and increased risk of clot/thrombosis.   Review of MAR PTA shows patient not on aspirin, Plavix   GI bleed -2/6 RN witnessed BM with significant amount of dark blood.  Discussed case with RN she believes upper GI bleed, as blood was extremely dark in nature.  Patient asymptomatic. -H/H TID -Patient already on lowest dose therapeutic heparin possible, needs anticoagulation secondary to acute DVT -Protonix IV 40 mg twice daily Recent Labs  Lab 01/29/20 0605 01/29/20 1335 01/29/20 2240 01/30/20 0600 01/31/20 0440  HGB 14.4  14.1 14.0 15.1 14.6  14.8 14.2  -Stable  Severe protein calorie malnutrition -Per nutrition   DVT prophylaxis: Heparin drip---> Eliquis Code Status: Partial Family Communication: 2/9 spoke with [name] (son) counseled on plan of care answered all questions.   Disposition Plan:    Consultants:    Procedures/Significant Events:  2/4 bilateral lower extremity Doppler; positive bilateral lower extremity DVT (prelim report) 2/4 CTA chest PE protocol; negative PE - Multifocal airspace and ground-glass opacity, primarily within the dependent lower lobes. Most consistent with infection or aspiration. -Areas of nodularity, most significant at 9 mm in the left lower lobe. Cannot exclude concurrent primary bronchogenic carcinoma. - Small bilateral pleural effusions. -Aortic atherosclerosis (ICD10-I70.0) and emphysema (ICD10-J43.9).    I have personally reviewed and interpreted all radiology studies and my findings are as above.  VENTILATOR SETTINGS: Nasal cannula 2/9 Flow; 5 L/min SPO2; 92%    Cultures 2/2 respiratory culture pending 2/2 sputum consistent with normal respiratory flora 2/2 HIV screen negative 2/3 acute hepatitis panel negative    Antimicrobials: Anti-infectives (From admission, onward)   Start     Dose/Rate Stop   01/24/20 2100  azithromycin (ZITHROMAX) 500 mg in sodium chloride 0.9 % 250 mL IVPB     Note to Pharmacy: Started at [facility] but not sure time of last dose   500 mg 250 mL/hr over 60 Minutes 01/26/20 2057   01/24/20 1400  cefTRIAXone (ROCEPHIN) 2 g in sodium chloride 0.9 % 100 mL IVPB    Note to Pharmacy: Started at [facility] but not sure time of last dose   2 g 200 mL/hr over 30 Minutes 01/26/20 1337   01/24/20 1000  remdesivir 100 mg in sodium chloride 0.9 % 100 mL IVPB     100 mg 200 mL/hr over 30 Minutes 01/27/20 1043       Devices    LINES / TUBES:      Continuous Infusions: . sodium chloride    . heparin 500 Units/hr (01/30/20 1900)     Objective: Vitals:   01/31/20 0630 01/31/20 0715 01/31/20 0730 01/31/20 0805  BP:  128/63  128/63  Pulse: 60 64 (!) 58 69  Resp:    18  Temp:  98.6 F (37 C)  98.5 F (36.9 C)  TempSrc:  Oral    SpO2: 94% (!) 84% 92% 93%  Weight:      Height:        Intake/Output Summary (Last 24 hours) at 01/31/2020 1134 Last data filed at 01/31/2020 0000 Gross per 24 hour  Intake 120 ml  Output 501 ml  Net -381  ml   Filed Weights   01/24/20 0200 01/30/20 0500  Weight: 72.7 kg 64.9 kg   Physical Exam:  General: A/O x4, positive acute respiratory distress, cachectic Eyes: negative scleral hemorrhage, negative anisocoria, negative icterus ENT: Negative Runny nose, negative gingival bleeding, Neck:  Negative scars, masses, torticollis, lymphadenopathy, JVD Lungs: Clear to auscultation bilaterally without wheezes or crackles Cardiovascular: Regular rate and rhythm without murmur gallop or rub normal S1 and S2 Abdomen: negative abdominal pain, nondistended, positive soft, bowel sounds, no rebound, no ascites, no appreciable mass Extremities: No significant cyanosis, clubbing, or edema bilateral lower extremities Skin: Negative rashes, lesions, ulcers Psychiatric:  Negative depression, negative anxiety, negative fatigue, negative mania  Central nervous system:  Cranial nerves II through XII intact, tongue/uvula midline, all  extremities muscle strength 5/5, sensation intact throughout, negative dysarthria, negative expressive aphasia, negative receptive aphasia.     Data Reviewed: Care during the described time interval was provided by me .  I have reviewed this patient's available data, including medical history, events of note, physical examination, and all test results as part of my evaluation.   CBC: Recent Labs  Lab 01/27/20 0150 01/27/20 0150 01/28/20 0130 01/28/20 1435 01/29/20 0605 01/29/20 1335 01/29/20 2240 01/30/20 0600 01/31/20 0440  WBC 12.5*  --  13.7*  --  15.7*  --   --  15.0* 12.9*  NEUTROABS 10.1*  --  11.6*  --  12.9*  --   --  12.6* 10.8*  HGB 14.2   < > 13.5   < > 14.4  14.1 14.0 15.1 14.6  14.8 14.2  HCT 42.0   < > 40.7   < > 41.8  42.1 42.3 45.7 43.3  43.6 43.6  MCV 92.3  --  92.1  --  92.5  --   --  92.7 94.0  PLT 156  --  166  --  173  --   --  178 219   < > = values in this interval not displayed.   Basic Metabolic Panel: Recent Labs  Lab 01/27/20 0150 01/28/20 0130 01/29/20 0605 01/30/20 0600 01/31/20 0440  NA 140 137 137 138 137  K 4.0 4.3 4.7 4.2 4.2  CL 110 109 108 109 108  CO2 22 22 20* 20* 21*  GLUCOSE 99 98 83 96 94  BUN 30* 32* 35* 40* 39*  CREATININE 1.17 0.95 1.16 1.14 1.00  CALCIUM 7.9* 7.9* 8.0* 8.1* 8.1*  MG 2.3 2.2 2.3 2.3 2.4  PHOS 3.1 2.8 3.3 3.6 2.9   GFR: Estimated Creatinine Clearance: 54.9 mL/min (by C-G formula based on SCr of 1 mg/dL). Liver Function Tests: Recent Labs  Lab 01/27/20 0150 01/28/20 0130 01/29/20 0605 01/30/20 0600 01/31/20 0440  AST 70* 49* 30 28 24   ALT 70* 66* 51* 47* 40  ALKPHOS 73 73 74 73 75  BILITOT 0.6 1.0 0.9 0.8 0.7  PROT 5.5* 5.5* 5.6* 5.6* 5.7*  ALBUMIN 2.7* 2.9* 2.9* 3.1* 3.2*   No results for input(s): LIPASE, AMYLASE in the last 168 hours. No results for input(s): AMMONIA in the last 168 hours. Coagulation Profile: No results for input(s): INR, PROTIME in the last 168 hours. Cardiac  Enzymes: No results for input(s): CKTOTAL, CKMB, CKMBINDEX, TROPONINI in the last 168 hours. BNP (last 3 results) No results for input(s): PROBNP in the last 8760 hours. HbA1C: No results for input(s): HGBA1C in the last 72 hours. CBG: No results for input(s): GLUCAP in the last 168 hours. Lipid Profile: No results  for input(s): CHOL, HDL, LDLCALC, TRIG, CHOLHDL, LDLDIRECT in the last 72 hours. Thyroid Function Tests: No results for input(s): TSH, T4TOTAL, FREET4, T3FREE, THYROIDAB in the last 72 hours. Anemia Panel: Recent Labs    01/30/20 0600 01/31/20 0440  FERRITIN 533* 574*   Urine analysis: No results found for: COLORURINE, APPEARANCEUR, LABSPEC, PHURINE, GLUCOSEU, HGBUR, BILIRUBINUR, KETONESUR, PROTEINUR, UROBILINOGEN, NITRITE, LEUKOCYTESUR Sepsis Labs: @LABRCNTIP (procalcitonin:4,lacticidven:4)  ) Recent Results (from the past 240 hour(s))  Blood Culture (routine x 2)     Status: None   Collection Time: 01/22/20  3:49 PM   Specimen: BLOOD  Result Value Ref Range Status   Specimen Description BLOOD R FA  Final   Special Requests   Final    BOTTLES DRAWN AEROBIC AND ANAEROBIC Blood Culture adequate volume   Culture   Final    NO GROWTH 5 DAYS Performed at [facility], [address] [place], [place] [address]    Report Status 01/27/2020 FINAL  Final  Blood Culture (routine x 2)     Status: None   Collection Time: 01/22/20  3:55 PM   Specimen: BLOOD  Result Value Ref Range Status   Specimen Description BLOOD LAC  Final   Special Requests   Final    BOTTLES DRAWN AEROBIC AND ANAEROBIC Blood Culture results may not be optimal due to an excessive volume of blood received in culture bottles   Culture   Final    NO GROWTH 5 DAYS Performed at [facility], [address], [place], [place] [address]    Report Status 01/27/2020 FINAL  Final  Culture, sputum-assessment     Status: None   Collection Time: 01/24/20  2:22 AM   Specimen: Expectorated  Sputum  Result Value Ref Range Status   Specimen Description EXPECTORATED SPUTUM  Final   Special Requests NONE  Final   Sputum evaluation   Final    THIS SPECIMEN IS ACCEPTABLE FOR SPUTUM CULTURE Performed at [facility], [place] [address], [place], [place] [address]    Report Status 01/24/2020 FINAL  Final  Culture, respiratory     Status: None   Collection Time: 01/24/20  2:22 AM  Result Value Ref Range Status   Specimen Description   Final    EXPECTORATED SPUTUM Performed at [place] [address] [place], [place] [address]    Special Requests   Final    NONE Reflexed from [phone] [id number] Performed at [facility], [place] [address], [place], [place] [address]    Gram Stain   Final    FEW WBC PRESENT,BOTH PMN AND MONONUCLEAR MODERATE SQUAMOUS EPITHELIAL CELLS PRESENT MODERATE GRAM POSITIVE COCCI IN PAIRS MODERATE GRAM POSITIVE RODS FEW GRAM NEGATIVE RODS RARE BUDDING YEAST SEEN    Culture   Final    FEW Consistent with normal respiratory flora. Performed at [facility], [place] [address], [place], [place] [address]    Report Status 01/26/2020 FINAL  Final         Radiology Studies: No results found.      Scheduled Meds: . vitamin C  500 mg Oral Daily  . dexamethasone (DECADRON) injection  6 mg Intravenous Q24H  . feeding supplement (ENSURE ENLIVE)  237 mL Oral TID BM  . Ipratropium-Albuterol  1 puff Inhalation QID  . lisinopril  10 mg Oral Daily  . metoprolol tartrate  50 mg Oral BID  . mometasone-formoterol  2 puff Inhalation BID  . pantoprazole (PROTONIX) IV  40 mg Intravenous Q12H  . rosuvastatin  10 mg Oral q1800  . sodium chloride flush  3 mL Intravenous Q12H  . sodium chloride flush  3 mL Intravenous Q12H  . zinc sulfate  220 mg Oral Daily   Continuous Infusions: . sodium chloride    . heparin 500 Units/hr (01/30/20 1900)     LOS: 7 days   The patient is critically ill with multiple  organ systems failure and requires high complexity decision making for assessment and support, frequent evaluation and titration of therapies, application of advanced monitoring technologies and extensive interpretation of multiple databases. Critical Care Time devoted to patient care services described in this note  Time spent: 40 minutes     Mystic Labo, Geraldo Docker, MD [facility] Pager [phone]  If 7PM-7AM, please contact night-coverage [email or web address] Password [facility] 01/31/2020, 11:34 AM

## 2020-01-31 NOTE — Progress Notes (Signed)
[place] for Apixaban  Indication: DVT (bilateral)  Allergies  Allergen Reactions  . Prednisone Itching  . Sulfa Antibiotics Other (See Comments)    Unknown   Patient Measurements: Height: 5\' 6"  (167.6 cm) Weight: 143 lb (64.9 kg) IBW/kg (Calculated) : 63.8  Vital Signs: Temp: 98.5 F (36.9 C) (02/09 0805) Temp Source: Oral (02/09 0715) BP: 128/63 (02/09 0805) Pulse Rate: 69 (02/09 0805)  Labs: Recent Labs    01/29/20 0605 01/29/20 1335 01/29/20 1710 01/29/20 2240 01/29/20 2240 01/30/20 0600 01/31/20 0440  HGB 14.4  14.1   < >  --  15.1   < > 14.6  14.8 14.2  HCT 41.8  42.1   < >  --  45.7  --  43.3  43.6 43.6  PLT 173  --   --   --   --  178 219  HEPARINUNFRC 0.81*  --  0.70  --   --  0.72* 0.48  CREATININE 1.16  --   --   --   --  1.14 1.00   < > = values in this interval not displayed.   Estimated Creatinine Clearance: 54.9 mL/min (by C-G formula based on SCr of 1 mg/dL).  Assessment: 79 year old male with COVID and bilateral DVTs began Heparin on 2/4. History of severe hemorrhoidal bleed CBC stable Ddimer decr to 3.37   Goal of Therapy:  Heparin level 0.3-0.5 units/ml Monitor platelets by anticoagulation protocol: Yes   Plan: 2/9 anti-coag to Apixaban, discontinue Heparin, begin Apixaban at same time Apixaban 10mg  bid x 7 days, then on 2/16 dose decreases to 5mg  bid Daily CBC Monitor for bleeding complications - hx hemorrhoidal bleed  Thank you for allowing pharmacy to be a part of this patient's care.  Minda Ditto, PharmD Clinical Pharmacist 01/31/2020 11:40 AM

## 2020-01-31 NOTE — Progress Notes (Signed)
Physical Therapy Treatment Patient Details Name: Anthony Morrison MRN: [id number] DOB: May 19, 1941 Today's Date: 01/31/2020    History of Present Illness Anthony Morrison is a 79 y.o. male with medical history significant for emphysema, coronary artery disease status post CABG, and chronic kidney disease stage II-III who was diagnosed with COVID-19 on 01/10/2020 (visible in [place]), was started on systemic steroid and antibiotic in the outpatient setting, but continued to worsen and presented to the emergency department on 01/22/2020. He developed general malaise and cough ~1/15 and went on to develop some dyspnea and sputum production, but has not had any chest pain, abdominal pain, or leg swelling or tenderness.    PT Comments    Pt did well with mobility this am, needed some encouragement to complete tasks but did very well once started. Worked on seated exercises in chair including incentive spirometer and also flutter valve, worked on sit<>stand from [employer], which pt did very well with needing on set up and stand by assist. Pt was also able to ambulate in room approx 75ft with set up and stand by/min guard assist.      Follow Up Recommendations  Home health PT     Equipment Recommendations  None recommended by PT    Recommendations for Other Services       Precautions / Restrictions Precautions Precautions: Fall Precaution Comments: monitor 02 sats desats with activity Restrictions Weight Bearing Restrictions: No    Mobility  Bed Mobility               General bed mobility comments: pt received sitting in receliner  Transfers Overall transfer level: Needs assistance Equipment used: None Transfers: Sit to/from Stand Sit to Stand: Supervision         General transfer comment: set up and line management  Ambulation/Gait Ambulation/Gait assistance: Min guard Gait Distance (Feet): 40 Feet Assistive device: None Gait Pattern/deviations: Step-through  pattern;Wide base of support         Stairs             Wheelchair Mobility    Modified Rankin (Stroke Patients Only)       Balance Overall balance assessment: Needs assistance Sitting-balance support: Feet supported Sitting balance-Leahy Scale: Good     Standing balance support: During functional activity Standing balance-Leahy Scale: Fair                              Cognition Arousal/Alertness: Awake/alert Behavior During Therapy: Anxious Overall Cognitive Status: No family/caregiver present to determine baseline cognitive functioning                                        Exercises Other Exercises Other Exercises: incentive spirometer x 10 Other Exercises: flutter valve x 10 Other Exercises: sit<>stand x 5    General Comments        Pertinent Vitals/Pain Pain Assessment: No/denies pain    Home Living                      Prior Function            PT Goals (current goals can now be found in the care plan section) Acute Rehab PT Goals Patient Stated Goal: wants to go home to spouse PT Goal Formulation: With patient Time For Goal Achievement: 02/07/20 Potential to Achieve Goals:  Good Progress towards PT goals: Progressing toward goals    Frequency    Min 3X/week      PT Plan Current plan remains appropriate    Co-evaluation              AM-PAC PT "6 Clicks" Mobility   Outcome Measure  Help needed turning from your back to your side while in a flat bed without using bedrails?: None Help needed moving from lying on your back to sitting on the side of a flat bed without using bedrails?: A Little Help needed moving to and from a bed to a chair (including a wheelchair)?: A Little Help needed standing up from a chair using your arms (e.g., wheelchair or bedside chair)?: A Little Help needed to walk in hospital room?: A Little Help needed climbing 3-5 steps with a railing? : A Little 6 Click  Score: 19    End of Session Equipment Utilized During Treatment: Oxygen;Gait belt Activity Tolerance: Patient limited by fatigue;Patient limited by lethargy;Treatment limited secondary to medical complications (Comment) Patient left: in chair;with call bell/phone within reach Nurse Communication: Mobility status PT Visit Diagnosis: Other abnormalities of gait and mobility (R26.89);Muscle weakness (generalized) (M62.81)     Time: 8333-8329 PT Time Calculation (min) (ACUTE ONLY): 32 min  Charges:  $Gait Training: 8-22 mins $Therapeutic Exercise: 8-22 mins                     Horald Chestnut, PT    Delford Field 01/31/2020, 12:51 PM

## 2020-01-31 NOTE — Progress Notes (Signed)
Received pt alert and oriented x3.  Pt has purple finger tips on both hands.  Pt says his grip has weakened since admission  Pt just came back from CT scan head. Awaiitng results

## 2020-01-31 NOTE — Progress Notes (Signed)
Initial Nutrition Assessment  DOCUMENTATION CODES:   Severe malnutrition in context of acute illness/injury  INTERVENTION:   Offer Ensure Enlive po TID, each supplement provides 350 kcal and 20 grams of protein  Pt receiving Hormel Shake daily with Breakfast which provides 520 kcals and 22 g of protein and Magic cup BID with lunch and dinner, each supplement provides 290 kcal and 9 grams of protein, automatically on meal trays to optimize nutritional intake.   NUTRITION DIAGNOSIS:   Severe Malnutrition related to acute illness(COVID-19) as evidenced by energy intake < or equal to 50% for > or equal to 5 days, percent weight loss(10% weight loss within a week).  GOAL:   Patient will meet greater than or equal to 90% of their needs  MONITOR:   PO intake, Supplement acceptance, Labs  REASON FOR ASSESSMENT:   Malnutrition Screening Tool    ASSESSMENT:   79 yo male admitted with COVID-19 PNA; tested positive for COVID-19 on 1/19; admitted on 1/31. PMH includes CAD, HTN, COPD.   For the past week, patient has been consuming 0-45% of most meals. Meeting < 50% of estimated energy requirement for > 5 days. He has lost 10% of usual weight within the past week. Patient meets criteria for severe malnutrition related to acute illness.  Labs reviewed.  Medications reviewed and include vitamin C, decadron, zinc.    NUTRITION - FOCUSED PHYSICAL EXAM:  unable to complete  Diet Order:   Diet Order            DIET - DYS 1 Room service appropriate? Yes; Fluid consistency: Thin  Diet effective now              EDUCATION NEEDS:   Not appropriate for education at this time  Skin:  Skin Assessment: Reviewed RN Assessment  Last BM:  2/7  Height:   Ht Readings from Last 1 Encounters:  01/24/20 5\' 6"  (1.676 m)    Weight:   Wt Readings from Last 1 Encounters:  01/30/20 64.9 kg    Ideal Body Weight:  64.5 kg  BMI:  Body mass index is 23.08 kg/m.  Estimated Nutritional  Needs:   Kcal:  1900-2200  Protein:  95-115 gm  Fluid:  >/= 1.9 L    Molli Barrows, RD, LDN, CNSC Contact information can be found in Amion.

## 2020-01-31 NOTE — Progress Notes (Signed)
Patient transferred off floor to room 313. Son, Mr. Joya Gaskins updated. Report given via phone to RN receiving patient in room 313. Patient transferred with belongings.

## 2020-01-31 NOTE — Progress Notes (Signed)
Occupational Therapy Treatment Patient Details Name: Anthony Morrison MRN: [id number] DOB: 07/15/1941 Today's Date: 01/31/2020    History of present illness Anthony Morrison is a 79 y.o. male with medical history significant for emphysema, coronary artery disease status post CABG, and chronic kidney disease stage II-III who was diagnosed with COVID-19 on 01/10/2020 (visible in [place]), was started on systemic steroid and antibiotic in the outpatient setting, but continued to worsen and presented to the emergency department on 01/22/2020. He developed general malaise and cough ~1/15 and went on to develop some dyspnea and sputum production, but has not had any chest pain, abdominal pain, or leg swelling or tenderness.   OT comments  Patient up in chair on arrival and ready to participate.  He remained on 2L O2 throughout session with SpO2 96 at rest.  Patient able to perform mobility and standing ADLs with supervision.  After about 10 min standing activity SpO2 dropped briefly to 85.  Patient recovered in <1 min with rest.  Patient complaining of L pinky and ring finger weakness.  Coordination decreased and sensation slightly impaired.  Participated in functional hand exercises with bathroom equipment to work on strengthening and coordination.  Recommending consult with Neurology and Outpatient follow up.  Will continue to follow acutely with OT to address the deficits listed below.   Follow Up Recommendations  Outpatient OT    Equipment Recommendations  None recommended by OT    Recommendations for Other Services Other (comment)(Outpatient Neuro)    Precautions / Restrictions Precautions Precautions: Fall Precaution Comments: monitor 02 sats desats with activity Restrictions Weight Bearing Restrictions: No       Mobility Bed Mobility               General bed mobility comments: pt received sitting in receliner  Transfers Overall transfer level: Needs  assistance Equipment used: Rolling walker (2 wheeled) Transfers: Sit to/from Stand Sit to Stand: Supervision              Balance Overall balance assessment: Needs assistance Sitting-balance support: Feet supported Sitting balance-Leahy Scale: Good     Standing balance support: During functional activity;Bilateral upper extremity supported Standing balance-Leahy Scale: Fair                             ADL either performed or assessed with clinical judgement   ADL Overall ADL's : Needs assistance/impaired     Grooming: Wash/dry hands;Supervision/safety;Standing   Upper Body Bathing: Supervision/ safety;Standing                           Functional mobility during ADLs: Supervision/safety;Rolling walker       Vision       Perception     Praxis      Cognition Arousal/Alertness: Awake/alert   Overall Cognitive Status: Within Functional Limits for tasks assessed                                          Exercises Exercises: Other exercises;Hand activities Other Exercises Other Exercises: incentive spirometer x 10 Other Exercises: flutter valve x 10 Other Exercises: Opening and closing twist top container Other Exercises: Palm down disk grip - grasp and release x 6 L hand Other Exercises: Cylindrical grip - grasp and release x6 L hand   Shoulder  Instructions       General Comments      Pertinent Vitals/ Pain       Pain Assessment: No/denies pain  Home Living                                          Prior Functioning/Environment              Frequency  Min 3X/week        Progress Toward Goals  OT Goals(current goals can now be found in the care plan section)  Progress towards OT goals: Progressing toward goals  Acute Rehab OT Goals Patient Stated Goal: wants to go home to spouse OT Goal Formulation: With patient Time For Goal Achievement: 02/07/20 Potential to Achieve Goals:  Good  Plan Discharge plan needs to be updated    Co-evaluation                 AM-PAC OT "6 Clicks" Daily Activity     Outcome Measure   Help from another person eating meals?: None Help from another person taking care of personal grooming?: A Little Help from another person toileting, which includes using toliet, bedpan, or urinal?: A Little Help from another person bathing (including washing, rinsing, drying)?: A Little Help from another person to put on and taking off regular upper body clothing?: A Little Help from another person to put on and taking off regular lower body clothing?: A Little 6 Click Score: 19    End of Session Equipment Utilized During Treatment: Gait belt;Rolling walker;Oxygen  OT Visit Diagnosis: Unsteadiness on feet (R26.81);Muscle weakness (generalized) (M62.81);Other symptoms and signs involving the nervous system (R29.898)   Activity Tolerance Patient tolerated treatment well   Patient Left in chair;with call bell/phone within reach   Nurse Communication Mobility status;Other (comment)(L hand status)        Time: 0216-0301 OT Time Calculation (min): 45 min  Charges: OT General Charges $OT Visit: 1 Visit OT Treatments $Self Care/Home Management : 8-22 mins $Therapeutic Activity: 23-37 mins   [name], OTR/L  [name] 01/31/2020, 4:53 PM

## 2020-02-01 DIAGNOSIS — G5622 Lesion of ulnar nerve, left upper limb: Secondary | ICD-10-CM

## 2020-02-01 LAB — CBC WITH DIFFERENTIAL/PLATELET
Abs Immature Granulocytes: 0.12 10*3/uL — ABNORMAL HIGH (ref 0.00–0.07)
Basophils Absolute: 0 10*3/uL (ref 0.0–0.1)
Basophils Relative: 0 %
Eosinophils Absolute: 0 10*3/uL (ref 0.0–0.5)
Eosinophils Relative: 0 %
HCT: 42.6 % (ref 39.0–52.0)
Hemoglobin: 14.2 g/dL (ref 13.0–17.0)
Immature Granulocytes: 1 %
Lymphocytes Relative: 11 %
Lymphs Abs: 1.2 10*3/uL (ref 0.7–4.0)
MCH: 30.8 pg (ref 26.0–34.0)
MCHC: 33.3 g/dL (ref 30.0–36.0)
MCV: 92.4 fL (ref 80.0–100.0)
Monocytes Absolute: 0.7 10*3/uL (ref 0.1–1.0)
Monocytes Relative: 7 %
Neutro Abs: 8.9 10*3/uL — ABNORMAL HIGH (ref 1.7–7.7)
Neutrophils Relative %: 81 %
Platelets: 185 10*3/uL (ref 150–400)
RBC: 4.61 MIL/uL (ref 4.22–5.81)
RDW: 13.6 % (ref 11.5–15.5)
WBC: 11 10*3/uL — ABNORMAL HIGH (ref 4.0–10.5)
nRBC: 0 % (ref 0.0–0.2)

## 2020-02-01 LAB — COMPREHENSIVE METABOLIC PANEL
ALT: 35 U/L (ref 0–44)
AST: 21 U/L (ref 15–41)
Albumin: 3.1 g/dL — ABNORMAL LOW (ref 3.5–5.0)
Alkaline Phosphatase: 60 U/L (ref 38–126)
Anion gap: 7 (ref 5–15)
BUN: 39 mg/dL — ABNORMAL HIGH (ref 8–23)
CO2: 23 mmol/L (ref 22–32)
Calcium: 8.2 mg/dL — ABNORMAL LOW (ref 8.9–10.3)
Chloride: 111 mmol/L (ref 98–111)
Creatinine, Ser: 1.19 mg/dL (ref 0.61–1.24)
GFR calc Af Amer: >60 mL/min (ref >60)
GFR calc non Af Amer: 58 mL/min — ABNORMAL LOW (ref >60)
Glucose, Bld: 90 mg/dL (ref 70–99)
Potassium: 4.5 mmol/L (ref 3.5–5.1)
Sodium: 141 mmol/L (ref 135–145)
Total Bilirubin: 1 mg/dL (ref 0.3–1.2)
Total Protein: 5.5 g/dL — ABNORMAL LOW (ref 6.5–8.1)

## 2020-02-01 LAB — PHOSPHORUS: Phosphorus: 3.3 mg/dL (ref 2.5–4.6)

## 2020-02-01 LAB — MAGNESIUM: Magnesium: 2.3 mg/dL (ref 1.7–2.4)

## 2020-02-01 LAB — D-DIMER, QUANTITATIVE: D-Dimer, Quant: 2.91 ug/mL-FEU — ABNORMAL HIGH (ref 0.00–0.50)

## 2020-02-01 LAB — FERRITIN: Ferritin: 516 ng/mL — ABNORMAL HIGH (ref 24–336)

## 2020-02-01 LAB — C-REACTIVE PROTEIN: CRP: 0.6 mg/dL (ref <1.0)

## 2020-02-01 MED ORDER — METOPROLOL TARTRATE 50 MG PO TABS
50.0000 mg | ORAL_TABLET | Freq: Two times a day (BID) | ORAL | Status: DC
  Administered 2020-02-02 – 2020-02-03 (×3): 50 mg via ORAL
  Filled 2020-02-01 (×3): qty 1

## 2020-02-01 MED ORDER — MAGIC MOUTHWASH
5.0000 mL | Freq: Four times a day (QID) | ORAL | Status: DC | PRN
  Administered 2020-02-01 – 2020-02-02 (×3): 5 mL via ORAL
  Filled 2020-02-01 (×6): qty 5

## 2020-02-01 MED ORDER — PANTOPRAZOLE SODIUM 40 MG PO TBEC
40.0000 mg | DELAYED_RELEASE_TABLET | Freq: Two times a day (BID) | ORAL | Status: DC
  Administered 2020-02-01 – 2020-02-03 (×4): 40 mg via ORAL
  Filled 2020-02-01 (×6): qty 1

## 2020-02-01 NOTE — Progress Notes (Signed)
Occupational Therapy Treatment Patient Details Name: Anthony Morrison MRN: [id number] DOB: 1941-02-27 Today's Date: 02/01/2020    History of present illness Anthony Morrison is a 79 y.o. male with medical history significant for emphysema, coronary artery disease status post CABG, and chronic kidney disease stage II-III who was diagnosed with COVID-19 on 01/10/2020 (visible in [place]), was started on systemic steroid and antibiotic in the outpatient setting, but continued to worsen and presented to the emergency department on 01/22/2020. He developed general malaise and cough ~1/15 and went on to develop some dyspnea and sputum production, but has not had any chest pain, abdominal pain, or leg swelling or tenderness.   OT comments  Educated/instructed pt on [facility] HEP for left hand with pt requiring min to mod cues on technique. Worked on finger flexion/extension, finger isolation, opposition, picking up coins and paperclips, and in hand manipulation. Noted 10+ drops throughout [facility] training. Educated/instructed pt on yellow theraputty HEP with pt requiring min to mod cues on technique. Pt reported mod fatigue of left hand following tasks. Recommend continued training to ensure recall of instructions and techniques. OT will continue to follow acutely.    Follow Up Recommendations  Outpatient OT    Equipment Recommendations  None recommended by OT    Recommendations for Other Services Other (comment)(Outpatient Neuro)    Precautions / Restrictions Precautions Precautions: Fall;Other (comment) Precaution Comments: monitor 02 sats desats with activity Restrictions Weight Bearing Restrictions: No       Mobility Bed Mobility Overal bed mobility: Needs Assistance Bed Mobility: Supine to Sit     Supine to sit: Supervision;HOB elevated     General bed mobility comments: Pt seated in bedside chair upon OT arrival.   Transfers Overall transfer level: Needs assistance Equipment used:  1 person hand held assist Transfers: Sit to/from Stand;Stand Pivot Transfers Sit to Stand: Min guard Stand pivot transfers: Min guard       General transfer comment: Not tested this session    Balance Overall balance assessment: (Not tested this session) Sitting-balance support: Feet supported Sitting balance-Leahy Scale: Good       Standing balance-Leahy Scale: Fair                             ADL either performed or assessed with clinical judgement   ADL Overall ADL's : Needs assistance/impaired                     Lower Body Dressing: Supervision/safety;Minimal assistance;Sitting/lateral leans Lower Body Dressing Details (indicate cue type and reason): To don/doff socks with use of left hand only             Functional mobility during ADLs: Min guard(hand held assist) General ADL Comments: Pt able to transfer to bedside chair with min hand held assist. Noted 0 instances of LOB, however pt unsteady on feet.      Vision       Perception     Praxis      Cognition Arousal/Alertness: Awake/alert Behavior During Therapy: WFL for tasks assessed/performed Overall Cognitive Status: Within Functional Limits for tasks assessed                                 General Comments: Pt frustrated with coordination deficits in left hand.        Exercises Exercises: Other exercises Other Exercises Other Exercises:  Educated pt on [facility] HEP for left hand. Worked on finger flexion/extension, finger isolation, opposition, picking up coins and paperclips, and in hand manipulation. Noted 10+ drops throughout [facility] training.  Other Exercises: Educated/instructed pt on yellow theraputty HEP with pt requiring min to mod cues on technique.    Shoulder Instructions       General Comments Educated/instructed pt on [facility] HEP with use of coins and paperclips. Also educated/instructed pt on theraputty exercises.     Pertinent Vitals/ Pain       Pain  Assessment: No/denies pain  Home Living                                          Prior Functioning/Environment              Frequency           Progress Toward Goals  OT Goals(current goals can now be found in the care plan section)  Progress towards OT goals: Progressing toward goals  ADL Goals Pt Will Perform Grooming: Independently;standing Pt Will Transfer to Toilet: Independently;regular height toilet;ambulating Pt Will Perform Toileting - Clothing Manipulation and hygiene: sit to/from stand;Independently Additional ADL Goal #1: Patient will maintain SpO2>90 during standing ADL. Additional ADL Goal #2: Patient will independently verbalize 3 energy conservation techniques to use at home. Additional ADL Goal #3: Pt to complete left hand [facility] HEP independently with 0 verbal cues.  Plan Discharge plan remains appropriate    Co-evaluation                 AM-PAC OT "6 Clicks" Daily Activity     Outcome Measure   Help from another person eating meals?: None Help from another person taking care of personal grooming?: A Little Help from another person toileting, which includes using toliet, bedpan, or urinal?: A Little Help from another person bathing (including washing, rinsing, drying)?: A Little Help from another person to put on and taking off regular upper body clothing?: A Little Help from another person to put on and taking off regular lower body clothing?: A Little 6 Click Score: 19    End of Session Equipment Utilized During Treatment: Oxygen  OT Visit Diagnosis: Unsteadiness on feet (R26.81);Muscle weakness (generalized) (M62.81);Other symptoms and signs involving the nervous system (R29.898)   Activity Tolerance Patient limited by fatigue   Patient Left in chair;with call bell/phone within reach   Nurse Communication Mobility status        Time: 1341-1411 OT Time Calculation (min): 30 min  Charges: OT General Charges $OT  Visit: 1 Visit OT Treatments $Self Care/Home Management : 8-22 mins $Therapeutic Activity: 8-22 mins $Therapeutic Exercise: 8-22 mins  Mauri Brooklyn OTR/L [phone]   Mauri Brooklyn 02/01/2020, 2:25 PM

## 2020-02-01 NOTE — Progress Notes (Signed)
PROGRESS NOTE  Anthony Morrison EPP:[id number] DOB: 1941-08-09 DOA: 01/24/2020 PCP: Rusty Aus, MD  HPI/Recap of past 88 hours: 79 year old male with past medical history of stage III chronic kidney disease, CAD status post CABG and COPD diagnosed with Covid on 1/19 and presented to the emergency room on 1/31 with progressively worsening shortness of breath and found to be hypoxic as well as have sepsis from Covid/community-acquired pneumonia, so admitted to the hospitalist service.  Patient treated with IV Actemra, Remdisivir and steroids and since then, oxygenation has improved although he still needs 2 to 5 L depending on exertion to keep oxygen levels above 90%.  Patient's hospital course has also been complicated by discovery of bilateral DVT and started on heparin, transition to Eliquis.  This was further complicated by reports of dark bloody bowel movement on 2/6, although interestingly, his hemoglobin has not changed at all since admission.  Patient also reports difficulty with left hand grip strength and numbness in his fourth and fifth fingers.  A CT scan of head was unremarkable on 2/9.  Patient states that this started when he was first admitted on 1/31 and has remained persistent.  No other complaints.  Assessment/Plan: Principal Problem:   Acute hypoxemic respiratory failure due to COVID-19 [facility]) with history of underlying COPD/emphysema: Status post Actemra and remdesivir.  Still on steroids.  Still requiring fair amount of oxygen. Active Problems: CAD S/P CABG x 3: Stable.  Elevated troponins felt to be secondary to respiratory dysfunction    Benign essential HTN: Blood pressure stable.    Hyperlipidemia, mixed: Continue statin    Acute renal failure superimposed on stage 2 chronic kidney disease ([place]): Secondary to initial sepsis.  Renal function close to baseline.    Sepsis ([place]) secondary to Covid pneumonia/CAP: Patient met criteria for sepsis on admission given  leukocytosis, acute kidney injury and lactic acidosis along with tachycardia and pulmonary source.  Elevated procalcitonin level of 2.21 as well.  Treated with IV Rocephin and Zithromax of which she has completed the full course of.  Lactic acidosis resolved with aggressive fluid resuscitation and sepsis felt to be stabilized.    Acute deep vein thrombosis (DVT) of lower extremity ([place]): Appears to be tolerating Eliquis well.  Bloody BM: No evidence of acute bleeding.  Watching hemoglobin very closely given that he is on Eliquis.  No further episodes.    Severe protein-calorie malnutrition [facility]): Patient meets criteria as evidenced by energy intake less than or equal to 50% for more than 5 days as well as 10% weight loss within a week.  Seen by nutrition and put on Hormel shake daily with breakfast plus Magic cup twice daily and Ensure Enlive 3 times daily.  BMI of 23.    Ulnar neuropathy of left upper extremity: No evidence of acute CVA.  We will start by checking MRI of left elbow   Code Status: Partial code  Family Communication: Son updated by phone   Disposition Plan: Potential discharge tomorrow following MRI  Consultants:  None  Procedures:  Lower extremity Doppler done 2/4+ for bilateral DVT  Antimicrobials:  IV Rocephin and Zithromax 1/31-2/4  IV Remdisivir 2/1-2/5  DVT prophylaxis: Eliquis   Objective: Vitals:   02/01/20 0800 02/01/20 1600  BP: (!) 132/51 (!) 106/59  Pulse: (!) 57 60  Resp: 18 20  Temp: 98.2 F (36.8 C) 98.8 F (37.1 C)  SpO2: 93% 92%    Intake/Output Summary (Last 24 hours) at 02/01/2020 1729 Last data filed  at 02/01/2020 1100 Gross per 24 hour  Intake 600 ml  Output 525 ml  Net 75 ml   Filed Weights   01/24/20 0200 01/30/20 0500  Weight: 72.7 kg 64.9 kg   Body mass index is 23.08 kg/m.  Exam:   General: Alert and oriented x3, no acute distress  HEENT: Normocephalic and atraumatic, mucous membranes are  moist  Cardiovascular: Regular rate and rhythm, S1-S2  Respiratory: Decreased breath sounds throughout  Abdomen: Soft, nontender, nondistended, positive bowel sounds  Musculoskeletal: No clubbing or cyanosis or edema  Skin: No skin breaks, tears or lesions  Psychiatry: Appropriate, no evidence of psychoses  Neuro: Decreased sensation left hand notably in the left fifth finger with decreased grip at approximately 3+/5 as compared to the right   Data Reviewed: CBC: Recent Labs  Lab 01/28/20 0130 01/28/20 1435 01/29/20 0605 01/29/20 0605 01/29/20 1335 01/29/20 2240 01/30/20 0600 01/31/20 0440 02/01/20 0550  WBC 13.7*  --  15.7*  --   --   --  15.0* 12.9* 11.0*  NEUTROABS 11.6*  --  12.9*  --   --   --  12.6* 10.8* 8.9*  HGB 13.5   < > 14.4  14.1   < > 14.0 15.1 14.6  14.8 14.2 14.2  HCT 40.7   < > 41.8  42.1   < > 42.3 45.7 43.3  43.6 43.6 42.6  MCV 92.1  --  92.5  --   --   --  92.7 94.0 92.4  PLT 166  --  173  --   --   --  178 219 185   < > = values in this interval not displayed.   Basic Metabolic Panel: Recent Labs  Lab 01/28/20 0130 01/29/20 0605 01/30/20 0600 01/31/20 0440 02/01/20 0550  NA 137 137 138 137 141  K 4.3 4.7 4.2 4.2 4.5  CL 109 108 109 108 111  CO2 22 20* 20* 21* 23  GLUCOSE 98 83 96 94 90  BUN 32* 35* 40* 39* 39*  CREATININE 0.95 1.16 1.14 1.00 1.19  CALCIUM 7.9* 8.0* 8.1* 8.1* 8.2*  MG 2.2 2.3 2.3 2.4 2.3  PHOS 2.8 3.3 3.6 2.9 3.3   GFR: Estimated Creatinine Clearance: 46.2 mL/min (by C-G formula based on SCr of 1.19 mg/dL). Liver Function Tests: Recent Labs  Lab 01/28/20 0130 01/29/20 0605 01/30/20 0600 01/31/20 0440 02/01/20 0550  AST 49* 30 28 24 21   ALT 66* 51* 47* 40 35  ALKPHOS 73 74 73 75 60  BILITOT 1.0 0.9 0.8 0.7 1.0  PROT 5.5* 5.6* 5.6* 5.7* 5.5*  ALBUMIN 2.9* 2.9* 3.1* 3.2* 3.1*   No results for input(s): LIPASE, AMYLASE in the last 168 hours. No results for input(s): AMMONIA in the last 168  hours. Coagulation Profile: No results for input(s): INR, PROTIME in the last 168 hours. Cardiac Enzymes: No results for input(s): CKTOTAL, CKMB, CKMBINDEX, TROPONINI in the last 168 hours. BNP (last 3 results) No results for input(s): PROBNP in the last 8760 hours. HbA1C: No results for input(s): HGBA1C in the last 72 hours. CBG: No results for input(s): GLUCAP in the last 168 hours. Lipid Profile: No results for input(s): CHOL, HDL, LDLCALC, TRIG, CHOLHDL, LDLDIRECT in the last 72 hours. Thyroid Function Tests: No results for input(s): TSH, T4TOTAL, FREET4, T3FREE, THYROIDAB in the last 72 hours. Anemia Panel: Recent Labs    01/31/20 0440 02/01/20 0550  FERRITIN 574* 516*   Urine analysis: No results found for: COLORURINE, APPEARANCEUR,  LABSPEC, PHURINE, GLUCOSEU, HGBUR, BILIRUBINUR, KETONESUR, PROTEINUR, UROBILINOGEN, NITRITE, LEUKOCYTESUR Sepsis Labs: @LABRCNTIP (procalcitonin:4,lacticidven:4)  ) Recent Results (from the past 240 hour(s))  Culture, sputum-assessment     Status: None   Collection Time: 01/24/20  2:22 AM   Specimen: Expectorated Sputum  Result Value Ref Range Status   Specimen Description EXPECTORATED SPUTUM  Final   Special Requests NONE  Final   Sputum evaluation   Final    THIS SPECIMEN IS ACCEPTABLE FOR SPUTUM CULTURE Performed at [facility], [place] [address] [place], [place] [address]    Report Status 01/24/2020 FINAL  Final  Culture, respiratory     Status: None   Collection Time: 01/24/20  2:22 AM  Result Value Ref Range Status   Specimen Description   Final    EXPECTORATED SPUTUM Performed at [place] [address], [place], [place] [address]    Special Requests   Final    NONE Reflexed from [id number] Performed at [facility], [place] [address], [place], [place] [address]    Gram Stain   Final    FEW WBC PRESENT,BOTH PMN AND MONONUCLEAR MODERATE SQUAMOUS EPITHELIAL CELLS  PRESENT MODERATE GRAM POSITIVE COCCI IN PAIRS MODERATE GRAM POSITIVE RODS FEW GRAM NEGATIVE RODS RARE BUDDING YEAST SEEN    Culture   Final    FEW Consistent with normal respiratory flora. Performed at [facility], [place] [address], [place], [place] [address]    Report Status 01/26/2020 FINAL  Final      Studies: No results found.  Scheduled Meds: . apixaban  10 mg Oral BID   Followed by  . [START ON 02/03/2020] apixaban  5 mg Oral BID  . vitamin C  500 mg Oral Daily  . dexamethasone (DECADRON) injection  6 mg Intravenous Q24H  . feeding supplement (ENSURE ENLIVE)  237 mL Oral TID BM  . Ipratropium-Albuterol  1 puff Inhalation QID  . lisinopril  10 mg Oral Daily  . metoprolol tartrate  50 mg Oral BID  . mometasone-formoterol  2 puff Inhalation BID  . pantoprazole  40 mg Oral BID  . rosuvastatin  10 mg Oral q1800  . sodium chloride flush  3 mL Intravenous Q12H  . sodium chloride flush  3 mL Intravenous Q12H  . zinc sulfate  220 mg Oral Daily    Continuous Infusions: . sodium chloride       LOS: 8 days     Annita Brod, MD [facility]   02/01/2020, 5:29 PM

## 2020-02-01 NOTE — Progress Notes (Signed)
Occupational Therapy Treatment Patient Details Name: Anthony Morrison MRN: [id number] DOB: [birth year] Today's Date: 02/01/2020    History of present illness Anthony Morrison is a 79 y.o. male with medical history significant for emphysema, coronary artery disease status post CABG, and chronic kidney disease stage II-III who was diagnosed with COVID-19 on 01/10/2020 (visible in [place]), was started on systemic steroid and antibiotic in the outpatient setting, but continued to worsen and presented to the emergency department on 01/22/2020. He developed general malaise and cough ~1/15 and went on to develop some dyspnea and sputum production, but has not had any chest pain, abdominal pain, or leg swelling or tenderness.   OT comments  Pt requiring increased supplemental oxygen this date as compared to previous sessions, noting decreased activity tolerance. Pt tolerated sitting edge of bed 15+ min with supervision while engaging in [facility] and self-care tasks. Pt frustrated with lack of coordination in left hand. When therapist asks pt to incorporate use of left hand into tasks pt responds "this hand doesn't work, I can't do it." Provided extensive education and encouragement regarding importance of using left hand during all tasks in order to promote function. Pt able to don/doff socks with left hand only while seated edge of bed utilizing one handed technique. Pt required increased time and encouragement to complete tasks with left hand. Pt able to transfer to bedside chair with min guard, too fatigued to engage in additional therapy tasks. Pt reported min to mod shortness of breath throughout. SpO2 dropped to 74% with minimal activity with pt requiring 4+ min seated recovery to return to 90%. Titrated oxygen up to 6L during activity with pt back down on 5L [place] at end of session. Pt demo good recall and use of pursed lip breathing techniques. Pt required increased time to complete all tasks due to shortness  of breath with frequent rest breaks. RN updated. OT will continue to follow acutely.    Follow Up Recommendations  Outpatient OT    Equipment Recommendations  None recommended by OT    Recommendations for Other Services Other (comment)(Outpatient Neuro)    Precautions / Restrictions Precautions Precautions: Fall;Other (comment) Precaution Comments: monitor 02 sats desats with activity Restrictions Weight Bearing Restrictions: No       Mobility Bed Mobility Overal bed mobility: Needs Assistance Bed Mobility: Supine to Sit     Supine to sit: Supervision;HOB elevated     General bed mobility comments: Use of bedrail.  Transfers Overall transfer level: Needs assistance Equipment used: 1 person hand held assist Transfers: Sit to/from [facility] Sit to Stand: Min guard Stand pivot transfers: Min guard            Balance Overall balance assessment: Needs assistance Sitting-balance support: Feet supported Sitting balance-Leahy Scale: Good       Standing balance-Leahy Scale: Fair                             ADL either performed or assessed with clinical judgement   ADL Overall ADL's : Needs assistance/impaired                     Lower Body Dressing: Supervision/safety;Minimal assistance;Sitting/lateral leans Lower Body Dressing Details (indicate cue type and reason): To don/doff socks with use of left hand only             Functional mobility during ADLs: Min guard(hand held assist) General ADL Comments: Pt  able to transfer to bedside chair with min hand held assist. Noted 0 instances of LOB, however pt unsteady on feet.      Vision       Perception     Praxis      Cognition Arousal/Alertness: Awake/alert Behavior During Therapy: (Frustrated) Overall Cognitive Status: Within Functional Limits for tasks assessed                                 General Comments: Pt frustrated with coordination  deficits in left hand requiring mod to max encouragement to use LUE during tasks.         Exercises Exercises: Other exercises Other Exercises Other Exercises: Encouraged pursed lip breathing throughout   Shoulder Instructions       General Comments Pt on 3L [place] with SpO2 94% at rest. SpO2 dropped to 74% with minimal activity and pt requiring 4+ min seated recovery to return to 90%. Oxygen titrated up to 6L [place] during activity. O2 back down to 5L at end of session. RN updated.     Pertinent Vitals/ Pain       Pain Assessment: No/denies pain  Home Living                                          Prior Functioning/Environment              Frequency           Progress Toward Goals  OT Goals(current goals can now be found in the care plan section)  Progress towards OT goals: Progressing toward goals  ADL Goals Pt Will Perform Grooming: Independently;standing Pt Will Transfer to Toilet: Independently;regular height toilet;ambulating Pt Will Perform Toileting - Clothing Manipulation and hygiene: sit to/from stand;Independently Additional ADL Goal #1: Patient will maintain SpO2>90 during standing ADL. Additional ADL Goal #2: Patient will independently verbalize 3 energy conservation techniques to use at home. Additional ADL Goal #3: Pt to complete left hand [facility] HEP independently with 0 verbal cues.  Plan Discharge plan remains appropriate    Co-evaluation                 AM-PAC OT "6 Clicks" Daily Activity     Outcome Measure   Help from another person eating meals?: None Help from another person taking care of personal grooming?: A Little Help from another person toileting, which includes using toliet, bedpan, or urinal?: A Little Help from another person bathing (including washing, rinsing, drying)?: A Little Help from another person to put on and taking off regular upper body clothing?: A Little Help from another person to put on and taking  off regular lower body clothing?: A Little 6 Click Score: 19    End of Session Equipment Utilized During Treatment: Oxygen  OT Visit Diagnosis: Unsteadiness on feet (R26.81);Muscle weakness (generalized) (M62.81);Other symptoms and signs involving the nervous system (R29.898)   Activity Tolerance Patient limited by fatigue(Limited by SOB)   Patient Left in chair;with call bell/phone within reach   Nurse Communication Mobility status        Time: 1010-1047 OT Time Calculation (min): 37 min  Charges: OT General Charges $OT Visit: 1 Visit OT Treatments $Self Care/Home Management : 8-22 mins $Therapeutic Activity: 8-22 mins  Mauri Brooklyn OTR/L [phone]   Mauri Brooklyn 02/01/2020, 11:46 AM

## 2020-02-02 LAB — COMPREHENSIVE METABOLIC PANEL
ALT: 34 U/L (ref 0–44)
AST: 24 U/L (ref 15–41)
Albumin: 3.1 g/dL — ABNORMAL LOW (ref 3.5–5.0)
Alkaline Phosphatase: 67 U/L (ref 38–126)
Anion gap: 10 (ref 5–15)
BUN: 42 mg/dL — ABNORMAL HIGH (ref 8–23)
CO2: 19 mmol/L — ABNORMAL LOW (ref 22–32)
Calcium: 8.1 mg/dL — ABNORMAL LOW (ref 8.9–10.3)
Chloride: 108 mmol/L (ref 98–111)
Creatinine, Ser: 1.15 mg/dL (ref 0.61–1.24)
GFR calc Af Amer: >60 mL/min (ref >60)
GFR calc non Af Amer: >60 mL/min (ref >60)
Glucose, Bld: 82 mg/dL (ref 70–99)
Potassium: 4.5 mmol/L (ref 3.5–5.1)
Sodium: 137 mmol/L (ref 135–145)
Total Bilirubin: 1.1 mg/dL (ref 0.3–1.2)
Total Protein: 5.6 g/dL — ABNORMAL LOW (ref 6.5–8.1)

## 2020-02-02 LAB — CBC WITH DIFFERENTIAL/PLATELET
Abs Immature Granulocytes: 0.07 10*3/uL (ref 0.00–0.07)
Basophils Absolute: 0 10*3/uL (ref 0.0–0.1)
Basophils Relative: 0 %
Eosinophils Absolute: 0.3 10*3/uL (ref 0.0–0.5)
Eosinophils Relative: 3 %
HCT: 45.3 % (ref 39.0–52.0)
Hemoglobin: 15.2 g/dL (ref 13.0–17.0)
Immature Granulocytes: 1 %
Lymphocytes Relative: 11 %
Lymphs Abs: 1.1 10*3/uL (ref 0.7–4.0)
MCH: 31.5 pg (ref 26.0–34.0)
MCHC: 33.6 g/dL (ref 30.0–36.0)
MCV: 93.8 fL (ref 80.0–100.0)
Monocytes Absolute: 0.7 10*3/uL (ref 0.1–1.0)
Monocytes Relative: 7 %
Neutro Abs: 7.9 10*3/uL — ABNORMAL HIGH (ref 1.7–7.7)
Neutrophils Relative %: 78 %
Platelets: 165 10*3/uL (ref 150–400)
RBC: 4.83 MIL/uL (ref 4.22–5.81)
RDW: 13.7 % (ref 11.5–15.5)
WBC: 10 10*3/uL (ref 4.0–10.5)
nRBC: 0 % (ref 0.0–0.2)

## 2020-02-02 LAB — FERRITIN: Ferritin: 484 ng/mL — ABNORMAL HIGH (ref 24–336)

## 2020-02-02 LAB — PHOSPHORUS: Phosphorus: 3.5 mg/dL (ref 2.5–4.6)

## 2020-02-02 LAB — D-DIMER, QUANTITATIVE: D-Dimer, Quant: 3.19 ug/mL-FEU — ABNORMAL HIGH (ref 0.00–0.50)

## 2020-02-02 LAB — MAGNESIUM: Magnesium: 2.3 mg/dL (ref 1.7–2.4)

## 2020-02-02 LAB — C-REACTIVE PROTEIN: CRP: <0.5 mg/dL (ref <1.0)

## 2020-02-02 LAB — GLUCOSE, CAPILLARY: Glucose-Capillary: 100 mg/dL — ABNORMAL HIGH (ref 70–99)

## 2020-02-02 MED ORDER — SODIUM CHLORIDE 0.9 % IV SOLN: INTRAVENOUS | Status: DC

## 2020-02-02 NOTE — Plan of Care (Deleted)
Pt progressing well.

## 2020-02-02 NOTE — Progress Notes (Signed)
This RN called patients son [name] to inform him of the update with case managerment and to inform him we moved his dad to room 101. [name] informed me he recently spoke with the case manager and they had given the wrong name of the desired facility and he's been updated about that process through the case management.  We will progress in the morning with the dc plan.

## 2020-02-02 NOTE — Progress Notes (Addendum)
Pt was adverse to transfers at this time due to fluids being administered. Pt was resting comfortably on 2.5L [place]. Discussed home environment and how he felt with going home. Pt states he feels weak and has lost >10 pounds since being here for the last 9 days. He reports understanding to explanation and demonstration of HEP. Planned to return back to give therabands and assist with ambulation/transfer with lunch trays to assess level required at discharge.     02/02/20 1117  PT Visit Information  Last PT Received On 02/02/20  Assistance Needed +1  History of Present Illness Anthony Morrison is a 79 y.o. male with medical history significant for emphysema, coronary artery disease status post CABG, and chronic kidney disease stage II-III who was diagnosed with COVID-19 on 01/10/2020 (visible in [place]), was started on systemic steroid and antibiotic in the outpatient setting, but continued to worsen and presented to the emergency department on 01/22/2020. He developed general malaise and cough ~1/15 and went on to develop some dyspnea and sputum production, but has not had any chest pain, abdominal pain, or leg swelling or tenderness.  Subjective Data  Patient Stated Goal wants to go home to spouse  Precautions  Precautions Fall;Other (comment)  Precaution Comments monitor 02 sats desats with activity  Restrictions  Weight Bearing Restrictions No  Pain Assessment  Pain Assessment No/denies pain  Cognition  Arousal/Alertness Awake/alert  Behavior During Therapy WFL for tasks assessed/performed  Overall Cognitive Status Within Functional Limits for tasks assessed  General Comments Pt states that he is concerned about getting rushed out of the hospital today. States he needs to save his energy and does not want to move around much due to requiring fluids currently  Balance  Overall balance assessment Needs assistance  Sitting-balance support Feet supported  Sitting balance-Leahy Scale  Good  Standing balance support During functional activity;Bilateral upper extremity supported  Standing balance-Leahy Scale Poor  Standing balance comment Pt was unable to maintain upright balance today without cruising on furniture, required assistance for line management and bed mobility prior.  General Comments  General comments (skin integrity, edema, etc.) Instructed on home HEP for LE strengthening and endurance. Pt verbalized understanding. States he has all needed DME.  Exercises  Exercises General Lower Extremity  General Exercises - Lower Extremity  Ankle Circles/Pumps AROM;Both;10 reps  Long Arc Quad AROM;Both;10 reps;Seated  Hip Flexion/Marching AROM;10 reps;Both;Seated  PT - End of Session  Equipment Utilized During Treatment Oxygen  Activity Tolerance Patient limited by fatigue;Patient limited by lethargy;Treatment limited secondary to medical complications (Comment)  Patient left in chair;with call bell/phone within reach  Nurse Communication Mobility status   PT - Assessment/Plan  PT Plan Discharge plan needs to be updated  PT Visit Diagnosis Other abnormalities of gait and mobility (R26.89);Muscle weakness (generalized) (M62.81)  PT Frequency (ACUTE ONLY) Min 3X/week  Recommendations for Other Services Rehab consult  Follow Up Recommendations SNF;CIR  PT equipment None recommended by PT  AM-PAC PT "6 Clicks" Mobility Outcome Measure (Version 2)  Help needed turning from your back to your side while in a flat bed without using bedrails? 3  Help needed moving from lying on your back to sitting on the side of a flat bed without using bedrails? 3  Help needed moving to and from a bed to a chair (including a wheelchair)? 3  Help needed standing up from a chair using your arms (e.g., wheelchair or bedside chair)? 3  Help needed to walk in hospital room?  2  Help needed climbing 3-5 steps with a railing?  1  6 Click Score 15  Consider Recommendation of Discharge To:  CIR/SNF/LTACH  PT Goal Progression  Progress towards PT goals Not progressing toward goals - comment  Acute Rehab PT Goals  PT Goal Formulation With patient  Time For Goal Achievement 02/07/20  Potential to Achieve Goals Fair  PT Time Calculation  PT Start Time (ACUTE ONLY) 1117  PT Stop Time (ACUTE ONLY) 1139  PT Time Calculation (min) (ACUTE ONLY) 22 min  PT General Charges  $$ ACUTE PT VISIT 1 Visit  PT Treatments  $Therapeutic Exercise 8-22 mins

## 2020-02-02 NOTE — Progress Notes (Signed)
Patient called this nurse to room reporting new blurred vision to his left eye. He reports its just "light, I dont see your face."  A full vision check was performed and the eye was flushed with normal saline.  Stroke assessment performed and was negative. This RN assessed with a flashlight and didn't see any debris.  Vitals were obtained and were normal.  Eye was flushed again with no resolution.  This nurse then notified MD who then asked for a blood glucose check.  Blood sugar was 100. Patient then reports "its getting better."  This Rn flushed eye again and told the patient to keep it closed for a few minutes. This RN then relayed the update to the MD.  Patient then reports after another few minutes, " Its better now. I think it was a piece of toilet paper."  Vision is now back to baseline.   MD notified. Will continue to monitor.

## 2020-02-02 NOTE — Progress Notes (Signed)
Physical Therapy Treatment Patient Details Name: Anthony Morrison MRN: [id number] DOB: 1941-09-24 Today's Date: 02/02/2020    History of Present Illness Anthony Morrison is a 79 y.o. male with medical history significant for emphysema, coronary artery disease status post CABG, and chronic kidney disease stage II-III who was diagnosed with COVID-19 on 01/10/2020 (visible in [place]), was started on systemic steroid and antibiotic in the outpatient setting, but continued to worsen and presented to the emergency department on 01/22/2020. He developed general malaise and cough ~1/15 and went on to develop some dyspnea and sputum production, but has not had any chest pain, abdominal pain, or leg swelling or tenderness.    PT Comments    Pt demonstrated reduced functional mobility today. He was able to tolerate reduced oxygen overall to 2L/min [place] at rest. He required >8 min recovery following exercises/transfers before being able to perform other physical activity. He demonstrated much reduced bed mobility (min-modA), transfer ability (ModA), and ambulation quality(modA & cruising).  He expressed a lot of anxiety of discharge planning, requesting CM contact his son [name] at [phone]. Overall due to recent decompensation of strength and reduced functional mobility I recommended patient consider SNF rehabilitation to improve home safety prior to return to home. Pt agreed and asked to coordinate with his son. Overall patient demonstrated reduced activity tolerance, reduced ambulation safety and ability, reduced gait quality, reduced knowledge of self management and could benefit from continued skilled therapy.  Follow Up Recommendations  SNF;CIR     Equipment Recommendations  None recommended by PT    Recommendations for Other Services Rehab consult     Precautions / Restrictions Precautions Precautions: Fall;Other (comment) Precaution Comments: monitor 02 sats desats with  activity Restrictions Weight Bearing Restrictions: No    Mobility  Bed Mobility Overal bed mobility: Needs Assistance Bed Mobility: Supine to Sit     Supine to sit: Mod assist;HOB elevated Sit to supine: (Not observed, left OOB in chair)      Transfers Overall transfer level: Needs assistance Equipment used: (Pt cruised along bed and to chair with forward leaning postu) Transfers: Sit to/from [facility] Sit to Stand: Min guard Stand pivot transfers: Mod assist          Ambulation/Gait Ambulation/Gait assistance: Mod assist Gait Distance (Feet): 5 Feet Assistive device: 1 person hand held assist;None(Cruising along furniture in room) Gait Pattern/deviations: Shuffle;Decreased stride length;Step-to pattern;Trunk flexed;Wide base of support     General Gait Details: Pt demonstrated improved tolerance on reduced O2 2 L, but was very unsteady standing, refused to walk further due to activity tolerance limitations and food arriving.    Stairs             Wheelchair Mobility    Modified Rankin (Stroke Patients Only)       Balance Overall balance assessment: Needs assistance Sitting-balance support: Feet supported Sitting balance-Leahy Scale: Good     Standing balance support: During functional activity;Bilateral upper extremity supported Standing balance-Leahy Scale: Poor Standing balance comment: Pt was unable to maintain upright balance today without cruising on furniture, required assistance for line management and bed mobility prior.                            Cognition Arousal/Alertness: Awake/alert Behavior During Therapy: WFL for tasks assessed/performed Overall Cognitive Status: Within Functional Limits for tasks assessed  General Comments: Pt states worse thing is his left hand numbness along medial 2.5 fingers.      Exercises General Exercises - Lower Extremity Ankle  Circles/Pumps: AROM;Both;10 reps Long Arc Quad: AROM;Both;10 reps;Seated Hip Flexion/Marching: AROM;10 reps;Both;Seated    General Comments General comments (skin integrity, edema, etc.): Attempted ambulation and transfers. Pt verbalized understanding. States he has all needed DME.      Pertinent Vitals/Pain Pain Assessment: No/denies pain    Home Living                      Prior Function            PT Goals (current goals can now be found in the care plan section) Acute Rehab PT Goals Patient Stated Goal: Wants to return to a safe environment, open to therapy due to spouse being sick at home. PT Goal Formulation: With patient Time For Goal Achievement: 02/07/20 Potential to Achieve Goals: Fair Progress towards PT goals: Not progressing toward goals - comment(Pt appears to require more assistance due to decompensation)    Frequency    Min 3X/week      PT Plan Discharge plan needs to be updated    Co-evaluation              AM-PAC PT "6 Clicks" Mobility   Outcome Measure  Help needed turning from your back to your side while in a flat bed without using bedrails?: A Little Help needed moving from lying on your back to sitting on the side of a flat bed without using bedrails?: A Little Help needed moving to and from a bed to a chair (including a wheelchair)?: A Little Help needed standing up from a chair using your arms (e.g., wheelchair or bedside chair)?: A Little Help needed to walk in hospital room?: A Lot Help needed climbing 3-5 steps with a railing? : Total 6 Click Score: 15    End of Session Equipment Utilized During Treatment: Oxygen Activity Tolerance: Patient limited by fatigue;Patient limited by lethargy;Treatment limited secondary to medical complications (Comment) Patient left: in chair;with call bell/phone within reach Nurse Communication: Mobility status PT Visit Diagnosis: Other abnormalities of gait and mobility (R26.89);Muscle  weakness (generalized) (M62.81)     Time:  8144-8185 PT Time Calculation (min) (ACUTE ONLY): 15 min  Charges:  $Therapeutic Exercise: 8-22 mins $Therapeutic Activity: 8-22 mins                     Ann Held PT, DPT Acute [place] P: [phone]   Dilynn Munroe A Zack Crager 02/02/2020, 1:24 PM

## 2020-02-02 NOTE — Progress Notes (Signed)
PROGRESS NOTE  Anthony Morrison SEL:[id number] DOB: [birth year] DOA: 01/24/2020 PCP: Rusty Aus, MD  HPI/Recap of past 68 hours: 79 year old male with past medical history of stage III chronic kidney disease, CAD status post CABG and COPD diagnosed with Covid on 1/19 and presented to the emergency room on 1/31 with progressively worsening shortness of breath and found to be hypoxic as well as have sepsis from Covid/community-acquired pneumonia, so admitted to the hospitalist service.  Patient treated with IV Actemra, Remdisivir and steroids and since then, oxygenation has improved although he still needs 2 to 5 L depending on exertion to keep oxygen levels above 90%.  Patient's hospital course has also been complicated by discovery of bilateral DVT and started on heparin, transition to Eliquis.  This was further complicated by reports of dark bloody bowel movement on 2/6, although interestingly, his hemoglobin has not changed at all since admission.  Patient also reports difficulty with left hand grip strength and numbness in his fourth and fifth fingers.  A CT scan of head was unremarkable on 2/9.  Patient states that this started when he was first admitted on 1/31 and has remained persistent.    Today, patient doing about the same.  He had a brief episode where he was complaining of some right eye blurry vision but after his nurse flushed his eye out, his symptoms resolved and he felt like he had just gotten something in it.  Given his persistent weakness, seen by PT who are recommending short-term skilled nursing  Assessment/Plan: Principal Problem:   Acute hypoxemic respiratory failure due to COVID-19 [facility]) with history of underlying COPD/emphysema: Status post Actemra and remdesivir.  Still on steroids.  Still requiring fair amount of oxygen.  Will need short-term skilled nursing. Active Problems: CAD S/P CABG x 3: Stable.  Elevated troponins felt to be secondary to respiratory dysfunction     Benign essential HTN: Blood pressure stable.  Blurry vision: Initially concerning for something neurovascular, but just appears to be a foreign object, now resolved.    Hyperlipidemia, mixed: Continue statin    Acute renal failure superimposed on stage 2 chronic kidney disease ([place]): Secondary to initial sepsis.  Renal function close to baseline.    Sepsis ([place]) secondary to Covid pneumonia/CAP: Patient met criteria for sepsis on admission given leukocytosis, acute kidney injury and lactic acidosis along with tachycardia and pulmonary source.  Elevated procalcitonin level of 2.21 as well.  Treated with IV Rocephin and Zithromax of which she has completed the full course of.  Lactic acidosis resolved with aggressive fluid resuscitation and sepsis felt to be stabilized.    Acute deep vein thrombosis (DVT) of lower extremity ([place]): Appears to be tolerating Eliquis well.  Bloody BM: No evidence of acute bleeding.  Watching hemoglobin very closely given that he is on Eliquis.  No further episodes.    Severe protein-calorie malnutrition [facility]): Patient meets criteria as evidenced by energy intake less than or equal to 50% for more than 5 days as well as 10% weight loss within a week.  Seen by nutrition and put on Hormel shake daily with breakfast plus Magic cup twice daily and Ensure Enlive 3 times daily.  BMI of 23.    Ulnar neuropathy of left upper extremity: No evidence of acute CVA.  We will set up outpatient MRI.  Discussed with neurology and very unlikely this is an ischemic event.  Possible nerve impingement.   Code Status: Partial code  Family Communication: Son updated by  phone   Disposition Plan: Potential discharge tomorrow to short-term skilled nursing if bed available.  Consultants:  None  Procedures:  Lower extremity Doppler done 2/4+ for bilateral DVT  Antimicrobials:  IV Rocephin and Zithromax 1/31-2/4  IV Remdisivir 2/1-2/5  DVT prophylaxis: Eliquis    Objective: Vitals:   02/02/20 0718 02/02/20 1410  BP:  119/63  Pulse:  60  Resp:    Temp:  97.8 F (36.6 C)  SpO2: 94% 94%    Intake/Output Summary (Last 24 hours) at 02/02/2020 1518 Last data filed at 02/01/2020 1921 Gross per 24 hour  Intake -  Output 0 ml  Net 0 ml   Filed Weights   01/24/20 0200 01/30/20 0500  Weight: 72.7 kg 64.9 kg   Body mass index is 23.08 kg/m.  Exam:   General: Alert and oriented x3, no acute distress  HEENT: Normocephalic and atraumatic, mucous membranes are moist  Cardiovascular: Regular rate and rhythm, S1-S2  Respiratory: Decreased breath sounds throughout  Abdomen: Soft, nontender, nondistended, positive bowel sounds  Musculoskeletal: No clubbing or cyanosis or edema  Skin: No skin breaks, tears or lesions  Psychiatry: Appropriate, no evidence of psychoses  Neuro: Continue decreased sensation left hand notably in the left fifth finger with decreased grip at approximately 3+/5 as compared to the right   Data Reviewed: CBC: Recent Labs  Lab 01/29/20 0605 01/29/20 1335 01/29/20 2240 01/30/20 0600 01/31/20 0440 02/01/20 0550 02/02/20 0510  WBC 15.7*  --   --  15.0* 12.9* 11.0* 10.0  NEUTROABS 12.9*  --   --  12.6* 10.8* 8.9* 7.9*  HGB 14.4  14.1   < > 15.1 14.6  14.8 14.2 14.2 15.2  HCT 41.8  42.1   < > 45.7 43.3  43.6 43.6 42.6 45.3  MCV 92.5  --   --  92.7 94.0 92.4 93.8  PLT 173  --   --  178 219 185 165   < > = values in this interval not displayed.   Basic Metabolic Panel: Recent Labs  Lab 01/29/20 0605 01/30/20 0600 01/31/20 0440 02/01/20 0550 02/02/20 0510  NA 137 138 137 141 137  K 4.7 4.2 4.2 4.5 4.5  CL 108 109 108 111 108  CO2 20* 20* 21* 23 19*  GLUCOSE 83 96 94 90 82  BUN 35* 40* 39* 39* 42*  CREATININE 1.16 1.14 1.00 1.19 1.15  CALCIUM 8.0* 8.1* 8.1* 8.2* 8.1*  MG 2.3 2.3 2.4 2.3 2.3  PHOS 3.3 3.6 2.9 3.3 3.5   GFR: Estimated Creatinine Clearance: 47.8 mL/min (by C-G formula based on SCr  of 1.15 mg/dL). Liver Function Tests: Recent Labs  Lab 01/29/20 0605 01/30/20 0600 01/31/20 0440 02/01/20 0550 02/02/20 0510  AST 30 28 24 21 24   ALT 51* 47* 40 35 34  ALKPHOS 74 73 75 60 67  BILITOT 0.9 0.8 0.7 1.0 1.1  PROT 5.6* 5.6* 5.7* 5.5* 5.6*  ALBUMIN 2.9* 3.1* 3.2* 3.1* 3.1*   No results for input(s): LIPASE, AMYLASE in the last 168 hours. No results for input(s): AMMONIA in the last 168 hours. Coagulation Profile: No results for input(s): INR, PROTIME in the last 168 hours. Cardiac Enzymes: No results for input(s): CKTOTAL, CKMB, CKMBINDEX, TROPONINI in the last 168 hours. BNP (last 3 results) No results for input(s): PROBNP in the last 8760 hours. HbA1C: No results for input(s): HGBA1C in the last 72 hours. CBG: No results for input(s): GLUCAP in the last 168 hours. Lipid Profile: No results  for input(s): CHOL, HDL, LDLCALC, TRIG, CHOLHDL, LDLDIRECT in the last 72 hours. Thyroid Function Tests: No results for input(s): TSH, T4TOTAL, FREET4, T3FREE, THYROIDAB in the last 72 hours. Anemia Panel: Recent Labs    02/01/20 0550 02/02/20 0510  FERRITIN 516* 484*   Urine analysis: No results found for: COLORURINE, APPEARANCEUR, LABSPEC, PHURINE, GLUCOSEU, HGBUR, BILIRUBINUR, KETONESUR, PROTEINUR, UROBILINOGEN, NITRITE, LEUKOCYTESUR Sepsis Labs: @LABRCNTIP (procalcitonin:4,lacticidven:4)  ) Recent Results (from the past 240 hour(s))  Culture, sputum-assessment     Status: None   Collection Time: 01/24/20  2:22 AM   Specimen: Expectorated Sputum  Result Value Ref Range Status   Specimen Description EXPECTORATED SPUTUM  Final   Special Requests NONE  Final   Sputum evaluation   Final    THIS SPECIMEN IS ACCEPTABLE FOR SPUTUM CULTURE Performed at [facility], [place] [address], [place], [place] [address]    Report Status 01/24/2020 FINAL  Final  Culture, respiratory     Status: None   Collection Time: 01/24/20  2:22 AM  Result Value Ref Range  Status   Specimen Description   Final    EXPECTORATED SPUTUM Performed at [place] [address], [place], [place] [address]    Special Requests   Final    NONE Reflexed from [id number] Performed at [facility], [place] [address], [place], [place] [address]    Gram Stain   Final    FEW WBC PRESENT,BOTH PMN AND MONONUCLEAR MODERATE SQUAMOUS EPITHELIAL CELLS PRESENT MODERATE GRAM POSITIVE COCCI IN PAIRS MODERATE GRAM POSITIVE RODS FEW GRAM NEGATIVE RODS RARE BUDDING YEAST SEEN    Culture   Final    FEW Consistent with normal respiratory flora. Performed at [facility], [place] [address] [place], [place] [address]    Report Status 01/26/2020 FINAL  Final      Studies: No results found.  Scheduled Meds: . apixaban  10 mg Oral BID   Followed by  . [START ON 02/03/2020] apixaban  5 mg Oral BID  . vitamin C  500 mg Oral Daily  . feeding supplement (ENSURE ENLIVE)  237 mL Oral TID BM  . Ipratropium-Albuterol  1 puff Inhalation QID  . lisinopril  10 mg Oral Daily  . metoprolol tartrate  50 mg Oral BID  . mometasone-formoterol  2 puff Inhalation BID  . pantoprazole  40 mg Oral BID  . rosuvastatin  10 mg Oral q1800  . sodium chloride flush  3 mL Intravenous Q12H  . sodium chloride flush  3 mL Intravenous Q12H  . zinc sulfate  220 mg Oral Daily    Continuous Infusions: . sodium chloride    . sodium chloride 50 mL/hr at 02/02/20 1013     LOS: 9 days     Annita Brod, MD [facility]   02/02/2020, 3:18 PM

## 2020-02-02 NOTE — NC FL2 (Addendum)
[place] LEVEL OF CARE SCREENING TOOL     IDENTIFICATION  Patient Name: Anthony Morrison Birthdate: August 23, 1941 Sex: male Admission Date (Current Location): 01/24/2020  [facility] and [place] Number:  Herbalist and Address:  [facility],  [place] [address] [place] [address](patient is in [employer])      Provider Number: [id number]  Attending Physician Name and Address:  Annita Brod, MD  Relative Name and Phone Number:       Current Level of Care: Hospital Recommended Level of Care: [place] Prior Approval Number:    Date Approved/Denied:   PASRR Number: [id number]  Discharge Plan: SNF    Current Diagnoses: Patient Active Problem List   Diagnosis Date Noted  . Ulnar neuropathy of left upper extremity 02/01/2020  . Severe protein-calorie malnutrition ([place]) 01/31/2020  . Upper GI bleed 01/28/2020  . Elevated d-dimer 01/26/2020  . Acute deep vein thrombosis (DVT) of lower extremity ([place]) 01/26/2020  . Pneumonia due to COVID-19 virus 01/25/2020  . COPD (chronic obstructive pulmonary disease) ([place])   . Acute hypoxemic respiratory failure due to COVID-19 ([place]) 01/23/2020  . COVID-19 virus infection 01/22/2020  . CAP (community acquired pneumonia) 01/22/2020  . Acute renal failure superimposed on stage 3 chronic kidney disease ([place]) 01/22/2020  . Elevated troponin 01/22/2020  . Sepsis ([place]) 01/22/2020  . Hypotension 01/22/2020  . 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  . Atherosclerosis of native arteries of extremity with intermittent claudication ([place]) 02/28/2019  . Carotid stenosis, asymptomatic, bilateral 02/21/2019  . PAD (peripheral artery disease) ([place]) 02/21/2019  . Bilateral carotid artery stenosis 02/01/2019  . PSVT (paroxysmal supraventricular tachycardia) ([place]) 01/31/2019  . ASCVD (arteriosclerotic cardiovascular disease) 01/25/2019  . S/P CABG x 3 01/14/2019  . Stable angina ([place]) 01/12/2019  . Benign  essential HTN 01/10/2019  . Umbilical hernia without obstruction and without gangrene 12/27/2018  . Hyperlipidemia, mixed 03/25/2018  . Medicare annual wellness visit, initial 03/24/2017  . 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 Acute respiratory failure with hypoxia ([place]) 01/13/2017  . B12 deficiency 03/18/2016    Orientation RESPIRATION BLADDER Height & Weight     Self, Time, Situation, Place  O2(3l/m in via [place]) Continent Weight: 64.9 kg Height:  5\' 6"  (167.6 cm)  BEHAVIORAL SYMPTOMS/MOOD NEUROLOGICAL BOWEL NUTRITION STATUS      Continent Diet(regular)  AMBULATORY STATUS COMMUNICATION OF NEEDS Skin   Extensive Assist Verbally Normal                       Personal Care Assistance Level of Assistance  Bathing, Feeding, Dressing Bathing Assistance: Limited assistance Feeding assistance: Independent Dressing Assistance: Limited assistance     Functional Limitations Info  Sight, Speech, Hearing Sight Info: Adequate Hearing Info: Adequate Speech Info: Adequate    SPECIAL CARE FACTORS FREQUENCY  PT (By licensed PT)     PT Frequency: 5 x weekly              Contractures Contractures Info: Not present    Additional Factors Info  Code Status Code Status Info: partial             Current Medications (02/02/2020):  This is the current hospital active medication list Current Facility-Administered Medications  Medication Dose Route Frequency Provider Last Rate Last Admin  . 0.9 %  sodium chloride infusion  250 mL Intravenous PRN Opyd, Ilene Qua, MD      . 0.9 %  sodium chloride infusion   Intravenous  Continuous Annita Brod, MD 50 mL/hr at 02/02/20 1013 New Bag at 02/02/20 1013  . acetaminophen (TYLENOL) tablet 650 mg  650 mg Oral Q6H PRN Opyd, Ilene Qua, MD      . apixaban (ELIQUIS) tablet 10 mg  10 mg Oral BID Alvira Philips, RPH   10 mg at 02/02/20 0835   Followed by  . [START ON 02/03/2020] apixaban (ELIQUIS) tablet 5 mg  5 mg Oral BID Alvira Philips, [place]      . ascorbic acid (VITAMIN C) tablet 500 mg  500 mg Oral Daily Allie Bossier, MD   500 mg at 02/02/20 0834  . bisacodyl (DULCOLAX) EC tablet 5 mg  5 mg Oral Daily PRN Opyd, Ilene Qua, MD      . feeding supplement (ENSURE ENLIVE) (ENSURE ENLIVE) liquid 237 mL  237 mL Oral TID BM Allie Bossier, MD      . guaiFENesin-dextromethorphan (ROBITUSSIN DM) 100-10 MG/5ML syrup 10 mL  10 mL Oral Q4H PRN Opyd, Ilene Qua, MD   10 mL at 02/01/20 0135  . hydrALAZINE (APRESOLINE) injection 5 mg  5 mg Intravenous Q4H PRN Allie Bossier, MD      . Ipratropium-Albuterol (COMBIVENT) respimat 1 puff  1 puff Inhalation QID Allie Bossier, MD   1 puff at 02/01/20 1733  . lisinopril (ZESTRIL) tablet 10 mg  10 mg Oral Daily Opyd, Ilene Qua, MD   10 mg at 02/01/20 0952  . LORazepam (ATIVAN) tablet 0.5 mg  0.5 mg Oral Q6H PRN Opyd, Ilene Qua, MD   0.5 mg at 01/29/20 2133  . magic mouthwash  5 mL Oral QID PRN Opyd, Ilene Qua, MD   5 mL at 02/02/20 1011  . metoprolol tartrate (LOPRESSOR) tablet 50 mg  50 mg Oral BID Opyd, Ilene Qua, MD   50 mg at 02/02/20 0834  . mometasone-formoterol (DULERA) 200-5 MCG/ACT inhaler 2 puff  2 puff Inhalation BID Opyd, Ilene Qua, MD   2 puff at 02/01/20 0806  . ondansetron (ZOFRAN) tablet 4 mg  4 mg Oral Q6H PRN Opyd, Ilene Qua, MD       Or  . ondansetron (ZOFRAN) injection 4 mg  4 mg Intravenous Q6H PRN Opyd, Ilene Qua, MD      . pantoprazole (PROTONIX) EC tablet 40 mg  40 mg Oral BID Annita Brod, MD   40 mg at 02/02/20 0835  . rosuvastatin (CRESTOR) tablet 10 mg  10 mg Oral q1800 Opyd, Ilene Qua, MD   10 mg at 02/01/20 1732  . senna-docusate (Senokot-S) tablet 1 tablet  1 tablet Oral QHS PRN Opyd, Ilene Qua, MD      . sodium chloride (OCEAN) 0.65 % nasal spray 2 spray  2 spray Each Nare PRN Allie Bossier, MD   2 spray at 01/25/20 1739  . sodium chloride flush (NS) 0.9 % injection 3 mL  3 mL Intravenous Q12H Opyd, Ilene Qua, MD   3 mL at 02/02/20 0838  .  sodium chloride flush (NS) 0.9 % injection 3 mL  3 mL Intravenous Q12H Opyd, Ilene Qua, MD   3 mL at 02/02/20 0838  . sodium chloride flush (NS) 0.9 % injection 3 mL  3 mL Intravenous PRN Opyd, Ilene Qua, MD   3 mL at 01/30/20 1621  . zinc sulfate capsule 220 mg  220 mg Oral Daily Allie Bossier, MD   220 mg at 02/02/20 1287     Discharge Medications: Please  see discharge summary for a list of discharge medications.  Relevant Imaging Results:  Relevant Lab Results:   Additional Information [id number]  Leeroy Cha, RN

## 2020-02-02 NOTE — TOC Initial Note (Addendum)
Transition of Care [facility]) - Initial/Assessment Note    Patient Details  Name: Anthony Morrison MRN: [id number] Date of Birth: 05-20-1941  [facility] CM/SW Contact:    Leeroy Cha, RN Phone Number: 02/02/2020, 2:26 PM  Clinical Narrative:                 P[lasn for dc is either CIR or snf md leaning more towards snf placment.  fl2 sent out to area snfs for review. tct-son to see which facilty to use [facility] or [facility] son states he will go to [facility] of these.  Wants him to go to [facility] which an assisted living facilty with rehab.  DR. [place] Pink notified.   Expected Discharge Plan: Skilled Nursing Facility Barriers to Discharge: SNF Pending bed offer   Patient Goals and CMS Choice Patient states their goals for this hospitalization and ongoing recovery are:: toi goi home CMS Medicare.gov Compare Post Acute Care list provided to:: Patient Choice offered to / list presented to : Patient, Adult Children  Expected Discharge Plan and Services Expected Discharge Plan: [place] [facility]   Discharge Planning Services: CM Consult Post Acute Care Choice: [place] Living arrangements for the past 2 months: Single Family Home                                      Prior Living Arrangements/Services Living arrangements for the past 2 months: Single Family Home Lives with:: Self Patient language and need for interpreter reviewed:: No Do you feel safe going back to the place where you live?: Yes      Need for Family Participation in Patient Care: Yes (Comment) Care giver support system in place?: Yes (comment)   Criminal Activity/Legal Involvement Pertinent to Current Situation/Hospitalization: No - Comment as needed  Activities of Daily Living Home Assistive Devices/Equipment: None ADL Screening (condition at time of admission) Patient's cognitive ability adequate to safely complete daily activities?:  Yes Is the patient deaf or have difficulty hearing?: No Does the patient have difficulty seeing, even when wearing glasses/contacts?: No Does the patient have difficulty concentrating, remembering, or making decisions?: No Patient able to express need for assistance with ADLs?: Yes Does the patient have difficulty dressing or bathing?: No Independently performs ADLs?: Yes (appropriate for developmental age) Does the patient have difficulty walking or climbing stairs?: No Weakness of Legs: None Weakness of Arms/Hands: None  Permission Sought/Granted   Permission granted to share information with : Yes, Verbal Permission Granted              Emotional Assessment Appearance:: Appears stated age     Orientation: : Oriented to Self, Oriented to Place, Oriented to  Time, Oriented to Situation Alcohol / Substance Use: Not Applicable Psych Involvement: No (comment)  Admission diagnosis:  Acute hypoxemic respiratory failure due to COVID-19 ([place]) [U07.1, J96.01] Patient Active Problem List   Diagnosis Date Noted  . Ulnar neuropathy of left upper extremity 02/01/2020  . Severe protein-calorie malnutrition ([place]) 01/31/2020  . Upper GI bleed 01/28/2020  . Elevated d-dimer 01/26/2020  . Acute deep vein thrombosis (DVT) of lower extremity ([place]) 01/26/2020  . Pneumonia due to COVID-19 virus 01/25/2020  . COPD (chronic obstructive pulmonary disease) ([place])   . Acute hypoxemic respiratory failure due to COVID-19 ([place]) 01/23/2020  . COVID-19 virus infection 01/22/2020  . CAP (community acquired pneumonia)  01/22/2020  . Acute renal failure superimposed on stage 3 chronic kidney disease ([place]) 01/22/2020  . Elevated troponin 01/22/2020  . Sepsis ([place]) 01/22/2020  . Hypotension 01/22/2020  . 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  . Atherosclerosis of native arteries of extremity with intermittent claudication ([place]) 02/28/2019  . Carotid stenosis, asymptomatic, bilateral 02/21/2019  . PAD (peripheral artery  disease) ([place]) 02/21/2019  . Bilateral carotid artery stenosis 02/01/2019  . PSVT (paroxysmal supraventricular tachycardia) ([place]) 01/31/2019  . ASCVD (arteriosclerotic cardiovascular disease) 01/25/2019  . S/P CABG x 3 01/14/2019  . Stable angina ([place]) 01/12/2019  . Benign essential HTN 01/10/2019  . Umbilical hernia without obstruction and without gangrene 12/27/2018  . Hyperlipidemia, mixed 03/25/2018  . Medicare annual wellness visit, initial 03/24/2017  . 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 Acute respiratory failure with hypoxia ([place]) 01/13/2017  . B12 deficiency 03/18/2016   PCP:  Rusty Aus, MD Pharmacy:   [facility], [place] [address] [address] Phone: [phone] Fax: [place] ([place]), [place] [address] Phone: [phone] Fax: [phone]     Social Determinants of Health (SDOH) Interventions    Readmission Risk Interventions No flowsheet data found.

## 2020-02-03 DIAGNOSIS — I82452 Acute embolism and thrombosis of left peroneal vein: Secondary | ICD-10-CM | POA: Diagnosis not present

## 2020-02-03 DIAGNOSIS — I10 Essential (primary) hypertension: Secondary | ICD-10-CM | POA: Diagnosis not present

## 2020-02-03 DIAGNOSIS — Z87891 Personal history of nicotine dependence: Secondary | ICD-10-CM | POA: Diagnosis not present

## 2020-02-03 DIAGNOSIS — E538 Deficiency of other specified B group vitamins: Secondary | ICD-10-CM | POA: Diagnosis not present

## 2020-02-03 DIAGNOSIS — I129 Hypertensive chronic kidney disease with stage 1 through stage 4 chronic kidney disease, or unspecified chronic kidney disease: Secondary | ICD-10-CM | POA: Diagnosis not present

## 2020-02-03 DIAGNOSIS — J431 Panlobular emphysema: Secondary | ICD-10-CM | POA: Diagnosis not present

## 2020-02-03 DIAGNOSIS — Z9981 Dependence on supplemental oxygen: Secondary | ICD-10-CM | POA: Diagnosis not present

## 2020-02-03 DIAGNOSIS — U071 COVID-19: Secondary | ICD-10-CM | POA: Diagnosis not present

## 2020-02-03 DIAGNOSIS — I471 Supraventricular tachycardia: Secondary | ICD-10-CM | POA: Diagnosis not present

## 2020-02-03 DIAGNOSIS — I82403 Acute embolism and thrombosis of unspecified deep veins of lower extremity, bilateral: Secondary | ICD-10-CM | POA: Diagnosis not present

## 2020-02-03 DIAGNOSIS — E782 Mixed hyperlipidemia: Secondary | ICD-10-CM | POA: Diagnosis not present

## 2020-02-03 DIAGNOSIS — N170 Acute kidney failure with tubular necrosis: Secondary | ICD-10-CM | POA: Diagnosis not present

## 2020-02-03 DIAGNOSIS — J189 Pneumonia, unspecified organism: Secondary | ICD-10-CM | POA: Diagnosis not present

## 2020-02-03 DIAGNOSIS — B370 Candidal stomatitis: Secondary | ICD-10-CM | POA: Diagnosis not present

## 2020-02-03 DIAGNOSIS — G5622 Lesion of ulnar nerve, left upper limb: Secondary | ICD-10-CM | POA: Diagnosis not present

## 2020-02-03 DIAGNOSIS — J9600 Acute respiratory failure, unspecified whether with hypoxia or hypercapnia: Secondary | ICD-10-CM | POA: Diagnosis not present

## 2020-02-03 DIAGNOSIS — J9601 Acute respiratory failure with hypoxia: Secondary | ICD-10-CM | POA: Diagnosis not present

## 2020-02-03 DIAGNOSIS — Z7401 Bed confinement status: Secondary | ICD-10-CM | POA: Diagnosis not present

## 2020-02-03 DIAGNOSIS — I6523 Occlusion and stenosis of bilateral carotid arteries: Secondary | ICD-10-CM | POA: Diagnosis not present

## 2020-02-03 DIAGNOSIS — I70219 Atherosclerosis of native arteries of extremities with intermittent claudication, unspecified extremity: Secondary | ICD-10-CM | POA: Diagnosis not present

## 2020-02-03 DIAGNOSIS — I82441 Acute embolism and thrombosis of right tibial vein: Secondary | ICD-10-CM | POA: Diagnosis not present

## 2020-02-03 DIAGNOSIS — I2510 Atherosclerotic heart disease of native coronary artery without angina pectoris: Secondary | ICD-10-CM | POA: Diagnosis not present

## 2020-02-03 DIAGNOSIS — I82442 Acute embolism and thrombosis of left tibial vein: Secondary | ICD-10-CM | POA: Diagnosis not present

## 2020-02-03 DIAGNOSIS — M2550 Pain in unspecified joint: Secondary | ICD-10-CM | POA: Diagnosis not present

## 2020-02-03 DIAGNOSIS — I739 Peripheral vascular disease, unspecified: Secondary | ICD-10-CM | POA: Diagnosis not present

## 2020-02-03 DIAGNOSIS — J432 Centrilobular emphysema: Secondary | ICD-10-CM | POA: Diagnosis not present

## 2020-02-03 DIAGNOSIS — N183 Chronic kidney disease, stage 3 unspecified: Secondary | ICD-10-CM | POA: Diagnosis not present

## 2020-02-03 DIAGNOSIS — E43 Unspecified severe protein-calorie malnutrition: Secondary | ICD-10-CM | POA: Diagnosis not present

## 2020-02-03 DIAGNOSIS — N1831 Chronic kidney disease, stage 3a: Secondary | ICD-10-CM | POA: Diagnosis not present

## 2020-02-03 DIAGNOSIS — J1282 Pneumonia due to coronavirus disease 2019: Secondary | ICD-10-CM | POA: Diagnosis not present

## 2020-02-03 DIAGNOSIS — Z951 Presence of aortocoronary bypass graft: Secondary | ICD-10-CM | POA: Diagnosis not present

## 2020-02-03 LAB — CBC WITH DIFFERENTIAL/PLATELET
Abs Immature Granulocytes: 0.07 10*3/uL (ref 0.00–0.07)
Basophils Absolute: 0 10*3/uL (ref 0.0–0.1)
Basophils Relative: 0 %
Eosinophils Absolute: 0 10*3/uL (ref 0.0–0.5)
Eosinophils Relative: 1 %
HCT: 46.7 % (ref 39.0–52.0)
Hemoglobin: 15.4 g/dL (ref 13.0–17.0)
Immature Granulocytes: 1 %
Lymphocytes Relative: 12 %
Lymphs Abs: 1 10*3/uL (ref 0.7–4.0)
MCH: 30.9 pg (ref 26.0–34.0)
MCHC: 33 g/dL (ref 30.0–36.0)
MCV: 93.8 fL (ref 80.0–100.0)
Monocytes Absolute: 0.6 10*3/uL (ref 0.1–1.0)
Monocytes Relative: 7 %
Neutro Abs: 6.7 10*3/uL (ref 1.7–7.7)
Neutrophils Relative %: 79 %
Platelets: 168 10*3/uL (ref 150–400)
RBC: 4.98 MIL/uL (ref 4.22–5.81)
RDW: 13.6 % (ref 11.5–15.5)
WBC: 8.5 10*3/uL (ref 4.0–10.5)
nRBC: 0 % (ref 0.0–0.2)

## 2020-02-03 LAB — MAGNESIUM: Magnesium: 2.4 mg/dL (ref 1.7–2.4)

## 2020-02-03 LAB — COMPREHENSIVE METABOLIC PANEL
ALT: 31 U/L (ref 0–44)
AST: 23 U/L (ref 15–41)
Albumin: 3.4 g/dL — ABNORMAL LOW (ref 3.5–5.0)
Alkaline Phosphatase: 68 U/L (ref 38–126)
Anion gap: 8 (ref 5–15)
BUN: 37 mg/dL — ABNORMAL HIGH (ref 8–23)
CO2: 22 mmol/L (ref 22–32)
Calcium: 8.3 mg/dL — ABNORMAL LOW (ref 8.9–10.3)
Chloride: 106 mmol/L (ref 98–111)
Creatinine, Ser: 1.15 mg/dL (ref 0.61–1.24)
GFR calc Af Amer: >60 mL/min (ref >60)
GFR calc non Af Amer: >60 mL/min (ref >60)
Glucose, Bld: 84 mg/dL (ref 70–99)
Potassium: 4.4 mmol/L (ref 3.5–5.1)
Sodium: 136 mmol/L (ref 135–145)
Total Bilirubin: 1.1 mg/dL (ref 0.3–1.2)
Total Protein: 5.9 g/dL — ABNORMAL LOW (ref 6.5–8.1)

## 2020-02-03 LAB — D-DIMER, QUANTITATIVE: D-Dimer, Quant: 3.47 ug/mL-FEU — ABNORMAL HIGH (ref 0.00–0.50)

## 2020-02-03 LAB — C-REACTIVE PROTEIN: CRP: 0.5 mg/dL (ref <1.0)

## 2020-02-03 LAB — FERRITIN: Ferritin: 526 ng/mL — ABNORMAL HIGH (ref 24–336)

## 2020-02-03 LAB — PHOSPHORUS: Phosphorus: 2.9 mg/dL (ref 2.5–4.6)

## 2020-02-03 MED ORDER — LISINOPRIL 10 MG PO TABS: 10.0000 mg | ORAL_TABLET | Freq: Every day | ORAL | 1 refills | Status: DC

## 2020-02-03 MED ORDER — ACETAMINOPHEN 325 MG PO TABS: 650.0000 mg | ORAL_TABLET | Freq: Four times a day (QID) | ORAL | Status: DC | PRN

## 2020-02-03 MED ORDER — GUAIFENESIN-DM 100-10 MG/5ML PO SYRP: 10.0000 mL | ORAL_SOLUTION | ORAL | 0 refills | Status: DC | PRN

## 2020-02-03 MED ORDER — ENSURE ENLIVE PO LIQD: 237.0000 mL | Freq: Three times a day (TID) | ORAL | 12 refills | Status: DC

## 2020-02-03 MED ORDER — APIXABAN 5 MG PO TABS: 5.0000 mg | ORAL_TABLET | Freq: Two times a day (BID) | ORAL | 5 refills | Status: DC

## 2020-02-03 NOTE — Progress Notes (Signed)
Occupational Therapy Treatment Patient Details Name: Anthony Morrison MRN: [id number] DOB: 01-Aug-1941 Today's Date: 02/03/2020    History of present illness Anthony Morrison is a 79 y.o. male with medical history significant for emphysema, coronary artery disease status post CABG, and chronic kidney disease stage II-III who was diagnosed with COVID-19 on 01/10/2020 (visible in [place]), was started on systemic steroid and antibiotic in the outpatient setting, but continued to worsen and presented to the emergency department on 01/22/2020. He developed general malaise and cough ~1/15 and went on to develop some dyspnea and sputum production, but has not had any chest pain, abdominal pain, or leg swelling or tenderness.   OT comments  Patient on [facility] on arrival with nursing.  He was able to stand and complete toileting and hygiene with min guard, though had to stop half way through to sit and take a rest break.  Patient was on 3L O2 and while standinf SpO2 dropped to 78.  With a rest break and O2 bumped to 5L patient recovered to 87.  Once transferred to chair and resting SpO2 back to 89-90.  Actiity tolerance is still poor but improving, and patient initiated pursed lip breathing independently and throughout task.  Reviewed an completed theraputty hand exercies to increase strength and coordination with L hand.  Patient stated it is feeling "just a little bit better than the other day."  Will continue to follow with OT acutely to address the deficits listed below.  Patient expecting discharge to SNF soon.   Follow Up Recommendations  SNF    Equipment Recommendations  None recommended by OT    Recommendations for Other Services Other (comment)    Precautions / Restrictions Precautions Precautions: Fall;Other (comment) Precaution Comments: monitor 02 sats desats with activity Restrictions Weight Bearing Restrictions: No       Mobility Bed Mobility               General bed  mobility comments: Up in chair  Transfers Overall transfer level: Needs assistance Equipment used: 1 person hand held assist Transfers: Sit to/from Stand;Stand Pivot Transfers Sit to Stand: Min guard Stand pivot transfers: Min guard            Balance Overall balance assessment: Needs assistance Sitting-balance support: Feet supported Sitting balance-Leahy Scale: Good     Standing balance support: Bilateral upper extremity supported Standing balance-Leahy Scale: Poor                            ADL either performed or assessed with clinical judgement   ADL Overall ADL's : Needs assistance/impaired Eating/Feeding: Independent;Sitting                       Toilet Transfer: Min guard;Stand-pivot;BSC   Toileting- Clothing Manipulation and Hygiene: Min guard;Sit to/from stand       Functional mobility during ADLs: Min guard General ADL Comments: Pt able to transfer to bedside chair with min hand held assist. Noted 0 instances of LOB, however pt unsteady on feet.      Vision       Perception     Praxis      Cognition Arousal/Alertness: Awake/alert Behavior During Therapy: WFL for tasks assessed/performed Overall Cognitive Status: Within Functional Limits for tasks assessed  Exercises Exercises: Hand exercises Hand Exercises Digit Composite Flexion: Strengthening;10 reps;Other (comment)(Theraputty ) Composite Extension: 10 reps;Strengthening(Theraputty) Other Exercises Other Exercises: x10 fultter valve Other Exercises: x10 incentive spriometer   Shoulder Instructions   General Comments     Pertinent Vitals/ Pain       Pain Assessment: No/denies pain Faces Pain Scale: No hurt Pain Location: Mouth sores Pain Descriptors / Indicators: Discomfort;Sore;Tender Pain Intervention(s): Monitored during session  Home Living                                           Prior Functioning/Environment              Frequency  Min 3X/week        Progress Toward Goals  OT Goals(current goals can now be found in the care plan section)  Progress towards OT goals: Progressing toward goals  Acute Rehab OT Goals Patient Stated Goal: Wants to return to a safe environment, open to therapy due to spouse being sick at home. OT Goal Formulation: With patient Time For Goal Achievement: 02/07/20 Potential to Achieve Goals: Good  Plan Discharge plan needs to be updated    Co-evaluation                 AM-PAC OT "6 Clicks" Daily Activity     Outcome Measure   Help from another person eating meals?: None Help from another person taking care of personal grooming?: A Little Help from another person toileting, which includes using toliet, bedpan, or urinal?: A Little Help from another person bathing (including washing, rinsing, drying)?: A Little Help from another person to put on and taking off regular upper body clothing?: A Little Help from another person to put on and taking off regular lower body clothing?: A Little 6 Click Score: 19    End of Session Equipment Utilized During Treatment: Oxygen  OT Visit Diagnosis: Unsteadiness on feet (R26.81);Muscle weakness (generalized) (M62.81);Other symptoms and signs involving the nervous system (R29.898)   Activity Tolerance Patient limited by fatigue   Patient Left in chair;with call bell/phone within reach   Nurse Communication Mobility status        Time: 9179-1505 OT Time Calculation (min): 40 min  Charges: OT General Charges $OT Visit: 1 Visit OT Treatments $Self Care/Home Management : 8-22 mins $Therapeutic Activity: 23-37 mins  [name], OTR/L    [name] 02/03/2020, 12:19 PM

## 2020-02-03 NOTE — Care Management Important Message (Signed)
Important Message  Patient Details  Name: Anthony Morrison MRN: [id number] Date of Birth: 03/30/1941   Medicare Important Message Given:  Yes - Important Message mailed due to current National Emergency  Verbal consent obtained due to current National Emergency  Relationship to patient: Child Contact Name: [name] Call Date: 02/03/20  Time: 1214 Phone: [phone] Outcome: Spoke with contact Important Message mailed to: Patient address on file    Delorse Lek 02/03/2020, 12:15 PM

## 2020-02-03 NOTE — TOC Transition Note (Signed)
Transition of Care [facility]) - CM/SW Discharge Note   Patient Details  Name: PATRIK TURNBAUGH MRN: [id number] Date of Birth: November 26, 1941  Transition of Care [facility]) CM/SW Contact:  Ninfa Meeker, RN Phone Number: [phone] (working remotely) 02/03/2020, 2:56 PM   Clinical Narrative:   Patient will DC to: [place] date: 02/03/20 Family Notified: Son- [name] Transport by: Corey Harold: 3:30pm  Patient is medically ready to discharge to [facility]. Charge Nurse, Bedside RN, family and facility are aware of discharge plan. Discharge Summary and FL2 will be sent to [facility] via the Hub. Bedside RN to call report to [phone], patient will go to room 403. Ambulance transport has been arranged.     Final next level of care: [place] Barriers to Discharge: No Barriers Identified   Patient Goals and CMS Choice Patient states their goals for this hospitalization and ongoing recovery are:: toi goi home CMS Medicare.gov Compare Post Acute Care list provided to:: Patient Choice offered to / list presented to : Patient, Adult Children  Discharge Placement                       Discharge Plan and Services   Discharge Planning Services: CM Consult Post Acute Care Choice: [place]                               Social Determinants of Health (SDOH) Interventions     Readmission Risk Interventions No flowsheet data found.

## 2020-02-03 NOTE — Discharge Summary (Addendum)
Discharge Summary  Anthony Morrison VOJ:[id number] DOB: [birth year]  PCP: Rusty Aus, MD  Admit date: 01/24/2020 Discharge date: 02/03/2020  Time spent: 25 minutes  Recommendations for Outpatient Follow-up:  1. Patient is being discharged to [facility] short-term skilled nursing facility 2. Patient will follow-up with his PCP in 1 month 3. At that time, PCP can coordinate depending on patient's ulnar symptoms, an MRI of his left elbow and if need be, further work-up including EMG study. (See below) 4. New medication: Eliquis 5 mg p.o. twice daily.  Patient will need to continue this medication for 6 months for DVT. 5. Medication clarification: Patient is supposed to be on metoprolol 50 mg p.o. twice daily. (He had listed 25 mg twice daily which have been increased prior to hospitalization) 6. Medication change: Lisinopril decreased to 10 mg daily.  This medication may be increased in the future pending renal function stability. 7. Patient is being discharged on 2 L nasal cannula oxygen, 3 liters with activity.  As he continues to improve, this medication may be able to be weaned off. 8. Patient is not felt to be contagious from a Covid standpoint.  He is past the 21-day mark from his positive Covid test on 1/19. 9. Patient is able to get the vaccine after 3/5, 45 days after his positive Covid test  Discharge Diagnoses:  Active Hospital Problems   Diagnosis Date Noted  . Acute hypoxemic respiratory failure due to COVID-19 ([place]) 01/23/2020  . Ulnar neuropathy of left upper extremity 02/01/2020  . Severe protein-calorie malnutrition ([place]) 01/31/2020  . Upper GI bleed 01/28/2020  . Acute deep vein thrombosis (DVT) of lower extremity ([place]) 01/26/2020  . Pneumonia due to COVID-19 virus 01/25/2020  . COPD (chronic obstructive pulmonary disease) ([place])   . CAP (community acquired pneumonia) 01/22/2020  . Acute renal failure superimposed on stage 3 chronic kidney disease ([place]) 01/22/2020    . Elevated troponin 01/22/2020  . Sepsis ([place]) 01/22/2020  . ASCVD (arteriosclerotic cardiovascular disease) 01/25/2019  . S/P CABG x 3 01/14/2019  . Benign essential HTN 01/10/2019  . Hyperlipidemia, mixed 03/25/2018  . 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    Discharge Condition: Improved, being discharged to short-term skilled nursing  Diet recommendation: Heart healthy with Ensure Enlive 3 times daily between meals  Vitals:   02/03/20 0806 02/03/20 0822  BP:  114/60  Pulse: 61   Resp: 17   Temp:  97.8 F (36.6 C)  SpO2: 95%     History of present illness:  79 year old male with past medical history of stage II chronic kidney disease, CAD status post CABG and COPD diagnosed with Covid on 1/19 and presented to the emergency room on 1/31 with progressively worsening shortness of breath and found to be hypoxic as well as have sepsis from Covid/community-acquired pneumonia, so admitted to the hospitalist service.   Hospital Course:  Principal Problem:   Acute hypoxemic respiratory failure due to COVID-19 [facility]) with history of underlying COPD/emphysema: Patient treated with IV Actemra, Remdisivir and steroids and since then, oxygenation has improved although he still needs 2 to 5 L depending on exertion to keep oxygen levels above 90%.  By day of discharge, patient breathing more comfortably.  Not wheezing.  CRP less than 0.5, so will discontinue steroids altogether.  Completed full 5-day course of remdesivir.  He still requiring 2 L nasal cannula at rest and with exertion, 3 L.  In discussion  with  Active Problems: CAD S/P CABG x 3: Stable.  Elevated troponins felt to be secondary to respiratory dysfunction    Benign essential HTN: Blood pressure stable.  Although, because of his renal function impairment initially, his lisinopril was decreased from 20 mg down to 10 mg.    Hyperlipidemia, mixed: Continue  statin    Acute renal failure superimposed on stage 2 chronic kidney disease ([place]): Secondary to initial sepsis.  Renal function close to baseline.    Sepsis ([place]) secondary to Covid pneumonia/CAP: Patient met criteria for sepsis on admission given leukocytosis, acute kidney injury and lactic acidosis along with tachycardia and pulmonary source.  Elevated procalcitonin level of 2.21 as well.  Treated with IV Rocephin and Zithromax of which she has completed the full course of.  Lactic acidosis resolved with aggressive fluid resuscitation and sepsis felt to be stabilized.    Acute deep vein thrombosis (DVT) of lower extremity [facility]): Patient's hospital course was complicated by bilateral DVT, diagnosed on 2/4.  Initially started on heparin and then transition to Eliquis.  He appears to be tolerating Eliquis well.  Bloody BM: No evidence of acute bleeding.    Reportedly, this was observed on 2/6.  Patient has had no change in his hemoglobin during hospitalization.    Severe protein-calorie malnutrition [facility]): Patient meets criteria as evidenced by energy intake less than or equal to 50% for more than 5 days as well as 10% weight loss within a week.  Seen by nutrition and put on Hormel shake daily with breakfast plus Magic cup twice daily and Ensure Enlive 3 times daily.  BMI of 23.    Ulnar neuropathy of left upper extremity: Patient also reports difficulty with left hand grip strength and numbness in his fourth and fifth fingers.  A CT scan of head was unremarkable on 2/9, noting no evidence of CVA.  Patient states that this started when he was first admitted on 1/31 and has remained persistent.    Symptoms consistent with an ulnar nerve neuropathy/impairment.  Case discussed with neurology.  With CVA ruled out, peripheral nerve work-up could be started.  Patient will need outpatient MRI.  He feels like with physical therapy already, his symptoms seem to be somewhat improved.  Will recommend that he  follow-up with his PCP in 1 month's time and if he is still having symptoms, PCP can arrange for MRI of left elbow.  If further work-up needed, can look at EMG studies.     Consultants:  None  Procedures:  Lower extremity Doppler done 2/4 + for bilateral DVT  Discharge Exam: BP 114/60 (BP Location: Left Arm)   Pulse 61   Temp 97.8 F (36.6 C) (Oral)   Resp 17   Ht 5' 6"  (1.676 m)   Wt 63.4 kg   SpO2 95%   BMI 22.55 kg/m   General: Alert and oriented x3, no acute distress Cardiovascular: Regular rate and rhythm, S1-S2 Respiratory: Clear to auscultation bilaterally  Discharge Instructions You were cared for by a hospitalist during your hospital stay. If you have any questions about your discharge medications or the care you received while you were in the hospital after you are discharged, you can call the unit and asked to speak with the hospitalist on call if the hospitalist that took care of you is not available. Once you are discharged, your primary care physician will handle any further medical issues. Please note that NO REFILLS for any discharge medications will be authorized once  you are discharged, as it is imperative that you return to your primary care physician (or establish a relationship with a primary care physician if you do not have one) for your aftercare needs so that they can reassess your need for medications and monitor your lab values.  Discharge Instructions    Diet - low sodium heart healthy   Complete by: As directed    Increase activity slowly   Complete by: As directed      Allergies as of 02/03/2020      Reactions   Prednisone Itching   Sulfa Antibiotics Other (See Comments)   Unknown      Medication List    STOP taking these medications   azithromycin 500 mg in sodium chloride 0.9 % 250 mL   cefTRIAXone 1 g in sodium chloride 0.9 % 100 mL   dexamethasone 10 MG/ML injection Commonly known as: DECADRON   guaiFENesin 600 MG 12 hr  tablet Commonly known as: MUCINEX     TAKE these medications   acetaminophen 325 MG tablet Commonly known as: TYLENOL Take 2 tablets (650 mg total) by mouth every 6 (six) hours as needed for mild pain or headache (fever >/= 101).   Advair HFA 230-21 MCG/ACT inhaler Generic drug: fluticasone-salmeterol Inhale 2 puffs into the lungs every 12 (twelve) hours.   apixaban 5 MG Tabs tablet Commonly known as: ELIQUIS Take 1 tablet (5 mg total) by mouth 2 (two) times daily.   feeding supplement (ENSURE ENLIVE) Liqd Take 237 mLs by mouth 3 (three) times daily between meals.   guaiFENesin-dextromethorphan 100-10 MG/5ML syrup Commonly known as: ROBITUSSIN DM Take 10 mLs by mouth every 4 (four) hours as needed for cough.   ipratropium 17 MCG/ACT inhaler Commonly known as: ATROVENT HFA Inhale 2 puffs into the lungs every 6 (six) hours.   levalbuterol 45 MCG/ACT inhaler Commonly known as: XOPENEX HFA Inhale 2 puffs into the lungs every 6 (six) hours.   lisinopril 10 MG tablet Commonly known as: ZESTRIL Take 1 tablet (10 mg total) by mouth daily. Start taking on: February 04, 2020 What changed:   medication strength  how much to take   metoprolol tartrate 50 MG tablet Commonly known as: LOPRESSOR Take 50 mg by mouth 2 (two) times daily.   rosuvastatin 10 MG tablet Commonly known as: CRESTOR Take 10 mg by mouth at bedtime.            Durable Medical Equipment  (From admission, onward)         Start     Ordered   02/03/20 1103  DME Oxygen  Once    Question Answer Comment  Length of Need 6 Months   Mode or (Route) Nasal cannula   Frequency Continuous (stationary and portable oxygen unit needed)   Oxygen conserving device Yes   Oxygen delivery system Gas      02/03/20 1103   01/31/20 1555  For home use only DME oxygen  Once    Comments: SATURATION QUALIFICATIONS: (This note is used to comply with regulatory documentation for home oxygen) Patient Saturations on Room  Air at Rest = 92 % Patient Saturations on Room Air while Ambulating = 86% Patient Saturations on 4 Liters of oxygen while Ambulating = 94 % Please briefly explain why patient needs home oxygen: Patient briefly desaturates while ambulating on room air. With 4L of nasal canula patient quickly improves to 94% -Patient meets criteria for home O2 -4 L O2 via [place] titrate to maintain SPO2>  88% -Provide Inogen portable home O2 concentrator  Question Answer Comment  Length of Need 12 Months   Mode or (Route) Nasal cannula   Liters per Minute 4   Frequency Continuous (stationary and portable oxygen unit needed)   Oxygen conserving device Yes   Oxygen delivery system Gas      01/31/20 1554         Allergies  Allergen Reactions  . Prednisone Itching  . Sulfa Antibiotics Other (See Comments)    Unknown   Follow-up Information    Rusty Aus, MD Follow up in 1 month(s).   Specialty: Internal Medicine Contact information: [address] [place] [address] [phone]            The results of significant diagnostics from this hospitalization (including imaging, microbiology, ancillary and laboratory) are listed below for reference.    Significant Diagnostic Studies: CT HEAD WO CONTRAST  Result Date: 01/31/2020 CLINICAL DATA:  Acute neurologic deficit EXAM: CT HEAD WITHOUT CONTRAST TECHNIQUE: Contiguous axial images were obtained from the base of the skull through the vertex without intravenous contrast. COMPARISON:  None. FINDINGS: Brain: Hypodensities are seen within the bilateral basal ganglia and periventricular white matter, most pronounced within the bilateral frontal and right parietal regions. This is compatible with age indeterminate small vessel ischemic changes, favor chronic. No other signs of acute infarct or hemorrhage. The lateral ventricles and remaining midline structures are unremarkable. No acute extra-axial fluid  collections. No mass effect. Vascular: No hyperdense vessel or unexpected calcification. Skull: Normal. Negative for fracture or focal lesion. Sinuses/Orbits: No acute finding. Other: None IMPRESSION: 1. Likely chronic small vessel ischemic changes within the periventricular white matter and bilateral basal ganglia. 2. Otherwise no acute intracranial process. Electronically Signed   By: Randa Ngo M.D.   On: 01/31/2020 16:39   CT ANGIO CHEST PE W OR WO CONTRAST  Result Date: 01/26/2020 CLINICAL DATA:  High probability for pulmonary embolism. Elevated D-dimer. EXAM: CT ANGIOGRAPHY CHEST WITH CONTRAST TECHNIQUE: Multidetector CT imaging of the chest was performed using the standard protocol during bolus administration of intravenous contrast. Multiplanar CT image reconstructions and MIPs were obtained to evaluate the vascular anatomy. CONTRAST:  47m OMNIPAQUE IOHEXOL 350 MG/ML SOLN COMPARISON:  Chest radiographs, most recent 01/23/2020. No prior CT. FINDINGS: Cardiovascular: The quality of this exam for evaluation of pulmonary embolism is good. No pulmonary embolism. Advanced aortic and branch vessel atherosclerosis. No aortic dissection. Tortuous thoracic aorta. Mild cardiomegaly. Median sternotomy for prior CABG. No pericardial effusion. Mediastinum/Nodes: Prominent nodal tissue within the azygoesophageal recess is likely reactive and not pathologic by size criteria. No hilar adenopathy. Lungs/Pleura: Small bilateral pleural effusions. 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 Dependent right greater than left lower lobe and less so upper lobe airspace and ground-glass opacities. Areas of nodularity, including within the left lower lobe at 9 mm on 121/7 and adjacent to a presumed infected bulla at 7 mm in the left lower lobe on 128/7. Upper Abdomen: Focal steatosis adjacent the falciform ligament. Normal imaged portions of the spleen, stomach, pancreas, adrenal glands, left kidney. Right renal low-density  lesions of maximally 1.3 cm are fluid density, consistent with cysts. Musculoskeletal: No acute osseous abnormality. Review of the MIP images confirms the above findings. IMPRESSION: 1.  No evidence of pulmonary embolism. 2. Multifocal airspace and ground-glass opacity, primarily within the dependent lower lobes. Most consistent with infection or aspiration. 3. Areas of nodularity, most significant at 9 mm in the  left lower lobe. Cannot exclude concurrent primary bronchogenic carcinoma. Recommend appropriate antibiotic therapy and CT follow-up at 2-3 months. 4. Small bilateral pleural effusions. 5. Aortic atherosclerosis (ICD10-I70.0) and emphysema (ICD10-J43.9). Electronically Signed   By: Abigail Miyamoto M.D.   On: 01/26/2020 12:04   US Venous Img Lower Bilateral (DVT)  Result Date: 01/22/2020 CLINICAL DATA:  79 year old male COVID-19. Fever and respiratory failure. Abnormal D-dimer. History of right leg greater saphenous vein harvest for CABG. EXAM: BILATERAL LOWER EXTREMITY VENOUS DOPPLER ULTRASOUND TECHNIQUE: Gray-scale sonography with graded compression, as well as color Doppler and duplex ultrasound were performed to evaluate the lower extremity deep venous systems from the level of the common femoral vein and including the common femoral, femoral, profunda femoral, popliteal and calf veins including the posterior tibial, peroneal and gastrocnemius veins when visible. The superficial great saphenous vein was also interrogated. Spectral Doppler was utilized to evaluate flow at rest and with distal augmentation maneuvers in the common femoral, femoral and popliteal veins. COMPARISON:  CT Abdomen and Pelvis 02/26/2011. FINDINGS: RIGHT LOWER EXTREMITY Common Femoral Vein: No evidence of thrombus. Normal compressibility, respiratory phasicity and response to augmentation. Saphenofemoral Junction: No evidence of thrombus. Normal compressibility and flow on color Doppler imaging. Profunda Femoral Vein: No evidence  of thrombus. Normal compressibility and flow on color Doppler imaging. Femoral Vein: No evidence of thrombus. Normal compressibility, respiratory phasicity and response to augmentation. Popliteal Vein: No evidence of thrombus. Normal compressibility, respiratory phasicity and response to augmentation. Calf Veins: No evidence of thrombus. Normal compressibility and flow on color Doppler imaging. Other Findings:  None. LEFT LOWER EXTREMITY Common Femoral Vein: No evidence of thrombus. Normal compressibility, respiratory phasicity and response to augmentation. Saphenofemoral Junction: No evidence of thrombus. Normal compressibility and flow on color Doppler imaging. Profunda Femoral Vein: No evidence of thrombus. Normal compressibility and flow on color Doppler imaging. Femoral Vein: No evidence of thrombus. Normal compressibility, respiratory phasicity and response to augmentation. Popliteal Vein: No evidence of thrombus. Normal compressibility, respiratory phasicity and response to augmentation. Calf Veins: No evidence of thrombus. Normal compressibility and flow on color Doppler imaging. Visible Superficial Great Saphenous Vein: No evidence of thrombus. Normal compressibility. Other Findings:  None. IMPRESSION: No evidence of deep venous thrombosis in either lower extremity. Electronically Signed   By: Genevie Ann M.D.   On: 01/22/2020 20:21   DG Chest Port 1 View  Result Date: 01/23/2020 CLINICAL DATA:  COVID positive patient with shortness of breath. EXAM: PORTABLE CHEST 1 VIEW COMPARISON:  Single-view of the chest 01/22/2020. PA and lateral chest 02/14/2019. FINDINGS: Right worse than left airspace disease is again seen and has somewhat worsened, particularly in the left mid and lower lung zones. The lungs are emphysematous. Heart size is normal. The patient is status post CABG. No pneumothorax or pleural effusion. IMPRESSION: Persistent multifocal pneumonia is worse on the right and shows progression on the left.  Emphysema. Electronically Signed   By: Inge Rise M.D.   On: 01/23/2020 11:08   DG Chest Port 1 View  Result Date: 01/22/2020 CLINICAL DATA:  Fever. COVID-19 patient. EXAM: PORTABLE CHEST 1 VIEW COMPARISON:  February 14, 2019 FINDINGS: Infiltrates are seen in the lung bases, right greater than left. No pneumothorax. The heart size borderline. The hila and mediastinum are normal. No other abnormalities. IMPRESSION: Bibasilar infiltrates, right greater than left, consistent with COVID-19 pneumonia given history. Electronically Signed   By: Dorise Bullion III M.D   On: 01/22/2020 16:29   VAS [place] LOWER EXTREMITY  VENOUS (DVT)  Result Date: 01/26/2020  Lower Venous DVTStudy Indications: D-dimer >20, not on anticoagulation, COVID-19.  Comparison Study: 01/22/2020 negative bilateral lower extremity venous duplex. Performing Technologist: Maudry Mayhew MHA, RDMS, RVT, RDCS  Examination Guidelines: A complete evaluation includes B-mode imaging, spectral Doppler, color Doppler, and power Doppler as needed of all accessible portions of each vessel. Bilateral testing is considered an integral part of a complete examination. Limited examinations for reoccurring indications may be performed as noted. The reflux portion of the exam is performed with the patient in reverse Trendelenburg.  +---------+---------------+---------+-----------+----------+--------------+ RIGHT    CompressibilityPhasicitySpontaneityPropertiesThrombus Aging +---------+---------------+---------+-----------+----------+--------------+ CFV      Full           Yes      Yes                                 +---------+---------------+---------+-----------+----------+--------------+ SFJ      Full                                                        +---------+---------------+---------+-----------+----------+--------------+ FV Prox  Full                                                         +---------+---------------+---------+-----------+----------+--------------+ FV Mid   Full                                                        +---------+---------------+---------+-----------+----------+--------------+ FV DistalFull                                                        +---------+---------------+---------+-----------+----------+--------------+ PFV      Full                                                        +---------+---------------+---------+-----------+----------+--------------+ POP      Full           Yes      Yes                                 +---------+---------------+---------+-----------+----------+--------------+ PTV      None                    No                   Acute          +---------+---------------+---------+-----------+----------+--------------+   Right Technical Findings: Not visualized segments include Right peroneal veins.  +---------+---------------+---------+-----------+----------+--------------+ LEFT     CompressibilityPhasicitySpontaneityPropertiesThrombus  Aging +---------+---------------+---------+-----------+----------+--------------+ CFV      Full           Yes      Yes                                 +---------+---------------+---------+-----------+----------+--------------+ SFJ      Full                                                        +---------+---------------+---------+-----------+----------+--------------+ FV Prox  Full                                                        +---------+---------------+---------+-----------+----------+--------------+ FV Mid   Full                                                        +---------+---------------+---------+-----------+----------+--------------+ FV DistalFull                                                        +---------+---------------+---------+-----------+----------+--------------+ PFV      Full                                                         +---------+---------------+---------+-----------+----------+--------------+ POP      Full           Yes      Yes                                 +---------+---------------+---------+-----------+----------+--------------+ PTV                              No                   Acute          +---------+---------------+---------+-----------+----------+--------------+ PERO                             No                   Acute          +---------+---------------+---------+-----------+----------+--------------+ Inadequate visualization in transverse plane to perform compression maneuvers.    Summary: RIGHT: - Findings consistent with acute deep vein thrombosis involving the right posterior tibial veins. - No cystic structure found in the popliteal fossa.  LEFT: - Findings consistent with acute deep vein thrombosis involving the left posterior tibial veins, and left peroneal veins. - No  cystic structure found in the popliteal fossa.  *See table(s) above for measurements and observations. Electronically signed by Servando Snare MD on 01/26/2020 at 2:47:49 PM.    Final     Microbiology: No results found for this or any previous visit (from the past 240 hour(s)).   Labs: Basic Metabolic Panel: Recent Labs  Lab 01/30/20 0600 01/31/20 0440 02/01/20 0550 02/02/20 0510 02/03/20 0025  NA 138 137 141 137 136  K 4.2 4.2 4.5 4.5 4.4  CL 109 108 111 108 106  CO2 20* 21* 23 19* 22  GLUCOSE 96 94 90 82 84  BUN 40* 39* 39* 42* 37*  CREATININE 1.14 1.00 1.19 1.15 1.15  CALCIUM 8.1* 8.1* 8.2* 8.1* 8.3*  MG 2.3 2.4 2.3 2.3 2.4  PHOS 3.6 2.9 3.3 3.5 2.9   Liver Function Tests: Recent Labs  Lab 01/30/20 0600 01/31/20 0440 02/01/20 0550 02/02/20 0510 02/03/20 0025  AST 28 24 21 24 23   ALT 47* 40 35 34 31  ALKPHOS 73 75 60 67 68  BILITOT 0.8 0.7 1.0 1.1 1.1  PROT 5.6* 5.7* 5.5* 5.6* 5.9*  ALBUMIN 3.1* 3.2* 3.1* 3.1* 3.4*   No results for input(s): LIPASE,  AMYLASE in the last 168 hours. No results for input(s): AMMONIA in the last 168 hours. CBC: Recent Labs  Lab 01/30/20 0600 01/31/20 0440 02/01/20 0550 02/02/20 0510 02/03/20 0025  WBC 15.0* 12.9* 11.0* 10.0 8.5  NEUTROABS 12.6* 10.8* 8.9* 7.9* 6.7  HGB 14.6  14.8 14.2 14.2 15.2 15.4  HCT 43.3  43.6 43.6 42.6 45.3 46.7  MCV 92.7 94.0 92.4 93.8 93.8  PLT 178 219 185 165 168   Cardiac Enzymes: No results for input(s): CKTOTAL, CKMB, CKMBINDEX, TROPONINI in the last 168 hours. BNP: BNP (last 3 results) No results for input(s): BNP in the last 8760 hours.  ProBNP (last 3 results) No results for input(s): PROBNP in the last 8760 hours.  CBG: Recent Labs  Lab 02/02/20 1422  GLUCAP 100*       Signed:  Annita Brod, MD [facility] 02/03/2020, 11:07 AM

## 2020-02-03 NOTE — Progress Notes (Signed)
Patient just left via ems headed to [facility]. All belongings bagged and brought on stretcher.  Patient in good spirits accompanied by two ems techs.

## 2020-02-03 NOTE — TOC Progression Note (Signed)
Transition of Care [facility]) - Progression Note    Patient Details  Name: Anthony Morrison MRN: [id number] Date of Birth: 1941/11/13  Transition of Care [facility]) CM/SW Contact  Leeroy Cha, RN Phone Number: 02/03/2020, 7:47 AM  Clinical Narrative:    Per conversation with son last pm/has agreed to go to [facility]/fl2 sent to snf at [phone].   Expected Discharge Plan: Skilled Nursing Facility Barriers to Discharge: SNF Pending bed offer  Expected Discharge Plan and Services Expected Discharge Plan: [place]   Discharge Planning Services: CM Consult Post Acute Care Choice: [place] Living arrangements for the past 2 months: Single Family Home                                       Social Determinants of Health (SDOH) Interventions    Readmission Risk Interventions No flowsheet data found.

## 2020-02-03 NOTE — Progress Notes (Signed)
Physical Therapy Treatment Patient Details Name: Anthony Morrison MRN: [id number] DOB: 18-Sep-1941 Today's Date: 02/03/2020    History of Present Illness Anthony Morrison is a 79 y.o. male with medical history significant for emphysema, coronary artery disease status post CABG, and chronic kidney disease stage II-III who was diagnosed with COVID-19 on 01/10/2020 (visible in [place]), was started on systemic steroid and antibiotic in the outpatient setting, but continued to worsen and presented to the emergency department on 01/22/2020. He developed general malaise and cough ~1/15 and went on to develop some dyspnea and sputum production, but has not had any chest pain, abdominal pain, or leg swelling or tenderness.    PT Comments    Pt demonstrated much improved tolerance to activity and transfers today. He continues to require 5L O2 [place] for activity and his O2 sats at rest are 88%. Upon ambulation he desat to 82% but was able to rebound up to 90% with VCs for PLB and diaphragmatic breathing. Pt declined for further mobility as he will be leaving to go to rehab later today. Pt can continue to benefit from skilled PT to address unsteadiness on feet, reduced activity tolerance, generalized weakness, safety with DME, and gait deficits. Pt verbalized understanding to prior HEP and given green and orange theraband for home use.   Follow Up Recommendations  SNF     Equipment Recommendations  None recommended by PT    Recommendations for Other Services Rehab consult     Precautions / Restrictions Precautions Precautions: Fall;Other (comment) Precaution Comments: monitor 02 sats desats with activity Restrictions Weight Bearing Restrictions: No    Mobility  Bed Mobility               General bed mobility comments: Up in chair  Transfers Overall transfer level: Needs assistance Equipment used: 1 person hand held assist Transfers: Sit to/from Stand;Stand Pivot Transfers Sit to  Stand: Min guard Stand pivot transfers: Min guard          Ambulation/Gait Ambulation/Gait assistance: Modified independent (Device/Increase time) Gait Distance (Feet): 25 Feet Assistive device: Rolling walker (2 wheeled) Gait Pattern/deviations: Shuffle;Decreased stride length;Step-to pattern;Trunk flexed;Wide base of support Gait velocity: Decreased, unsteady   General Gait Details: Pt demonstrated improved tolerance on reduced O2 2 L, but was very unsteady standing, refused to walk further due to activity tolerance limitations and food arriving.    Stairs             Wheelchair Mobility    Modified Rankin (Stroke Patients Only)       Balance Overall balance assessment: Needs assistance Sitting-balance support: Feet supported Sitting balance-Leahy Scale: Good     Standing balance support: Bilateral upper extremity supported Standing balance-Leahy Scale: Poor Standing balance comment: Pt able to maintain upright balance with RW support and was steady upon room ambulation.                             Cognition Arousal/Alertness: Awake/alert Behavior During Therapy: WFL for tasks assessed/performed Overall Cognitive Status: Within Functional Limits for tasks assessed                                        Exercises Hand Exercises Digit Composite Flexion: Strengthening;10 reps;Other (comment)(Theraputty ) Composite Extension: 10 reps;Strengthening(Theraputty) Other Exercises Other Exercises: x10 fultter valve Other Exercises: x10 incentive spriometer  General Comments General comments (skin integrity, edema, etc.): When patient stood his pad was soiled with blood from hemorrhoids. Pt states he's doing okay and in no pain, but it's exacerbated due to herparin currently.      Pertinent Vitals/Pain Pain Assessment: No/denies pain Faces Pain Scale: No hurt Pain Location: Mouth sores Pain Descriptors / Indicators:  Discomfort;Sore;Tender Pain Intervention(s): Monitored during session    Home Living                      Prior Function            PT Goals (current goals can now be found in the care plan section) Acute Rehab PT Goals Patient Stated Goal: Wants to return to a safe environment, open to therapy due to spouse being sick at home. PT Goal Formulation: With patient Time For Goal Achievement: 02/07/20 Potential to Achieve Goals: Fair Progress towards PT goals: Progressing toward goals    Frequency    Min 3X/week      PT Plan Current plan remains appropriate    Co-evaluation              AM-PAC PT "6 Clicks" Mobility   Outcome Measure  Help needed turning from your back to your side while in a flat bed without using bedrails?: A Little Help needed moving from lying on your back to sitting on the side of a flat bed without using bedrails?: A Little Help needed moving to and from a bed to a chair (including a wheelchair)?: A Little Help needed standing up from a chair using your arms (e.g., wheelchair or bedside chair)?: A Little Help needed to walk in hospital room?: A Little Help needed climbing 3-5 steps with a railing? : Total 6 Click Score: 16    End of Session Equipment Utilized During Treatment: Oxygen Activity Tolerance: Patient limited by fatigue;Patient limited by lethargy;Treatment limited secondary to medical complications (Comment) Patient left: in chair;with call bell/phone within reach Nurse Communication: Mobility status PT Visit Diagnosis: Other abnormalities of gait and mobility (R26.89);Muscle weakness (generalized) (M62.81)     Time: 1025-8527 PT Time Calculation (min) (ACUTE ONLY): 12 min  Charges:  $Therapeutic Activity: 8-22 mins                     Ann Held PT, DPT Acute Rehab [place] P: [place] 02/03/2020, 11:22 AM

## 2020-02-03 NOTE — Progress Notes (Signed)
This RN notified [name]. Patients son of patients departure and informed him of patients new room number at [facility].

## 2020-02-06 DIAGNOSIS — U071 COVID-19: Secondary | ICD-10-CM | POA: Diagnosis not present

## 2020-02-06 DIAGNOSIS — J1282 Pneumonia due to coronavirus disease 2019: Secondary | ICD-10-CM | POA: Diagnosis not present

## 2020-02-06 DIAGNOSIS — J431 Panlobular emphysema: Secondary | ICD-10-CM | POA: Diagnosis not present

## 2020-02-06 DIAGNOSIS — B370 Candidal stomatitis: Secondary | ICD-10-CM | POA: Diagnosis not present

## 2020-02-09 DIAGNOSIS — I10 Essential (primary) hypertension: Secondary | ICD-10-CM | POA: Diagnosis not present

## 2020-02-09 DIAGNOSIS — J431 Panlobular emphysema: Secondary | ICD-10-CM | POA: Diagnosis not present

## 2020-02-09 DIAGNOSIS — I471 Supraventricular tachycardia: Secondary | ICD-10-CM | POA: Diagnosis not present

## 2020-02-27 DIAGNOSIS — I82443 Acute embolism and thrombosis of tibial vein, bilateral: Secondary | ICD-10-CM | POA: Insufficient documentation

## 2020-02-27 DIAGNOSIS — U071 COVID-19: Secondary | ICD-10-CM | POA: Diagnosis not present

## 2020-02-27 DIAGNOSIS — J1282 Pneumonia due to coronavirus disease 2019: Secondary | ICD-10-CM | POA: Diagnosis not present

## 2020-02-27 DIAGNOSIS — E440 Moderate protein-calorie malnutrition: Secondary | ICD-10-CM | POA: Diagnosis not present

## 2020-03-17 DIAGNOSIS — J432 Centrilobular emphysema: Secondary | ICD-10-CM | POA: Diagnosis not present

## 2020-03-27 DIAGNOSIS — E782 Mixed hyperlipidemia: Secondary | ICD-10-CM | POA: Diagnosis not present

## 2020-03-27 DIAGNOSIS — E538 Deficiency of other specified B group vitamins: Secondary | ICD-10-CM | POA: Diagnosis not present

## 2020-04-03 DIAGNOSIS — L570 Actinic keratosis: Secondary | ICD-10-CM | POA: Diagnosis not present

## 2020-04-03 DIAGNOSIS — J1282 Pneumonia due to coronavirus disease 2019: Secondary | ICD-10-CM | POA: Diagnosis not present

## 2020-04-03 DIAGNOSIS — Z951 Presence of aortocoronary bypass graft: Secondary | ICD-10-CM | POA: Insufficient documentation

## 2020-04-03 DIAGNOSIS — U071 COVID-19: Secondary | ICD-10-CM | POA: Diagnosis not present

## 2020-04-03 DIAGNOSIS — I739 Peripheral vascular disease, unspecified: Secondary | ICD-10-CM | POA: Diagnosis not present

## 2020-04-03 DIAGNOSIS — Z0000 Encounter for general adult medical examination without abnormal findings: Secondary | ICD-10-CM | POA: Diagnosis not present

## 2020-04-03 DIAGNOSIS — J431 Panlobular emphysema: Secondary | ICD-10-CM | POA: Diagnosis not present

## 2020-04-03 DIAGNOSIS — E538 Deficiency of other specified B group vitamins: Secondary | ICD-10-CM | POA: Diagnosis not present

## 2020-04-03 DIAGNOSIS — I82443 Acute embolism and thrombosis of tibial vein, bilateral: Secondary | ICD-10-CM | POA: Diagnosis not present

## 2020-04-17 DIAGNOSIS — J432 Centrilobular emphysema: Secondary | ICD-10-CM | POA: Diagnosis not present

## 2020-05-17 DIAGNOSIS — J432 Centrilobular emphysema: Secondary | ICD-10-CM | POA: Diagnosis not present

## 2020-07-10 DIAGNOSIS — J431 Panlobular emphysema: Secondary | ICD-10-CM | POA: Diagnosis not present

## 2020-07-10 DIAGNOSIS — I471 Supraventricular tachycardia: Secondary | ICD-10-CM | POA: Diagnosis not present

## 2020-07-10 DIAGNOSIS — I739 Peripheral vascular disease, unspecified: Secondary | ICD-10-CM | POA: Diagnosis not present

## 2020-09-13 ENCOUNTER — Other Ambulatory Visit: Payer: Self-pay

## 2020-09-13 ENCOUNTER — Encounter (INDEPENDENT_AMBULATORY_CARE_PROVIDER_SITE_OTHER): Payer: Self-pay | Admitting: Vascular Surgery

## 2020-09-13 ENCOUNTER — Ambulatory Visit (INDEPENDENT_AMBULATORY_CARE_PROVIDER_SITE_OTHER): Payer: PPO | Admitting: Vascular Surgery

## 2020-09-13 ENCOUNTER — Ambulatory Visit (INDEPENDENT_AMBULATORY_CARE_PROVIDER_SITE_OTHER): Payer: PPO

## 2020-09-13 VITALS — BP 160/70 | HR 61 | Ht 66.0 in | Wt 164.0 lb

## 2020-09-13 DIAGNOSIS — J431 Panlobular emphysema: Secondary | ICD-10-CM

## 2020-09-13 DIAGNOSIS — I739 Peripheral vascular disease, unspecified: Secondary | ICD-10-CM

## 2020-09-13 DIAGNOSIS — I10 Essential (primary) hypertension: Secondary | ICD-10-CM | POA: Diagnosis not present

## 2020-09-13 DIAGNOSIS — I6523 Occlusion and stenosis of bilateral carotid arteries: Secondary | ICD-10-CM

## 2020-09-13 DIAGNOSIS — I70213 Atherosclerosis of native arteries of extremities with intermittent claudication, bilateral legs: Secondary | ICD-10-CM

## 2020-09-13 DIAGNOSIS — I208 Other forms of angina pectoris: Secondary | ICD-10-CM

## 2020-09-13 IMAGING — CT CT HEAD W/O CM
2 series · 15 of 30 positions shown, 19 images · non-contrast
Comparison: None.

CLINICAL DATA: Acute neurologic deficit

EXAM:
CT HEAD WITHOUT CONTRAST
TECHNIQUE: Contiguous axial images were obtained from the base of the skull
through the vertex without intravenous contrast.

[Series 2: routine head w/o · axial · non-contrast · 0.48mm/px · z∈[-22,+122]mm · 13 of 36 slices shown, 17 images]
[im 3/36  brain]
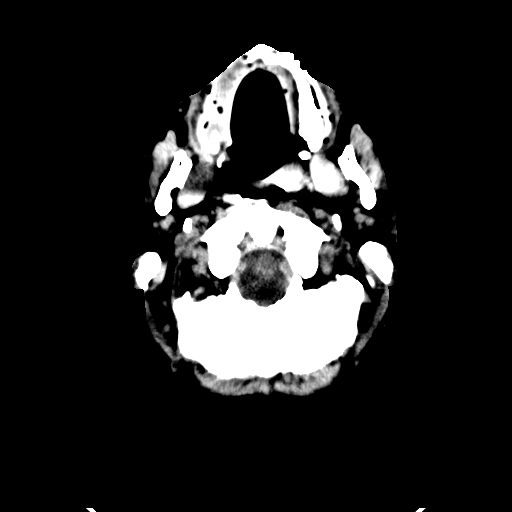
[im 3/36  bone]
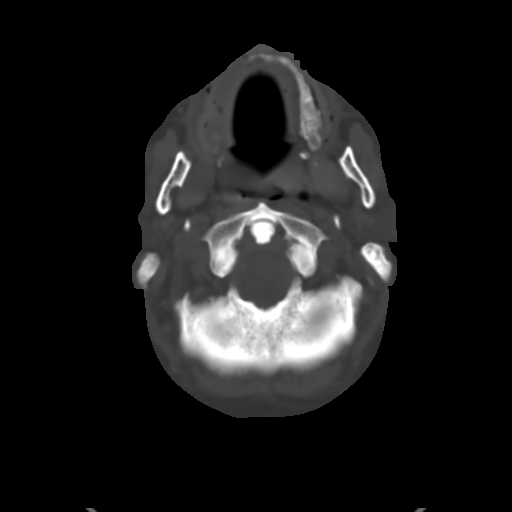
[im 6/36  brain]
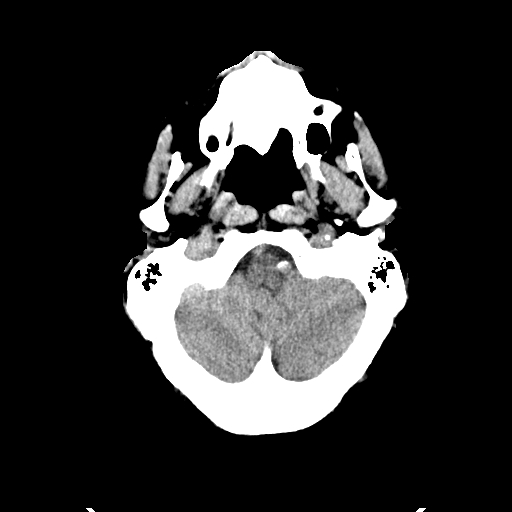
[im 8/36  brain]
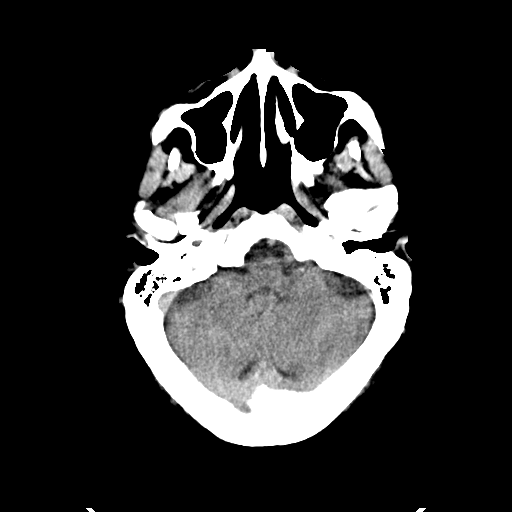
[im 11/36  brain]
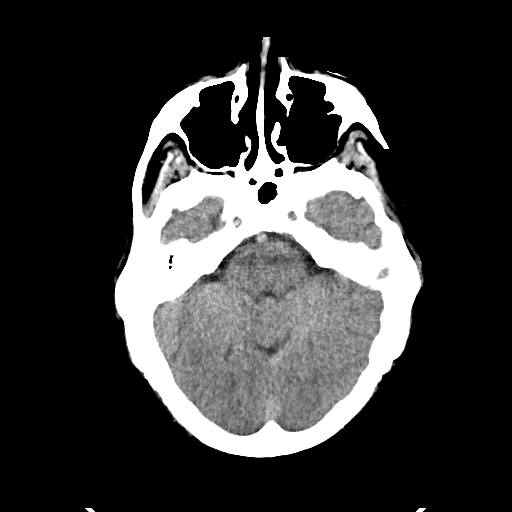
[im 13/36  brain]
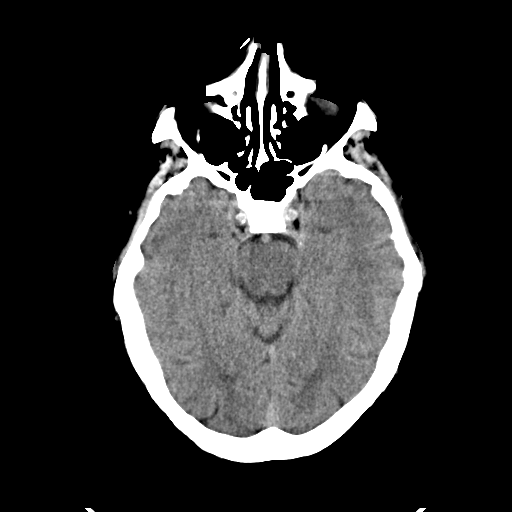
[im 13/36  bone]
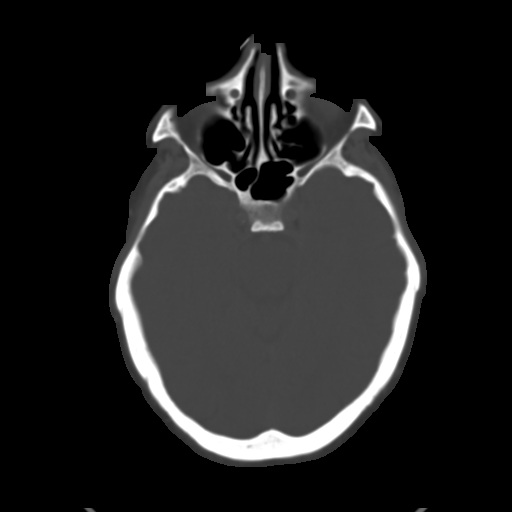
[im 16/36  brain]
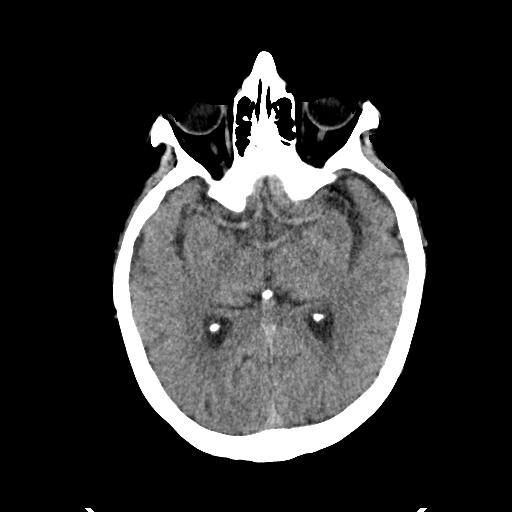
[im 18/36  brain]
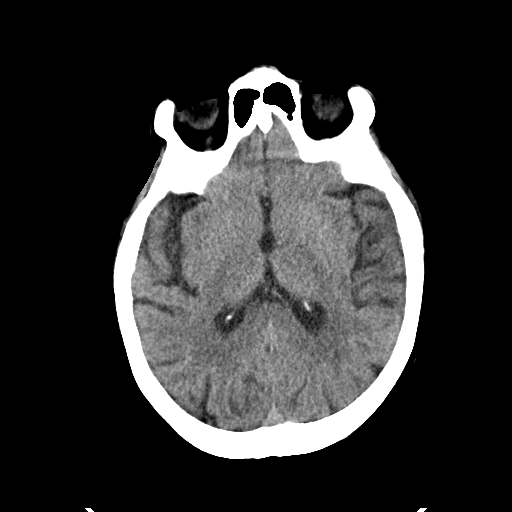
[im 21/36  brain]
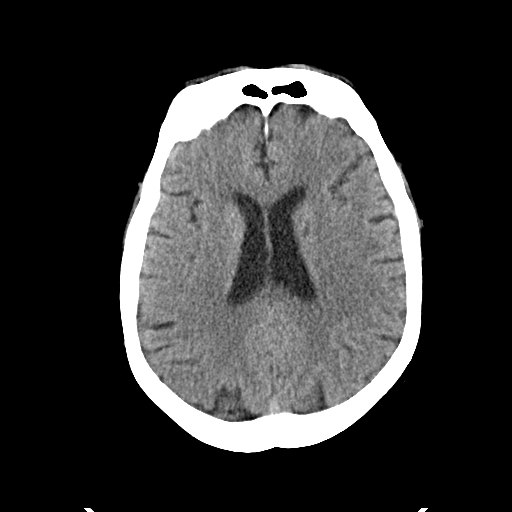
[im 23/36  brain]
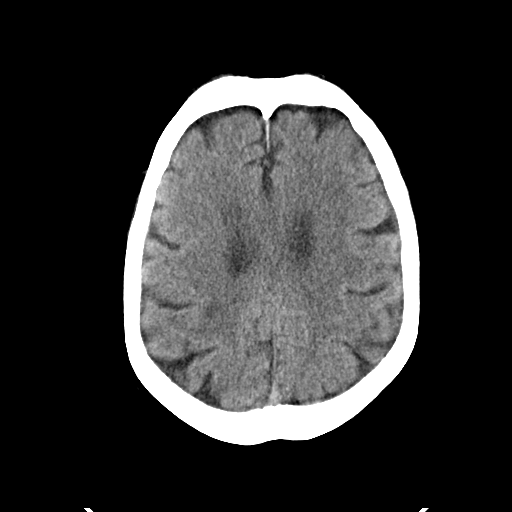
[im 23/36  bone]
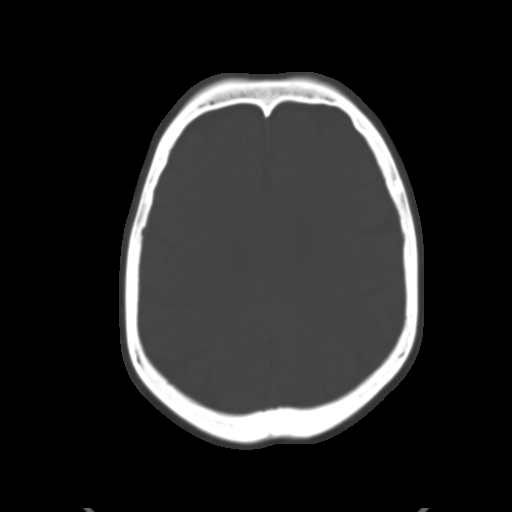
[im 26/36  brain]
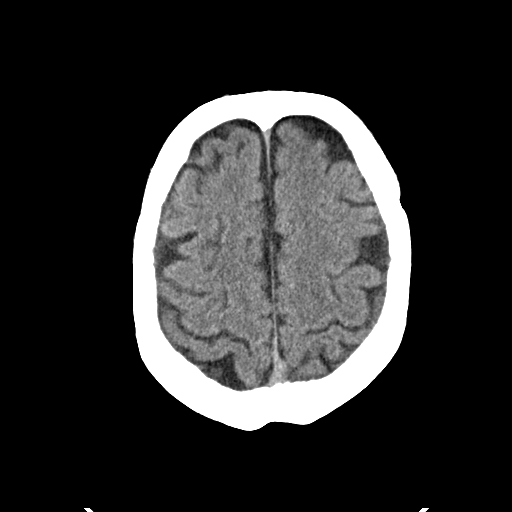
[im 28/36  brain]
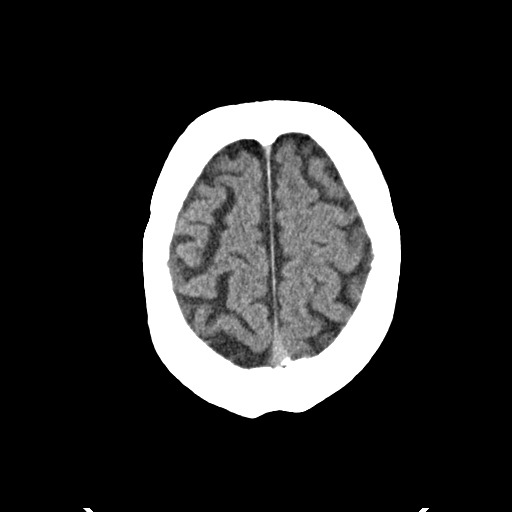
[im 31/36  brain]
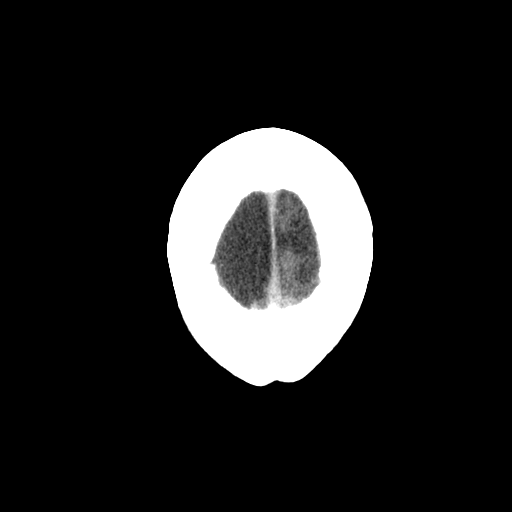
[im 33/36  brain]
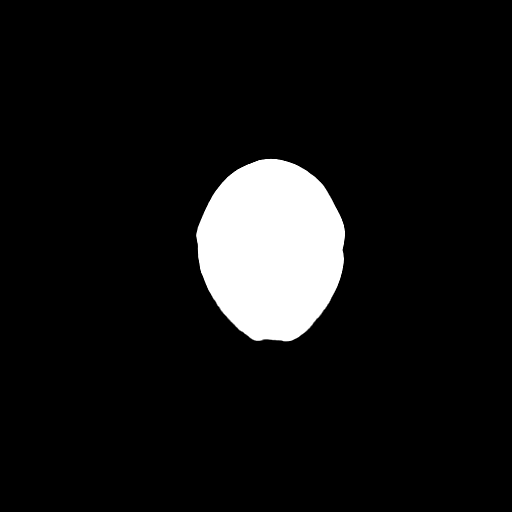
[im 33/36  bone]
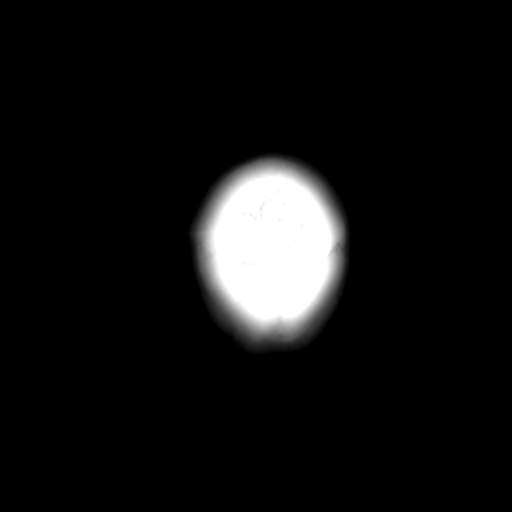

[Series 4: routine head bone · axial · 0.48mm/px · z∈[-22,+2]mm · 2 of 36 slices shown]
[im 3/36  bone]
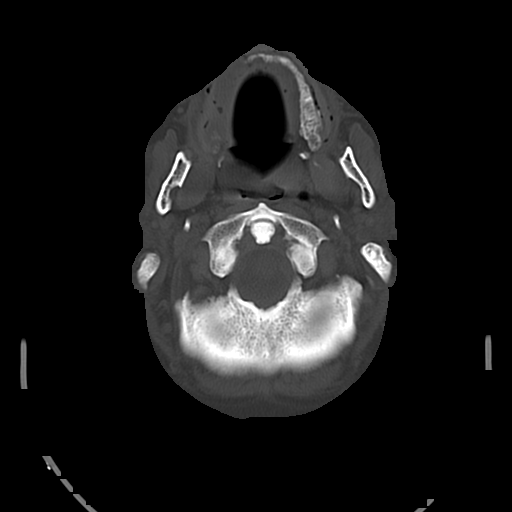
[im 8/36  bone]
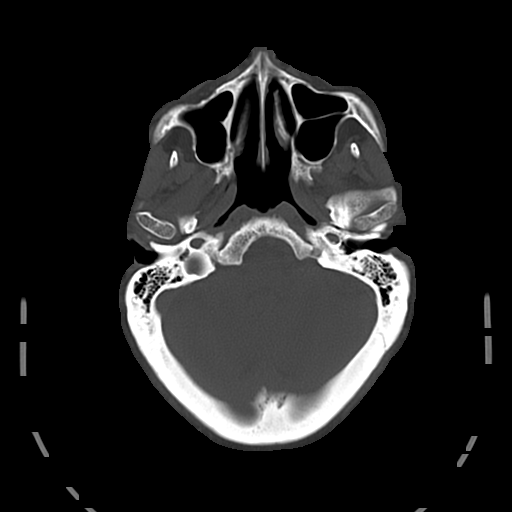

[15 of 30 positions shown; findings below may reference images not displayed]

FINDINGS: Brain: Hypodensities are seen within the bilateral basal ganglia and
periventricular white matter, most pronounced within the bilateral
frontal and right parietal regions. This is compatible with age
indeterminate small vessel ischemic changes, favor chronic.

No other signs of acute infarct or hemorrhage. The lateral
ventricles and remaining midline structures are unremarkable. No
acute extra-axial fluid collections. No mass effect.

Vascular: No hyperdense vessel or unexpected calcification.

Skull: Normal. Negative for fracture or focal lesion.

Sinuses/Orbits: No acute finding.

Other: None
IMPRESSION: 1. Likely chronic small vessel ischemic changes within the
periventricular white matter and bilateral basal ganglia.
2. Otherwise no acute intracranial process.

## 2020-09-21 ENCOUNTER — Encounter (INDEPENDENT_AMBULATORY_CARE_PROVIDER_SITE_OTHER): Payer: Self-pay | Admitting: Vascular Surgery

## 2020-09-21 NOTE — Progress Notes (Signed)
MRN : [id number]  Anthony Morrison is a 79 y.o. ([birth year]) male who presents with chief complaint of  Chief Complaint  Patient presents with  . Follow-up    U/S follow up  .  History of Present Illness:   The patient is seen in follow up for carotid stenosis.  The patient denies amaurosis fugax. There is no recent history of TIA symptoms or focal motor deficits. There is no prior documented CVA.  There is no history of migraine headaches. There is no history of seizures.  The patient is alsoseen for evaluation of painful lower extremities and diminished pulses. Patient notes the pain is always associated with activity and is very consistent day today. Typically, the pain occurs at less than one block, progress is as activity continues to the point that the patient must stop walking. Resting including standing still for several minutes allowed resumption of the activity and the ability to walk a similar distance before stopping again. Uneven terrain and inclined shorten the distance. The pain has been progressive over the past several years. The patientdeniesinability to walk ashaving a negative impact on quality of life and daily activities.  The patient denies rest pain or dangling of an extremity off the side of the bed during the night for relief. No open wounds or sores at this time. No prior interventions or surgeries.  The patient denies history of DVT, PE or superficial thrombophlebitis. The patient denies recent episodes of angina or shortness of breath.  The patient is taking enteric-coated aspirin 81 mg daily.  The patient has a history of coronary artery diseases/p recent CABG right GSV harvested, no recent episodes of angina or shortness of breath. There is a history of hyperlipidemia which is being treated with a statin.  Duplex ultrasound of the carotid arteries chronic occlusion of the right carotid and <60% LICA stenosis. No change compared to last  study.  ABI's Rt=0.74 and Lt=0.93, no change compared to last study  Current Meds  Medication Sig  . lisinopril (ZESTRIL) 10 MG tablet Take 1 tablet (10 mg total) by mouth daily.  . metoprolol tartrate (LOPRESSOR) 50 MG tablet Take 50 mg by mouth 2 (two) times daily.  . rosuvastatin (CRESTOR) 10 MG tablet Take 10 mg by mouth at bedtime.    Past Medical History:  Diagnosis Date  . Arthritis   . Bronchitis   . Cancer ([place])    SKIN CANCERS  . COPD (chronic obstructive pulmonary disease) (HCC)    MILD PER PT  . Pneumonia 2016    Past Surgical History:  Procedure Laterality Date  . COLONOSCOPY  2015  . CORONARY ARTERY BYPASS GRAFT N/A 01/14/2019   Procedure: Emergency Coronary Artery Bypass Grafting (CABG)  times three using left internal mammary artery to LAD, reversed saphenous vein graft to OM and RCA;  Surgeon: Grace Isaac, MD;  Location: [place];  Service: Open Heart Surgery;  Laterality: N/A;  . ENDOVEIN HARVEST OF GREATER SAPHENOUS VEIN Right 01/14/2019   Procedure: ENDOVEIN HARVEST OF GREATER SAPHENOUS VEIN;  Surgeon: Grace Isaac, MD;  Location: [place];  Service: Open Heart Surgery;  Laterality: Right;  . EYE SURGERY     CATARACTS BIL  . LEFT HEART CATH AND CORONARY ANGIOGRAPHY Left 01/14/2019   Procedure: LEFT HEART CATH AND CORONARY ANGIOGRAPHY;  Surgeon: Corey Skains, MD;  Location: [facility];  Service: Cardiovascular;  Laterality: Left;  . TEE WITHOUT CARDIOVERSION N/A 01/14/2019   Procedure: TRANSESOPHAGEAL  ECHOCARDIOGRAM (TEE);  Surgeon: Grace Isaac, MD;  Location: [place];  Service: Open Heart Surgery;  Laterality: N/A;    Social History Social History   Tobacco Use  . Smoking status: Former Smoker    Packs/day: 1.00    Years: 50.00    Pack years: 50.00    Types: Cigarettes    Quit date: 01/06/2015    Years since quitting: 5.7  . Smokeless tobacco: Never Used  Vaping Use  . Vaping Use: Never used  Substance Use Topics  .  Alcohol use: Yes    Comment: OCC   . Drug use: Never    Family History History reviewed. No pertinent family history.  Allergies  Allergen Reactions  . Prednisone Itching  . Sulfa Antibiotics Other (See Comments)    Unknown     REVIEW OF SYSTEMS (Negative unless checked)  Constitutional: [] Weight loss  [] Fever  [] Chills Cardiac: [] Chest pain   [] Chest pressure   [] Palpitations   [] Shortness of breath when laying flat   [] Shortness of breath with exertion. Vascular:  [x] Pain in legs with walking   [] Pain in legs at rest  [] History of DVT   [] Phlebitis   [] Swelling in legs   [] Varicose veins   [] Non-healing ulcers Pulmonary:   [] Uses home oxygen   [] Productive cough   [] Hemoptysis   [] Wheeze  [x] COPD   [] Asthma Neurologic:  [] Dizziness   [] Seizures   [] History of stroke   [] History of TIA  [] Aphasia   [] Vissual changes   [] Weakness or numbness in arm   [] Weakness or numbness in leg Musculoskeletal:   [] Joint swelling   [] Joint pain   [] Low back pain Hematologic:  [] Easy bruising  [] Easy bleeding   [] Hypercoagulable state   [] Anemic Gastrointestinal:  [] Diarrhea   [] Vomiting  [] Gastroesophageal reflux/heartburn   [] Difficulty swallowing. Genitourinary:  [] Chronic kidney disease   [] Difficult urination  [] Frequent urination   [] Blood in urine Skin:  [] Rashes   [] Ulcers  Psychological:  [] History of anxiety   []  History of major depression.  Physical Examination  Vitals:   09/13/20 1421  BP: (!) 160/70  Pulse: 61  Weight: 164 lb (74.4 kg)  Height: 5\' 6"  (1.676 m)   Body mass index is 26.47 kg/m. Gen: WD/WN, NAD Head: [place]/AT, No temporalis wasting.  Ear/Nose/Throat: Hearing grossly intact, nares w/o erythema or drainage Eyes: PER, EOMI, sclera nonicteric.  Neck: Supple, no large masses.   Pulmonary:  Good air movement, no audible wheezing bilaterally, no use of accessory muscles.  Cardiac: RRR, no JVD Vascular:  Carotid bruits noted Vessel Right Left  Radial Palpable  Palpable  Carotid Palpable Palpable  PT Not Palpable Trace Palpable  DP Not Palpable Trace Palpable  Gastrointestinal: Non-distended. No guarding/no peritoneal signs.  Musculoskeletal: M/S 5/5 throughout.  No deformity or atrophy.  Neurologic: CN 2-12 intact. Symmetrical.  Speech is fluent. Motor exam as listed above. Psychiatric: Judgment intact, Mood & affect appropriate for pt's clinical situation. Dermatologic: No rashes or ulcers noted.  No changes consistent with cellulitis.   CBC Lab Results  Component Value Date   WBC 8.5 02/03/2020   HGB 15.4 02/03/2020   HCT 46.7 02/03/2020   MCV 93.8 02/03/2020   PLT 168 02/03/2020    BMET    Component Value Date/Time   NA 136 02/03/2020 0025   K 4.4 02/03/2020 0025   CL 106 02/03/2020 0025   CO2 22 02/03/2020 0025   GLUCOSE 84 02/03/2020 0025   BUN 37 (H) 02/03/2020 0025  CREATININE 1.15 02/03/2020 0025   CALCIUM 8.3 (L) 02/03/2020 0025   GFRNONAA >60 02/03/2020 0025   GFRAA >60 02/03/2020 0025   CrCl cannot be calculated (Patient's most recent lab result is older than the maximum 21 days allowed.).  COAG Lab Results  Component Value Date   INR 1.73 01/14/2019   INR 1.11 01/14/2019    Radiology VAS [place] ABI WITH/WO TBI  Result Date: 09/13/2020 LOWER EXTREMITY DOPPLER STUDY Indications: Peripheral artery disease.  Comparison Study: 09/01/2019 Performing Technologist: Charlane Ferretti RT (R)(VS)  Examination Guidelines: A complete evaluation includes at minimum, Doppler waveform signals and systolic blood pressure reading at the level of bilateral brachial, anterior tibial, and posterior tibial arteries, when vessel segments are accessible. Bilateral testing is considered an integral part of a complete examination. Photoelectric Plethysmograph (PPG) waveforms and toe systolic pressure readings are included as required and additional duplex testing as needed. Limited examinations for reoccurring indications may be performed as  noted.  ABI Findings: +---------+------------------+-----+----------+--------+ Right    Rt Pressure (mmHg)IndexWaveform  Comment  +---------+------------------+-----+----------+--------+ Brachial 185                                       +---------+------------------+-----+----------+--------+ ATA      115               0.62 monophasic         +---------+------------------+-----+----------+--------+ PTA      137               0.74 monophasic         +---------+------------------+-----+----------+--------+ Great Toe99                0.54 Abnormal           +---------+------------------+-----+----------+--------+ +---------+------------------+-----+---------+-------+ Left     Lt Pressure (mmHg)IndexWaveform Comment +---------+------------------+-----+---------+-------+ Brachial 175                                     +---------+------------------+-----+---------+-------+ ATA      163               0.88 triphasic        +---------+------------------+-----+---------+-------+ PTA      172               0.93 triphasic        +---------+------------------+-----+---------+-------+ [place]               0.63 Normal           +---------+------------------+-----+---------+-------+ +-------+-----------+-----------+------------+------------+ ABI/TBIToday's ABIToday's TBIPrevious ABIPrevious TBI +-------+-----------+-----------+------------+------------+ Right  .74        .54        .68         .36          +-------+-----------+-----------+------------+------------+ Left   .93        .63        .83         .58          +-------+-----------+-----------+------------+------------+ Bilateral ABIs appear essentially unchanged compared to prior study on 09/01/2019. Bilateral TBIs appear essentially unchanged compared to prior study on 09/01/2019.  Summary: Right: Resting right ankle-brachial index indicates moderate right lower extremity arterial disease.  The right toe-brachial index is abnormal. Left: Resting left ankle-brachial index indicates mild left lower extremity arterial disease. The left toe-brachial index  is abnormal. *See table(s) above for measurements and observations.  Electronically signed by Hortencia Pilar MD on 09/13/2020 at 5:00:10 PM.   Final    VAS US CAROTID  Result Date: 09/13/2020 Carotid Arterial Duplex Study Indications:       Rt ICA occlusion hx. Comparison Study:  09/01/2019 Performing Technologist: Charlane Ferretti RT (R)(VS)  Examination Guidelines: A complete evaluation includes B-mode imaging, spectral Doppler, color Doppler, and power Doppler as needed of all accessible portions of each vessel. Bilateral testing is considered an integral part of a complete examination. Limited examinations for reoccurring indications may be performed as noted.  Right Carotid Findings: +----------+--------+--------+--------+------------------+------------------+           PSV cm/sEDV cm/sStenosisPlaque DescriptionComments           +----------+--------+--------+--------+------------------+------------------+ CCA Prox  85      2                                 intimal thickening +----------+--------+--------+--------+------------------+------------------+ CCA Mid   58      3                                 intimal thickening +----------+--------+--------+--------+------------------+------------------+ CCA Distal52      1               calcific          intimal thickening +----------+--------+--------+--------+------------------+------------------+ ICA Prox                  Occludedcalcific                             +----------+--------+--------+--------+------------------+------------------+ ICA Mid                   Occluded                                     +----------+--------+--------+--------+------------------+------------------+ ICA Distal                Occluded                                      +----------+--------+--------+--------+------------------+------------------+ ECA       211     8                                                    +----------+--------+--------+--------+------------------+------------------+ +----------+--------+-------+-----------+-------------------+           PSV cm/sEDV cmsDescribe   Arm Pressure (mmHG) +----------+--------+-------+-----------+-------------------+ Subclavian270            Multiphasic                    +----------+--------+-------+-----------+-------------------+ +---------+--------+--+--------+--+---------+ VertebralPSV cm/s49EDV cm/s10Antegrade +---------+--------+--+--------+--+---------+ Left Carotid Findings: +----------+--------+--------+--------+------------------+-------------------+           PSV cm/sEDV cm/sStenosisPlaque DescriptionComments            +----------+--------+--------+--------+------------------+-------------------+ CCA Prox  97      24                                                    +----------+--------+--------+--------+------------------+-------------------+  CCA Mid   94      27                                intimal thickening  +----------+--------+--------+--------+------------------+-------------------+ CCA Distal95      21              heterogenous      intimal thickening  +----------+--------+--------+--------+------------------+-------------------+ ICA Prox  100     26                                ICA/CCA ratio = 1.1 +----------+--------+--------+--------+------------------+-------------------+ ICA Mid   98      32                                                    +----------+--------+--------+--------+------------------+-------------------+ ICA Distal99      30                                                    +----------+--------+--------+--------+------------------+-------------------+ ECA       258     27                                                     +----------+--------+--------+--------+------------------+-------------------+ +----------+--------+--------+-----------+-------------------+           PSV cm/sEDV cm/sDescribe   Arm Pressure (mmHG) +----------+--------+--------+-----------+-------------------+ Subclavian224             Multiphasic                    +----------+--------+--------+-----------+-------------------+ +---------+--------+--+--------+--+---------+ VertebralPSV cm/s73EDV cm/s17Antegrade +---------+--------+--+--------+--+---------+ Summary: Right Carotid: Evidence consistent with a total occlusion of the right ICA. The                ECA appears >50% stenosed. Left Carotid: Velocities in the left ICA are consistent with a 1-39% stenosis.               The ECA appears >50% stenosed. Vertebrals:  Bilateral vertebral arteries demonstrate antegrade flow. Subclavians: Bilateral subclavian arteries were stenotic. *See table(s) above for measurements and observations.  Electronically signed by Hortencia Pilar MD on 09/13/2020 at 5:00:19 PM.    Final      Assessment/Plan 1. Carotid stenosis, asymptomatic, bilateral Recommend:  Given the patient's asymptomatic subcritical stenosis no further invasive testing or surgery at this time.  Duplex ultrasound showsOcclusion of the RICA and 17-40% LICA.  Continue antiplatelet therapy as prescribed Continue management of CAD, HTN and Hyperlipidemia Healthy heart diet, encouraged exercise at least 4 times per week Follow up in28months with duplex ultrasound and physical exam  - VAS US CAROTID; Future  2. Atherosclerosis of native artery of both lower extremities with intermittent claudication (HCC) Recommend:  The patient has evidence of atherosclerosis of the lower extremities with claudication. The patient does not voice lifestyle limiting changes at this point in time.  Noninvasive studies do not suggest clinically significant  change.  No invasive studies, angiography or surgery at this time The patient should continue walking and begin a more formal exercise program.  The patient should continue antiplatelet therapy and aggressive treatment of the lipid abnormalities  No changes in the patient's medications at this time - VAS [place] ABI WITH/WO TBI; Future  3. Stable angina (HCC) Continue cardiac and antihypertensive medications as already ordered and reviewed, no changes at this time.  Continue statin as ordered and reviewed, no changes at this time  Nitrates PRN for chest pain   4. Benign essential HTN Continue antihypertensive medications as already ordered, these medications have been reviewed and there are no changes at this time.   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      Hortencia Pilar, MD  09/21/2020 5:25 PM

## 2020-10-03 DIAGNOSIS — Z23 Encounter for immunization: Secondary | ICD-10-CM | POA: Diagnosis not present

## 2020-11-05 DIAGNOSIS — I739 Peripheral vascular disease, unspecified: Secondary | ICD-10-CM | POA: Diagnosis not present

## 2020-11-08 DIAGNOSIS — Z03818 Encounter for observation for suspected exposure to other biological agents ruled out: Secondary | ICD-10-CM | POA: Diagnosis not present

## 2020-11-08 DIAGNOSIS — J069 Acute upper respiratory infection, unspecified: Secondary | ICD-10-CM | POA: Diagnosis not present

## 2020-11-08 DIAGNOSIS — I440 Atrioventricular block, first degree: Secondary | ICD-10-CM | POA: Diagnosis not present

## 2020-11-08 DIAGNOSIS — R030 Elevated blood-pressure reading, without diagnosis of hypertension: Secondary | ICD-10-CM | POA: Diagnosis not present

## 2020-11-12 DIAGNOSIS — Z125 Encounter for screening for malignant neoplasm of prostate: Secondary | ICD-10-CM | POA: Diagnosis not present

## 2020-11-12 DIAGNOSIS — J431 Panlobular emphysema: Secondary | ICD-10-CM | POA: Diagnosis not present

## 2020-11-12 DIAGNOSIS — E538 Deficiency of other specified B group vitamins: Secondary | ICD-10-CM | POA: Diagnosis not present

## 2020-11-12 DIAGNOSIS — I739 Peripheral vascular disease, unspecified: Secondary | ICD-10-CM | POA: Diagnosis not present

## 2020-11-12 DIAGNOSIS — E782 Mixed hyperlipidemia: Secondary | ICD-10-CM | POA: Diagnosis not present

## 2020-12-17 DIAGNOSIS — I10 Essential (primary) hypertension: Secondary | ICD-10-CM | POA: Diagnosis not present

## 2020-12-17 DIAGNOSIS — I6523 Occlusion and stenosis of bilateral carotid arteries: Secondary | ICD-10-CM | POA: Diagnosis not present

## 2020-12-17 DIAGNOSIS — E782 Mixed hyperlipidemia: Secondary | ICD-10-CM | POA: Diagnosis not present

## 2020-12-17 DIAGNOSIS — I2510 Atherosclerotic heart disease of native coronary artery without angina pectoris: Secondary | ICD-10-CM | POA: Diagnosis not present

## 2021-02-06 DIAGNOSIS — H26492 Other secondary cataract, left eye: Secondary | ICD-10-CM | POA: Diagnosis not present

## 2021-02-28 DIAGNOSIS — H26492 Other secondary cataract, left eye: Secondary | ICD-10-CM | POA: Diagnosis not present

## 2021-05-07 DIAGNOSIS — Z125 Encounter for screening for malignant neoplasm of prostate: Secondary | ICD-10-CM | POA: Diagnosis not present

## 2021-05-07 DIAGNOSIS — E782 Mixed hyperlipidemia: Secondary | ICD-10-CM | POA: Diagnosis not present

## 2021-05-07 DIAGNOSIS — E538 Deficiency of other specified B group vitamins: Secondary | ICD-10-CM | POA: Diagnosis not present

## 2021-05-14 DIAGNOSIS — C44300 Unspecified malignant neoplasm of skin of unspecified part of face: Secondary | ICD-10-CM | POA: Diagnosis not present

## 2021-05-14 DIAGNOSIS — I471 Supraventricular tachycardia: Secondary | ICD-10-CM | POA: Diagnosis not present

## 2021-05-14 DIAGNOSIS — E782 Mixed hyperlipidemia: Secondary | ICD-10-CM | POA: Diagnosis not present

## 2021-05-14 DIAGNOSIS — Z0000 Encounter for general adult medical examination without abnormal findings: Secondary | ICD-10-CM | POA: Diagnosis not present

## 2021-05-14 DIAGNOSIS — N1832 Chronic kidney disease, stage 3b: Secondary | ICD-10-CM | POA: Diagnosis not present

## 2021-05-14 DIAGNOSIS — J431 Panlobular emphysema: Secondary | ICD-10-CM | POA: Diagnosis not present

## 2021-05-14 DIAGNOSIS — I739 Peripheral vascular disease, unspecified: Secondary | ICD-10-CM | POA: Diagnosis not present

## 2021-05-14 DIAGNOSIS — I82443 Acute embolism and thrombosis of tibial vein, bilateral: Secondary | ICD-10-CM | POA: Diagnosis not present

## 2021-05-14 DIAGNOSIS — R972 Elevated prostate specific antigen [PSA]: Secondary | ICD-10-CM | POA: Diagnosis not present

## 2021-05-14 DIAGNOSIS — E538 Deficiency of other specified B group vitamins: Secondary | ICD-10-CM | POA: Diagnosis not present

## 2021-05-27 DIAGNOSIS — D485 Neoplasm of uncertain behavior of skin: Secondary | ICD-10-CM | POA: Diagnosis not present

## 2021-05-27 DIAGNOSIS — C44329 Squamous cell carcinoma of skin of other parts of face: Secondary | ICD-10-CM | POA: Diagnosis not present

## 2021-05-27 DIAGNOSIS — L570 Actinic keratosis: Secondary | ICD-10-CM | POA: Diagnosis not present

## 2021-05-27 DIAGNOSIS — C44319 Basal cell carcinoma of skin of other parts of face: Secondary | ICD-10-CM | POA: Diagnosis not present

## 2021-06-18 DIAGNOSIS — C44329 Squamous cell carcinoma of skin of other parts of face: Secondary | ICD-10-CM | POA: Diagnosis not present

## 2021-07-02 DIAGNOSIS — C4491 Basal cell carcinoma of skin, unspecified: Secondary | ICD-10-CM | POA: Diagnosis not present

## 2021-07-02 DIAGNOSIS — C44319 Basal cell carcinoma of skin of other parts of face: Secondary | ICD-10-CM | POA: Diagnosis not present

## 2021-09-12 ENCOUNTER — Encounter (INDEPENDENT_AMBULATORY_CARE_PROVIDER_SITE_OTHER): Payer: PPO

## 2021-09-12 ENCOUNTER — Ambulatory Visit (INDEPENDENT_AMBULATORY_CARE_PROVIDER_SITE_OTHER): Payer: PPO | Admitting: Vascular Surgery

## 2021-11-13 DIAGNOSIS — E538 Deficiency of other specified B group vitamins: Secondary | ICD-10-CM | POA: Diagnosis not present

## 2021-11-13 DIAGNOSIS — R972 Elevated prostate specific antigen [PSA]: Secondary | ICD-10-CM | POA: Diagnosis not present

## 2021-11-13 DIAGNOSIS — E782 Mixed hyperlipidemia: Secondary | ICD-10-CM | POA: Diagnosis not present

## 2021-11-20 DIAGNOSIS — I739 Peripheral vascular disease, unspecified: Secondary | ICD-10-CM | POA: Diagnosis not present

## 2021-11-20 DIAGNOSIS — Z23 Encounter for immunization: Secondary | ICD-10-CM | POA: Diagnosis not present

## 2021-11-20 DIAGNOSIS — Z125 Encounter for screening for malignant neoplasm of prostate: Secondary | ICD-10-CM | POA: Diagnosis not present

## 2021-11-20 DIAGNOSIS — E538 Deficiency of other specified B group vitamins: Secondary | ICD-10-CM | POA: Diagnosis not present

## 2021-11-20 DIAGNOSIS — E782 Mixed hyperlipidemia: Secondary | ICD-10-CM | POA: Diagnosis not present

## 2022-05-26 DIAGNOSIS — Z125 Encounter for screening for malignant neoplasm of prostate: Secondary | ICD-10-CM | POA: Diagnosis not present

## 2022-05-26 DIAGNOSIS — E538 Deficiency of other specified B group vitamins: Secondary | ICD-10-CM | POA: Diagnosis not present

## 2022-05-26 DIAGNOSIS — E782 Mixed hyperlipidemia: Secondary | ICD-10-CM | POA: Diagnosis not present

## 2022-06-02 DIAGNOSIS — J431 Panlobular emphysema: Secondary | ICD-10-CM | POA: Diagnosis not present

## 2022-06-02 DIAGNOSIS — I739 Peripheral vascular disease, unspecified: Secondary | ICD-10-CM | POA: Diagnosis not present

## 2022-06-02 DIAGNOSIS — E538 Deficiency of other specified B group vitamins: Secondary | ICD-10-CM | POA: Diagnosis not present

## 2022-06-02 DIAGNOSIS — R0609 Other forms of dyspnea: Secondary | ICD-10-CM | POA: Diagnosis not present

## 2022-06-02 DIAGNOSIS — R0600 Dyspnea, unspecified: Secondary | ICD-10-CM | POA: Diagnosis not present

## 2022-06-02 DIAGNOSIS — E782 Mixed hyperlipidemia: Secondary | ICD-10-CM | POA: Diagnosis not present

## 2022-06-02 DIAGNOSIS — Z0000 Encounter for general adult medical examination without abnormal findings: Secondary | ICD-10-CM | POA: Diagnosis not present

## 2022-06-02 DIAGNOSIS — I471 Supraventricular tachycardia: Secondary | ICD-10-CM | POA: Diagnosis not present

## 2022-06-04 ENCOUNTER — Other Ambulatory Visit: Payer: Self-pay | Admitting: Internal Medicine

## 2022-06-04 DIAGNOSIS — R918 Other nonspecific abnormal finding of lung field: Secondary | ICD-10-CM

## 2022-06-12 ENCOUNTER — Ambulatory Visit
Admission: RE | Admit: 2022-06-12 | Discharge: 2022-06-12 | Disposition: A | Discharge status: Home / Self Care | Payer: PPO | Source: Ambulatory Visit | Attending: Internal Medicine | Admitting: Internal Medicine

## 2022-06-12 DIAGNOSIS — J439 Emphysema, unspecified: Secondary | ICD-10-CM | POA: Diagnosis not present

## 2022-06-12 DIAGNOSIS — J929 Pleural plaque without asbestos: Secondary | ICD-10-CM | POA: Diagnosis not present

## 2022-06-12 DIAGNOSIS — R918 Other nonspecific abnormal finding of lung field: Secondary | ICD-10-CM

## 2022-06-12 DIAGNOSIS — I700 Atherosclerosis of aorta: Secondary | ICD-10-CM | POA: Diagnosis not present

## 2022-06-12 MED ORDER — IOPAMIDOL (ISOVUE-370) INJECTION 76%
75.0000 mL | Freq: Once | INTRAVENOUS | Status: AC | PRN
  Administered 2022-06-12: 75 mL via INTRAVENOUS

## 2022-06-18 DIAGNOSIS — J431 Panlobular emphysema: Secondary | ICD-10-CM | POA: Diagnosis not present

## 2022-06-18 DIAGNOSIS — R0602 Shortness of breath: Secondary | ICD-10-CM | POA: Diagnosis not present

## 2022-06-18 DIAGNOSIS — J42 Unspecified chronic bronchitis: Secondary | ICD-10-CM | POA: Diagnosis not present

## 2022-06-18 LAB — PULMONARY FUNCTION TEST

## 2022-06-25 ENCOUNTER — Encounter
Admission: RE | Admit: 2022-06-25 | Discharge: 2022-06-25 | Disposition: A | Discharge status: Home / Self Care | Payer: PPO | Source: Ambulatory Visit | Attending: Pulmonary Disease | Admitting: Pulmonary Disease

## 2022-06-25 DIAGNOSIS — J439 Emphysema, unspecified: Secondary | ICD-10-CM

## 2022-06-25 DIAGNOSIS — Z01818 Encounter for other preprocedural examination: Secondary | ICD-10-CM

## 2022-06-25 DIAGNOSIS — J9601 Acute respiratory failure with hypoxia: Secondary | ICD-10-CM

## 2022-06-25 DIAGNOSIS — Z951 Presence of aortocoronary bypass graft: Secondary | ICD-10-CM

## 2022-06-25 DIAGNOSIS — I6523 Occlusion and stenosis of bilateral carotid arteries: Secondary | ICD-10-CM

## 2022-06-25 HISTORY — DX: Diverticulitis of intestine, part unspecified, without perforation or abscess without bleeding: K57.92

## 2022-06-25 HISTORY — DX: Acute kidney failure, unspecified: N17.9

## 2022-06-25 HISTORY — DX: Chronic kidney disease, stage 3 unspecified: N18.30

## 2022-06-25 HISTORY — DX: Gastrointestinal hemorrhage, unspecified: K92.2

## 2022-06-25 HISTORY — DX: Atherosclerotic heart disease of native coronary artery without angina pectoris: I25.10

## 2022-06-25 HISTORY — DX: Hyperlipidemia, unspecified: E78.5

## 2022-06-25 HISTORY — DX: Dyspnea, unspecified: R06.00

## 2022-06-25 HISTORY — DX: Peripheral vascular disease, unspecified: I73.9

## 2022-06-25 HISTORY — DX: Other specified abnormal findings of blood chemistry: R79.89

## 2022-06-25 HISTORY — DX: Occlusion and stenosis of unspecified carotid artery: I65.29

## 2022-06-25 HISTORY — DX: Acute embolism and thrombosis of left tibial vein: I82.442

## 2022-06-25 HISTORY — DX: Essential (primary) hypertension: I10

## 2022-06-25 HISTORY — DX: Sepsis, unspecified organism: A41.9

## 2022-06-25 HISTORY — DX: Other specified abnormalities of plasma proteins: R77.8

## 2022-06-25 HISTORY — DX: Deficiency of other specified B group vitamins: E53.8

## 2022-06-25 HISTORY — DX: Unspecified hemorrhoids: K64.9

## 2022-06-25 HISTORY — DX: Tachycardia, unspecified: R00.0

## 2022-06-25 HISTORY — DX: Other forms of angina pectoris: I20.8

## 2022-06-25 HISTORY — DX: Supraventricular tachycardia: I47.1

## 2022-06-25 HISTORY — DX: Umbilical hernia without obstruction or gangrene: K42.9

## 2022-06-25 NOTE — Patient Instructions (Signed)
Your procedure is scheduled on:06-27-22 Friday Report to the Registration Desk on the 1st floor of the [facility].Then proceed to the 2nd floor Surgery Desk To find out your arrival time, please call [phone] between 1PM - 3PM on:06-26-22 Thursday If your arrival time is 6:00 am, do not arrive prior to that time as the [facility] entrance doors do not open until 6:00 am.  REMEMBER: Instructions that are not followed completely may result in serious medical risk, up to and including death; or upon the discretion of your surgeon and anesthesiologist your surgery may need to be rescheduled.  Do not eat food after midnight the night before surgery.  No gum chewing, lozengers or hard candies.  You may however, drink CLEAR liquids up to 2 hours before you are scheduled to arrive for your surgery. Do not drink anything within 2 hours of your scheduled arrival time.  Clear liquids include: - water  - apple juice without pulp - gatorade (not RED colors) - black coffee or tea (Do NOT add milk or creamers to the coffee or tea) Do NOT drink anything that is not on this list  TAKE THESE MEDICATIONS THE MORNING OF SURGERY WITH A SIP OF WATER: -rosuvastatin (CRESTOR) -Bring your bottle of metoprolol tartrate (LOPRESSOR) to the hospital  Use your TRELEGY ELLIPTA Inhaler at home the day of surgery  One week prior to surgery: Stop Anti-inflammatories (NSAIDS) such as Advil, Aleve, Ibuprofen, Motrin, Naproxen, Naprosyn and Aspirin based products such as Excedrin, Goodys Powder, BC Powder.You may however, take Tylenol if needed for pain up until the day of surgery.  Stop ANY OVER THE COUNTER supplements/vitamins NOW (06-25-22) until after surgery.  No Alcohol for 24 hours before or after surgery.  No Smoking including e-cigarettes for 24 hours prior to surgery.  No chewable tobacco products for at least 6 hours prior to surgery.  No nicotine patches on the day of surgery.  Do not use any  "recreational" drugs for at least a week prior to your surgery.  Please be advised that the combination of cocaine and anesthesia may have negative outcomes, up to and including death. If you test positive for cocaine, your surgery will be cancelled.  On the morning of surgery brush your teeth with toothpaste and water, you may rinse your mouth with mouthwash if you wish. Do not swallow any toothpaste or mouthwash.  Do not wear jewelry, make-up, hairpins, clips or nail polish.  Do not wear lotions, powders, or perfumes.   Do not shave body from the neck down 48 hours prior to surgery just in case you cut yourself which could leave a site for infection.  Also, freshly shaved skin may become irritated if using the CHG soap.  Contact lenses, hearing aids and dentures may not be worn into surgery.  Do not bring valuables to the hospital. [facility] is not responsible for any missing/lost belongings or valuables.   Notify your doctor if there is any change in your medical condition (cold, fever, infection).  Wear comfortable clothing (specific to your surgery type) to the hospital.  After surgery, you can help prevent lung complications by doing breathing exercises.  Take deep breaths and cough every 1-2 hours. Your doctor may order a device called an Incentive Spirometer to help you take deep breaths. When coughing or sneezing, hold a pillow firmly against your incision with both hands. This is called "splinting." Doing this helps protect your incision. It also decreases belly discomfort.  If you  are being admitted to the hospital overnight, leave your suitcase in the car. After surgery it may be brought to your room.  If you are being discharged the day of surgery, you will not be allowed to drive home. You will need a responsible adult (18 years or older) to drive you home and stay with you that night.   If you are taking public transportation, you will need to have a responsible adult  (18 years or older) with you. Please confirm with your physician that it is acceptable to use public transportation.   Please call the [facility] Dept. at [phone] if you have any questions about these instructions.  Surgery Visitation Policy:  Patients undergoing a surgery or procedure may have two family members or support persons with them as long as the person is not COVID-19 positive or experiencing its symptoms.

## 2022-06-26 ENCOUNTER — Encounter
Admission: RE | Admit: 2022-06-26 | Discharge: 2022-06-26 | Disposition: A | Discharge status: Home / Self Care | Payer: PPO | Source: Ambulatory Visit | Attending: Pulmonary Disease | Admitting: Pulmonary Disease

## 2022-06-26 ENCOUNTER — Encounter: Payer: Self-pay | Admitting: Urgent Care

## 2022-06-26 DIAGNOSIS — Z01818 Encounter for other preprocedural examination: Secondary | ICD-10-CM | POA: Insufficient documentation

## 2022-06-26 DIAGNOSIS — Z01810 Encounter for preprocedural cardiovascular examination: Secondary | ICD-10-CM | POA: Diagnosis not present

## 2022-06-26 DIAGNOSIS — Z20822 Contact with and (suspected) exposure to covid-19: Secondary | ICD-10-CM | POA: Insufficient documentation

## 2022-06-26 DIAGNOSIS — I6523 Occlusion and stenosis of bilateral carotid arteries: Secondary | ICD-10-CM | POA: Diagnosis not present

## 2022-06-26 DIAGNOSIS — J9601 Acute respiratory failure with hypoxia: Secondary | ICD-10-CM | POA: Diagnosis not present

## 2022-06-26 DIAGNOSIS — Z951 Presence of aortocoronary bypass graft: Secondary | ICD-10-CM

## 2022-06-26 DIAGNOSIS — J439 Emphysema, unspecified: Secondary | ICD-10-CM | POA: Insufficient documentation

## 2022-06-26 LAB — PROTIME-INR
INR: 1.1 (ref 0.8–1.2)
Prothrombin Time: 14.2 seconds (ref 11.4–15.2)

## 2022-06-26 LAB — APTT: aPTT: 36 seconds (ref 24–36)

## 2022-06-26 MED ORDER — ORAL CARE MOUTH RINSE: 15.0000 mL | Freq: Once | OROMUCOSAL | Status: AC

## 2022-06-26 MED ORDER — LACTATED RINGERS IV SOLN: INTRAVENOUS | Status: DC

## 2022-06-26 MED ORDER — CHLORHEXIDINE GLUCONATE 0.12 % MT SOLN: 15.0000 mL | Freq: Once | OROMUCOSAL | Status: AC

## 2022-06-26 MED ORDER — FAMOTIDINE 20 MG PO TABS: 20.0000 mg | ORAL_TABLET | Freq: Once | ORAL | Status: AC

## 2022-06-27 ENCOUNTER — Encounter
Admission: RE | Disposition: A | Discharge status: Home / Self Care | Payer: Self-pay | Source: Home / Self Care | Attending: Pulmonary Disease

## 2022-06-27 ENCOUNTER — Ambulatory Visit: Payer: PPO | Admitting: Registered Nurse

## 2022-06-27 ENCOUNTER — Ambulatory Visit: Payer: PPO

## 2022-06-27 ENCOUNTER — Ambulatory Visit
Admission: RE | Admit: 2022-06-27 | Discharge: 2022-06-27 | Disposition: A | Discharge status: Home / Self Care | Payer: PPO | Attending: Pulmonary Disease | Admitting: Pulmonary Disease

## 2022-06-27 ENCOUNTER — Other Ambulatory Visit: Payer: Self-pay

## 2022-06-27 DIAGNOSIS — Z85828 Personal history of other malignant neoplasm of skin: Secondary | ICD-10-CM | POA: Insufficient documentation

## 2022-06-27 DIAGNOSIS — Z951 Presence of aortocoronary bypass graft: Secondary | ICD-10-CM | POA: Diagnosis not present

## 2022-06-27 DIAGNOSIS — Z86718 Personal history of other venous thrombosis and embolism: Secondary | ICD-10-CM | POA: Insufficient documentation

## 2022-06-27 DIAGNOSIS — Z79899 Other long term (current) drug therapy: Secondary | ICD-10-CM | POA: Diagnosis not present

## 2022-06-27 DIAGNOSIS — Z87891 Personal history of nicotine dependence: Secondary | ICD-10-CM | POA: Diagnosis not present

## 2022-06-27 DIAGNOSIS — C3432 Malignant neoplasm of lower lobe, left bronchus or lung: Secondary | ICD-10-CM | POA: Insufficient documentation

## 2022-06-27 DIAGNOSIS — Z8616 Personal history of COVID-19: Secondary | ICD-10-CM | POA: Diagnosis not present

## 2022-06-27 DIAGNOSIS — J449 Chronic obstructive pulmonary disease, unspecified: Secondary | ICD-10-CM | POA: Diagnosis not present

## 2022-06-27 DIAGNOSIS — R918 Other nonspecific abnormal finding of lung field: Secondary | ICD-10-CM | POA: Diagnosis not present

## 2022-06-27 DIAGNOSIS — I10 Essential (primary) hypertension: Secondary | ICD-10-CM | POA: Diagnosis not present

## 2022-06-27 HISTORY — PX: VIDEO BRONCHOSCOPY WITH ENDOBRONCHIAL NAVIGATION: SHX6175

## 2022-06-27 HISTORY — PX: VIDEO BRONCHOSCOPY WITH ENDOBRONCHIAL ULTRASOUND: SHX6177

## 2022-06-27 LAB — SARS CORONAVIRUS 2 (TAT 6-24 HRS): SARS Coronavirus 2: NEGATIVE

## 2022-06-27 SURGERY — BRONCHOSCOPY, WITH EBUS
Anesthesia: General

## 2022-06-27 MED ORDER — LIDOCAINE HCL (PF) 1 % IJ SOLN
30.0000 mL | Freq: Once | INTRAMUSCULAR | Status: DC
  Filled 2022-06-27: qty 30

## 2022-06-27 MED ORDER — FAMOTIDINE 20 MG PO TABS
ORAL_TABLET | ORAL | Status: AC
  Administered 2022-06-27: 20 mg via ORAL
  Filled 2022-06-27: qty 1

## 2022-06-27 MED ORDER — ONDANSETRON HCL 4 MG/2ML IJ SOLN
INTRAMUSCULAR | Status: DC | PRN
  Administered 2022-06-27: 4 mg via INTRAVENOUS

## 2022-06-27 MED ORDER — FENTANYL CITRATE (PF) 100 MCG/2ML IJ SOLN
INTRAMUSCULAR | Status: AC
  Filled 2022-06-27: qty 2

## 2022-06-27 MED ORDER — PHENYLEPHRINE 80 MCG/ML (10ML) SYRINGE FOR IV PUSH (FOR BLOOD PRESSURE SUPPORT)
PREFILLED_SYRINGE | INTRAVENOUS | Status: AC
  Filled 2022-06-27: qty 10

## 2022-06-27 MED ORDER — PHENYLEPHRINE HCL 0.25 % NA SOLN
1.0000 | Freq: Four times a day (QID) | NASAL | Status: DC | PRN
  Filled 2022-06-27: qty 15

## 2022-06-27 MED ORDER — METOPROLOL TARTRATE 5 MG/5ML IV SOLN
INTRAVENOUS | Status: DC | PRN
  Administered 2022-06-27 (×2): 1 mg via INTRAVENOUS

## 2022-06-27 MED ORDER — SUGAMMADEX SODIUM 200 MG/2ML IV SOLN
INTRAVENOUS | Status: DC | PRN
  Administered 2022-06-27: 200 mg via INTRAVENOUS

## 2022-06-27 MED ORDER — KETAMINE HCL 10 MG/ML IJ SOLN
INTRAMUSCULAR | Status: DC | PRN
  Administered 2022-06-27: 10 mg via INTRAVENOUS
  Administered 2022-06-27: 20 mg via INTRAVENOUS

## 2022-06-27 MED ORDER — BUTAMBEN-TETRACAINE-BENZOCAINE 2-2-14 % EX AERO
1.0000 | INHALATION_SPRAY | Freq: Once | CUTANEOUS | Status: DC
  Filled 2022-06-27: qty 20

## 2022-06-27 MED ORDER — FENTANYL CITRATE (PF) 100 MCG/2ML IJ SOLN
INTRAMUSCULAR | Status: DC | PRN
  Administered 2022-06-27: 50 ug via INTRAVENOUS

## 2022-06-27 MED ORDER — PHENYLEPHRINE HCL (PRESSORS) 10 MG/ML IV SOLN
INTRAVENOUS | Status: DC | PRN
  Administered 2022-06-27: 160 ug via INTRAVENOUS
  Administered 2022-06-27: 80 ug via INTRAVENOUS
  Administered 2022-06-27: 160 ug via INTRAVENOUS

## 2022-06-27 MED ORDER — PROPOFOL 10 MG/ML IV BOLUS
INTRAVENOUS | Status: AC
  Filled 2022-06-27: qty 20

## 2022-06-27 MED ORDER — FENTANYL CITRATE (PF) 100 MCG/2ML IJ SOLN: 25.0000 ug | INTRAMUSCULAR | Status: DC | PRN

## 2022-06-27 MED ORDER — ROCURONIUM BROMIDE 10 MG/ML (PF) SYRINGE
PREFILLED_SYRINGE | INTRAVENOUS | Status: AC
  Filled 2022-06-27: qty 10

## 2022-06-27 MED ORDER — ONDANSETRON HCL 4 MG/2ML IJ SOLN: 4.0000 mg | Freq: Once | INTRAMUSCULAR | Status: DC | PRN

## 2022-06-27 MED ORDER — ONDANSETRON HCL 4 MG/2ML IJ SOLN
INTRAMUSCULAR | Status: AC
  Filled 2022-06-27: qty 2

## 2022-06-27 MED ORDER — LIDOCAINE HCL (CARDIAC) PF 100 MG/5ML IV SOSY
PREFILLED_SYRINGE | INTRAVENOUS | Status: DC | PRN
  Administered 2022-06-27: 100 mg via INTRAVENOUS

## 2022-06-27 MED ORDER — CHLORHEXIDINE GLUCONATE 0.12 % MT SOLN
OROMUCOSAL | Status: AC
  Administered 2022-06-27: 15 mL via OROMUCOSAL
  Filled 2022-06-27: qty 15

## 2022-06-27 MED ORDER — ROCURONIUM BROMIDE 100 MG/10ML IV SOLN
INTRAVENOUS | Status: DC | PRN
  Administered 2022-06-27 (×2): 50 mg via INTRAVENOUS

## 2022-06-27 MED ORDER — LIDOCAINE HCL URETHRAL/MUCOSAL 2 % EX GEL
1.0000 | Freq: Once | CUTANEOUS | Status: DC
  Filled 2022-06-27: qty 5

## 2022-06-27 MED ORDER — LIDOCAINE HCL (PF) 2 % IJ SOLN
INTRAMUSCULAR | Status: AC
  Filled 2022-06-27: qty 5

## 2022-06-27 MED ORDER — PROPOFOL 10 MG/ML IV BOLUS
INTRAVENOUS | Status: DC | PRN
  Administered 2022-06-27: 120 mg via INTRAVENOUS

## 2022-06-27 MED ORDER — METOPROLOL TARTRATE 5 MG/5ML IV SOLN
INTRAVENOUS | Status: AC
  Filled 2022-06-27: qty 5

## 2022-06-27 MED ORDER — KETAMINE HCL 50 MG/5ML IJ SOSY
PREFILLED_SYRINGE | INTRAMUSCULAR | Status: AC
  Filled 2022-06-27: qty 5

## 2022-06-27 NOTE — Discharge Instructions (Signed)

## 2022-06-27 NOTE — Procedures (Signed)
ELECTROMAGNETIC NAVIGATIONAL BRONCHOSCOPY PROCEDURE NOTE  FIBEROPTIC BRONCHOSCOPY WITH BRONCHOALVEOLAR LAVAGE AND THERAPEUTIC ASPIRATION OF TRACHEOBRONCHIAL TREE PROCEDURE NOTE  ENDOBRONCHIAL ULTRASOUND PROCEDURE NOTE    Flexible bronchoscopy was performed  by : Lanney Gins MD  assistance by : 1)Repiratory therapist  and 2)[employer] staff and 3) Anesthesia team and 4) Flouroscopy team and 5)[facility] supporting staff   Indication for the procedure was :  Pre-procedural H&P. The following assessment was performed on the day of the procedure prior to initiating sedation History:  Chest pain n Dyspnea y Hemoptysis n Cough y Fever n Other pertinent items n  Examination Vital signs -reviewed as per nursing documentation today Cardiac    Murmurs: n  Rubs : n  Gallop: n Lungs Wheezing: n Rales : n Rhonchi :y  Other pertinent findings: SOB/hypoxemia due to chronic lung disease   Pre-procedural assessment for Procedural Sedation included: Depth of sedation: As per anesthesia team  ASA Classification:  2 Mallampati airway assessment: 3    Medication list reviewed: y  The patient's interval history was taken and revealed: no new complaints The pre- procedure physical examination revealed: No new findings Refer to prior clinic note for details.  Informed Consent: Informed consent was obtained from:  patient after explanation of procedure and risks, benefits, as well as alternative procedures available.  Explanation of level of sedation and possible transfusion was also provided.    Procedural Preparation: Time out was performed and patient was identified by name and birthdate and procedure to be performed and side for sampling, if any, was specified. Pt was intubated by anesthesia.  The patient was appropriately draped.   Fiberoptic bronchoscopy with airway inspection and BAL Procedure findings:  Bronchoscope was inserted via ETT  without difficulty.  Posterior  oropharynx, epiglottis, arytenoids, false cords and vocal cords were not visualized as these were bypassed by endotracheal tube. The distal trachea was normal in circumference and appearance without mucosal, cartilaginous or branching abnormalities.  The main carina was mildly splayed . All right and left lobar airways were visualized to the Subsegmental level.  Sub- sub segmental carinae were identified in all the distal airways.   Secretions were visible in the following airways and appeared to be clear.  The mucosa was : normal   Airways were notable for:        exophytic lesions :n       extrinsic compression in the following distributions: n.       Friable mucosa: no       Anthrocotic material /pigmentation: n   Thickened mucus was noted in the lower lobe basal segments and was evacuated with aspiration of tracheobronchial tree.  BAL was performed at the left lower lobe  Specimen sent for microbiology including Gram stain and culture for bacterial growth, fungal culture and AFB atypical culture  Post procedure Diagnosis:   Mucous plugging of left lower lobe     Electromagnetic Navigational Bronchoscopy Procedure Findings:  After appropriate CT-guided planning body vision scope was advanced via endotracheal tube.  Post appropriate planning and registration peripheral navigation was used to visualize target lesion.    Cytobrush x3 Surgical biopsy x6 Bronchoalveolar lavage x1   Post procedure diagnosis: Atypical cells noted by Rose from [place] consistent with lung cancer      Endobronchial ultrasound assisted hilar and mediastinal lymph node biopsies procedure findings: The fiberoptic bronchoscope was removed and the EBUS scope was introduced. Examination began to evaluate for pathologically enlarged lymph nodes starting on the right side progressing to  the left side.  All lymph node biopsies performed with 21-gauge needle. Lymph node biopsies were sent in cytolite for all  stations.  Station 10 R was 0.7 cm-not biopsied Station 7 was 1.4 cm biopsied 3 times Station 10 L was 1.7 cm biopsied 3 times Station 11 L was 1.2 cm biopsy 3 times  All specimens sent to cytology for additional pathologic evaluation   Post procedure diagnosis: Mediastinal and hilar lymphadenopathy suggestive of possible metastatic involvement   Specimens obtained included:                    Cytology brushes : Left lower lobe x3  Broncho-alveolar lavage site: Left lower lobe sent for cytology and microbiology                             60 ml volume infused 30 ml volume returned with bloody appearance Immediate sampling complications included: None Epinephrine 0 ml was used topically  The bronchoscopy was terminated due to completion of the planned procedure and the bronchoscope was removed.   Total dosage of Lidocaine was 0 mg Total fluoroscopy time was as per radiology in minutes   Estimated Blood loss: Expected less than 10 cc.  Complications included: None immediate  Preliminary CXR findings :  In process  Disposition: Home with family  Follow up with Dr. Lanney Gins in 5 days for result discussion.     Ottie Glazier MD  [facility] of Pul[facility] & Critical Care Medicine

## 2022-06-27 NOTE — H&P (Signed)
PULMONOLOGY         Date: 06/27/2022,   MRN# [id number] Anthony Morrison Oct 21, 1941     AdmissionWeight: 19 kg                 CurrentWeight: 53 kg     CHIEF COMPLAINT:   Left lower lobe cavitary mass   HISTORY OF PRESENT ILLNESS   This is a 81 year old male with a history of DVT, osteoarthritis, skin cancer, carotid stenosis, COPD and centrilobular emphysema, dyslipidemia, history of GI bleed, chronic vitamin D deficiency, CAD status post CABG, umbilical hernia, history of severe COVID-19 in 2020 requiring hospitalization at [facility] in [place].  Reporting worsening progressive dyspnea while pulmonology clinic.  He started to have non-mass hemoptysis with cough.  Additional evaluation via CT chest shows left lower lobe cavitary mass.  Patient is here today for tissue biopsy of mass concerning for possible bronchogenic carcinoma.  We discussed risks and benefits of procedure.  Plan is to perform navigational bronchoscopy as well as evaluation of lymph node stations via endobronchial ultrasound.  With him his family including his son-in-law, questions were answered and procedure was reviewed today.   PAST MEDICAL HISTORY   Past Medical History:  Diagnosis Date   Acute deep vein thrombosis (DVT) of tibial vein of left lower extremity (HCC)    Acute renal failure superimposed on stage 3 chronic kidney disease (HCC)    Arthritis    ASCVD (arteriosclerotic cardiovascular disease)    Bronchitis    Cancer (HCC)    SKIN CANCERS   Carotid stenosis    bilateral-100%blockage on right carotid   COPD (chronic obstructive pulmonary disease) ([place])    Panlobular emphysema   Coronary artery disease    COVID-19 2020   Diverticulitis    Dyspnea    with exertion   Elevated d-dimer    Elevated troponin    Hemorrhoids    Hyperlipidemia    Hypertension    PAD (peripheral artery disease) ([place])    Pneumonia 2016   PSVT (paroxysmal supraventricular tachycardia)  (HCC)    S/P CABG x 3 2019   Sepsis (HCC)    Stable angina (HCC)    Tachycardia    Umbilical hernia    Upper GI bleed    Vitamin B12 deficiency      SURGICAL HISTORY   Past Surgical History:  Procedure Laterality Date   COLONOSCOPY  2015   CORONARY ARTERY BYPASS GRAFT N/A 01/14/2019   Procedure: Emergency Coronary Artery Bypass Grafting (CABG)  times three using left internal mammary artery to LAD, reversed saphenous vein graft to OM and RCA;  Surgeon: Grace Isaac, MD;  Location: [place];  Service: Open Heart Surgery;  Laterality: N/A;   ENDOVEIN HARVEST OF GREATER SAPHENOUS VEIN Right 01/14/2019   Procedure: ENDOVEIN HARVEST OF GREATER SAPHENOUS VEIN;  Surgeon: Grace Isaac, MD;  Location: [place];  Service: Open Heart Surgery;  Laterality: Right;   EYE SURGERY     CATARACTS BIL   LEFT HEART CATH AND CORONARY ANGIOGRAPHY Left 01/14/2019   Procedure: LEFT HEART CATH AND CORONARY ANGIOGRAPHY;  Surgeon: Corey Skains, MD;  Location: [facility];  Service: Cardiovascular;  Laterality: Left;   TEE WITHOUT CARDIOVERSION N/A 01/14/2019   Procedure: TRANSESOPHAGEAL ECHOCARDIOGRAM (TEE);  Surgeon: Grace Isaac, MD;  Location: [place];  Service: Open Heart Surgery;  Laterality: N/A;     FAMILY HISTORY   History reviewed. No pertinent family history.  SOCIAL HISTORY   Social History   Tobacco Use   Smoking status: Former    Packs/day: 1.00    Years: 50.00    Total pack years: 50.00    Types: Cigarettes    Quit date: 01/06/2015    Years since quitting: 7.4   Smokeless tobacco: Never  Vaping Use   Vaping Use: Never used  Substance Use Topics   Alcohol use: Yes    Comment: OCC    Drug use: Never     MEDICATIONS    Home Medication:    Current Medication:  Current Facility-Administered Medications:    lactated ringers infusion, , Intravenous, Continuous, Martha Clan, MD    ALLERGIES   Prednisone and Sulfa antibiotics     REVIEW OF  SYSTEMS    Review of Systems:  Gen:  Denies  fever, sweats, chills weigh loss  HEENT: Denies blurred vision, double vision, ear pain, eye pain, hearing loss, nose bleeds, sore throat Cardiac:  No dizziness, chest pain or heaviness, chest tightness,edema Resp:   reports dyspnea chronically, none massive hemoptysis Gi: Denies swallowing difficulty, stomach pain, nausea or vomiting, diarrhea, constipation, bowel incontinence Gu:  Denies bladder incontinence, burning urine Ext:   Denies Joint pain, stiffness or swelling Skin: Denies  skin rash, easy bruising or bleeding or hives Endoc:  Denies polyuria, polydipsia , polyphagia or weight change Psych:   Denies depression, insomnia or hallucinations   Other:  All other systems negative   VS: BP 136/86   Pulse 64   Temp (!) 96.9 F (36.1 C) (Temporal)   Resp 17   Ht 5\' 6"  (1.676 m)   Wt 68 kg   SpO2 98%   BMI 24.21 kg/m      PHYSICAL EXAM    GENERAL:NAD, no fevers, chills, no weakness no fatigue HEAD: Normocephalic, atraumatic.  EYES: Pupils equal, round, reactive to light. Extraocular muscles intact. No scleral icterus.  MOUTH: Moist mucosal membrane. Dentition intact. No abscess noted.  EAR, NOSE, THROAT: Clear without exudates. No external lesions.  NECK: Supple. No thyromegaly. No nodules. No JVD.  PULMONARY: decreased breath sounds with mild rhonchi worse at bases bilaterally.  CARDIOVASCULAR: S1 and S2. Regular rate and rhythm. No murmurs, rubs, or gallops. No edema. Pedal pulses 2+ bilaterally.  GASTROINTESTINAL: Soft, nontender, nondistended. No masses. Positive bowel sounds. No hepatosplenomegaly.  MUSCULOSKELETAL: No swelling, clubbing, or edema. Range of motion full in all extremities.  NEUROLOGIC: Cranial nerves II through XII are intact. No gross focal neurological deficits. Sensation intact. Reflexes intact.  SKIN: No ulceration, lesions, rashes, or cyanosis. Skin warm and dry. Turgor intact.  PSYCHIATRIC: Mood,  affect within normal limits. The patient is awake, alert and oriented x 3. Insight, judgment intact.       IMAGING     ASSESSMENT/PLAN   Non- massive hemoptysis with left lower lobe cavitary mass Concerning for bronchogenic carcinoma -Patient is here today for navigational bronchoscopy and endobronchial ultrasound assisted lymph node biopsies Reviewed in detail procedure with family and patient plan to proceed as discussed. -Reviewed risks/complications and benefits with patient, risks include infection, pneumothorax/pneumomediastinum which may require chest tube placement as well as overnight/prolonged hospitalization and possible mechanical ventilation. Other risks include bleeding and very rarely death.  Patient understands risks and wishes to proceed.  Additional questions were answered, and patient is aware that post procedure patient will be going home with family and may experience cough with possible clots on expectoration as well as phlegm which may last few days  as well as hoarseness of voice post intubation and mechanical ventilation.    Thank you for allowing me to participate in the care of this patient.   Patient/Family are satisfied with care plan and all questions have been answered.    Provider disclosure: Patient with at least one acute or chronic illness or injury that poses a threat to life or bodily function and is being managed actively during this encounter.  All of the below services have been performed independently by signing provider:  review of prior documentation from internal and or external health records.  Review of previous and current lab results.  Interview and comprehensive assessment during patient visit today. Review of current and previous chest radiographs/CT scans. Discussion of management and test interpretation with health care team and patient/family.   This document was prepared using Dragon voice recognition software and may include  unintentional dictation errors.     Ottie Glazier, M.D.  Division of Pulmonary & Critical Care Medicine

## 2022-06-27 NOTE — Anesthesia Procedure Notes (Signed)
Procedure Name: Intubation Date/Time: 06/27/2022 8:28 AM  Performed by: Lia Foyer, CRNAPre-anesthesia Checklist: Patient identified, Emergency Drugs available, Suction available and Patient being monitored Patient Re-evaluated:Patient Re-evaluated prior to induction Oxygen Delivery Method: Circle system utilized Preoxygenation: Pre-oxygenation with 100% oxygen Induction Type: IV induction Ventilation: Mask ventilation without difficulty Laryngoscope Size: McGraph and 4 Grade View: Grade I Tube type: Oral Tube size: 8.5 mm Number of attempts: 1 Airway Equipment and Method: Stylet and Video-laryngoscopy Placement Confirmation: ETT inserted through vocal cords under direct vision, positive ETCO2 and breath sounds checked- equal and bilateral Secured at: 22 cm Tube secured with: Tape Dental Injury: Teeth and Oropharynx as per pre-operative assessment

## 2022-06-27 NOTE — Anesthesia Preprocedure Evaluation (Signed)
Anesthesia Evaluation  Patient identified by MRN, date of birth, ID band Patient awake    Reviewed: Allergy & Precautions, H&P , NPO status , Patient's Chart, lab work & pertinent test results, reviewed documented beta blocker date and time   Airway Mallampati: II  TM Distance: >3 FB Neck ROM: full    Dental  (+) Teeth Intact, Upper Dentures, Lower Dentures   Pulmonary neg pulmonary ROS, shortness of breath and with exertion, pneumonia, resolved, COPD, Patient abstained from smoking., former smoker,    Pulmonary exam normal        Cardiovascular Exercise Tolerance: Poor hypertension, On Medications (-) anginawith exertion + CAD, + CABG and + Peripheral Vascular Disease  negative cardio ROS Normal cardiovascular exam Rhythm:regular Rate:Normal     Neuro/Psych  Neuromuscular disease negative neurological ROS  negative psych ROS   GI/Hepatic negative GI ROS, Neg liver ROS,   Endo/Other  negative endocrine ROS  Renal/GU Renal diseasenegative Renal ROS  negative genitourinary   Musculoskeletal   Abdominal   Peds  Hematology negative hematology ROS (+)   Anesthesia Other Findings Past Medical History: No date: Acute deep vein thrombosis (DVT) of tibial vein of left  lower extremity (HCC) No date: Acute renal failure superimposed on stage 3 chronic kidney  disease (HCC) No date: Arthritis No date: ASCVD (arteriosclerotic cardiovascular disease) No date: Bronchitis No date: Cancer ([place])     Comment:  SKIN CANCERS No date: Carotid stenosis     Comment:  bilateral-100%blockage on right carotid No date: COPD (chronic obstructive pulmonary disease) (HCC)     Comment:  Panlobular emphysema No date: Coronary artery disease 2020: COVID-19 No date: Diverticulitis No date: Dyspnea     Comment:  with exertion No date: Elevated d-dimer No date: Elevated troponin No date: Hemorrhoids No date: Hyperlipidemia No date:  Hypertension No date: PAD (peripheral artery disease) ([place]) 2016: Pneumonia No date: PSVT (paroxysmal supraventricular tachycardia) ([place]) 2019: S/P CABG x 3 No date: Sepsis ([place]) No date: Stable angina ([place]) No date: Tachycardia No date: Umbilical hernia No date: Upper GI bleed No date: Vitamin B12 deficiency Past Surgical History: 2015: COLONOSCOPY 01/14/2019: CORONARY ARTERY BYPASS GRAFT; N/A     Comment:  Procedure: Emergency Coronary Artery Bypass Grafting               (CABG)  times three using left internal mammary artery to              LAD, reversed saphenous vein graft to OM and RCA;                Surgeon: Grace Isaac, MD;  Location: [place];                Service: Open Heart Surgery;  Laterality: N/A; 01/14/2019: ENDOVEIN HARVEST OF GREATER SAPHENOUS VEIN; Right     Comment:  Procedure: ENDOVEIN HARVEST OF GREATER SAPHENOUS VEIN;                Surgeon: Grace Isaac, MD;  Location: [place];                Service: Open Heart Surgery;  Laterality: Right; No date: EYE SURGERY     Comment:  CATARACTS BIL 01/14/2019: LEFT HEART CATH AND CORONARY ANGIOGRAPHY; Left     Comment:  Procedure: LEFT HEART CATH AND CORONARY ANGIOGRAPHY;                Surgeon: Corey Skains, MD;  Location: [place]  [facility];  Service: Cardiovascular;  Laterality: Left; 01/14/2019: TEE WITHOUT CARDIOVERSION; N/A     Comment:  Procedure: TRANSESOPHAGEAL ECHOCARDIOGRAM (TEE);                Surgeon: Grace Isaac, MD;  Location: [place];                Service: Open Heart Surgery;  Laterality: N/A; BMI    Body Mass Index: 24.21 kg/m     Reproductive/Obstetrics negative OB ROS                             Anesthesia Physical Anesthesia Plan  ASA: 3  Anesthesia Plan: General ETT   Post-op Pain Management:    Induction:   PONV Risk Score and Plan:   Airway Management Planned:   Additional Equipment:   Intra-op Plan:    Post-operative Plan:   Informed Consent: I have reviewed the patients History and Physical, chart, labs and discussed the procedure including the risks, benefits and alternatives for the proposed anesthesia with the patient or authorized representative who has indicated his/her understanding and acceptance.     Dental Advisory Given  Plan Discussed with: CRNA  Anesthesia Plan Comments:         Anesthesia Quick Evaluation

## 2022-06-27 NOTE — Transfer of Care (Signed)
Immediate Anesthesia Transfer of Care Note  Patient: Anthony Morrison  Procedure(s) Performed: VIDEO BRONCHOSCOPY WITH ENDOBRONCHIAL ULTRASOUND VIDEO BRONCHOSCOPY WITH ENDOBRONCHIAL NAVIGATION  Patient Location: PACU  Anesthesia Type:General  Level of Consciousness: drowsy and patient cooperative  Airway & Oxygen Therapy: Patient Spontanous Breathing  Post-op Assessment: Report given to RN and Post -op Vital signs reviewed and stable  Post vital signs: Reviewed and stable  Last Vitals:  Vitals Value Taken Time  BP 145/74 06/27/22 1017  Temp 36.1 C 06/27/22 1015  Pulse 74 06/27/22 1021  Resp 16 06/27/22 1021  SpO2 98 % 06/27/22 1021  Vitals shown include unvalidated device data.  Last Pain:  Vitals:   06/27/22 1015  TempSrc:   PainSc: 0-No pain         Complications: No notable events documented.

## 2022-06-30 ENCOUNTER — Encounter: Payer: Self-pay | Admitting: Pulmonary Disease

## 2022-06-30 LAB — CULTURE, RESPIRATORY W GRAM STAIN
Culture: NO GROWTH
Gram Stain: NONE SEEN

## 2022-07-01 ENCOUNTER — Other Ambulatory Visit: Payer: Self-pay | Admitting: Pathology

## 2022-07-01 ENCOUNTER — Encounter: Payer: Self-pay | Admitting: *Deleted

## 2022-07-01 DIAGNOSIS — C3490 Malignant neoplasm of unspecified part of unspecified bronchus or lung: Secondary | ICD-10-CM

## 2022-07-01 LAB — SURGICAL PATHOLOGY

## 2022-07-01 LAB — CYTOLOGY - NON PAP

## 2022-07-01 NOTE — Anesthesia Postprocedure Evaluation (Signed)
Anesthesia Post Note  Patient: Anthony Morrison  Procedure(s) Performed: VIDEO BRONCHOSCOPY WITH ENDOBRONCHIAL ULTRASOUND VIDEO BRONCHOSCOPY WITH ENDOBRONCHIAL NAVIGATION  Patient location during evaluation: PACU Anesthesia Type: General Level of consciousness: awake and alert Pain management: pain level controlled Vital Signs Assessment: post-procedure vital signs reviewed and stable Respiratory status: spontaneous breathing, nonlabored ventilation, respiratory function stable and patient connected to nasal cannula oxygen Cardiovascular status: blood pressure returned to baseline and stable Postop Assessment: no apparent nausea or vomiting Anesthetic complications: no   No notable events documented.   Last Vitals:  Vitals:   06/27/22 1100 06/27/22 1144  BP: (!) 145/80 121/73  Pulse: 72 63  Resp: 14 14  Temp: (!) 36.1 C   SpO2: 96% 100%    Last Pain:  Vitals:   06/29/22 1258  TempSrc:   PainSc: 0-No pain                 Molli Barrows

## 2022-07-01 NOTE — Progress Notes (Signed)
Referral received today for newly diagnosed lung cancer. Per Dr. Grayland Ormond, pt will need PET scan/Brain MRI and to see him 1-2 days after scan. Orders placed and pt will be called with appts once scheduled.

## 2022-07-02 ENCOUNTER — Encounter: Payer: Self-pay | Admitting: *Deleted

## 2022-07-02 NOTE — Addendum Note (Signed)
Addended by: Telford Nab on: 07/02/2022 09:44 AM   Modules accepted: Orders

## 2022-07-02 NOTE — Progress Notes (Signed)
Phone call made to patient to review upcoming appts for PET scan and brain MRI. All questions answered during call. Contact info given and instructed to call with any questions or needs. Pt verbalized understanding.

## 2022-07-05 LAB — ACID FAST SMEAR (AFB, MYCOBACTERIA): Acid Fast Smear: NEGATIVE

## 2022-07-08 ENCOUNTER — Encounter
Admission: RE | Admit: 2022-07-08 | Discharge: 2022-07-08 | Disposition: A | Discharge status: Home / Self Care | Payer: PPO | Source: Ambulatory Visit | Attending: Oncology | Admitting: Oncology

## 2022-07-08 DIAGNOSIS — C3490 Malignant neoplasm of unspecified part of unspecified bronchus or lung: Secondary | ICD-10-CM | POA: Diagnosis not present

## 2022-07-08 LAB — GLUCOSE, CAPILLARY: Glucose-Capillary: 90 mg/dL (ref 70–99)

## 2022-07-08 MED ORDER — FLUDEOXYGLUCOSE F - 18 (FDG) INJECTION
7.8000 | Freq: Once | INTRAVENOUS | Status: AC | PRN
  Administered 2022-07-08: 8.39 via INTRAVENOUS

## 2022-07-09 ENCOUNTER — Ambulatory Visit
Admission: RE | Admit: 2022-07-09 | Discharge: 2022-07-09 | Disposition: A | Discharge status: Home / Self Care | Payer: PPO | Source: Ambulatory Visit | Attending: Oncology | Admitting: Oncology

## 2022-07-09 DIAGNOSIS — I639 Cerebral infarction, unspecified: Secondary | ICD-10-CM | POA: Diagnosis not present

## 2022-07-09 DIAGNOSIS — I6782 Cerebral ischemia: Secondary | ICD-10-CM | POA: Diagnosis not present

## 2022-07-09 DIAGNOSIS — C3490 Malignant neoplasm of unspecified part of unspecified bronchus or lung: Secondary | ICD-10-CM | POA: Insufficient documentation

## 2022-07-09 MED ORDER — GADOBUTROL 1 MMOL/ML IV SOLN
7.0000 mL | Freq: Once | INTRAVENOUS | Status: AC | PRN
  Administered 2022-07-09: 7 mL via INTRAVENOUS

## 2022-07-10 ENCOUNTER — Inpatient Hospital Stay: Payer: PPO | Attending: Radiation Oncology

## 2022-07-10 DIAGNOSIS — Z86718 Personal history of other venous thrombosis and embolism: Secondary | ICD-10-CM | POA: Insufficient documentation

## 2022-07-10 DIAGNOSIS — C3432 Malignant neoplasm of lower lobe, left bronchus or lung: Secondary | ICD-10-CM | POA: Insufficient documentation

## 2022-07-10 DIAGNOSIS — I2510 Atherosclerotic heart disease of native coronary artery without angina pectoris: Secondary | ICD-10-CM | POA: Insufficient documentation

## 2022-07-10 DIAGNOSIS — K5730 Diverticulosis of large intestine without perforation or abscess without bleeding: Secondary | ICD-10-CM | POA: Insufficient documentation

## 2022-07-10 DIAGNOSIS — I700 Atherosclerosis of aorta: Secondary | ICD-10-CM | POA: Insufficient documentation

## 2022-07-10 DIAGNOSIS — N183 Chronic kidney disease, stage 3 unspecified: Secondary | ICD-10-CM | POA: Insufficient documentation

## 2022-07-10 DIAGNOSIS — Z87891 Personal history of nicotine dependence: Secondary | ICD-10-CM | POA: Insufficient documentation

## 2022-07-10 DIAGNOSIS — Z79899 Other long term (current) drug therapy: Secondary | ICD-10-CM | POA: Insufficient documentation

## 2022-07-10 DIAGNOSIS — I739 Peripheral vascular disease, unspecified: Secondary | ICD-10-CM | POA: Insufficient documentation

## 2022-07-10 DIAGNOSIS — I129 Hypertensive chronic kidney disease with stage 1 through stage 4 chronic kidney disease, or unspecified chronic kidney disease: Secondary | ICD-10-CM | POA: Insufficient documentation

## 2022-07-10 DIAGNOSIS — E785 Hyperlipidemia, unspecified: Secondary | ICD-10-CM | POA: Insufficient documentation

## 2022-07-10 DIAGNOSIS — J90 Pleural effusion, not elsewhere classified: Secondary | ICD-10-CM | POA: Insufficient documentation

## 2022-07-10 DIAGNOSIS — Z8673 Personal history of transient ischemic attack (TIA), and cerebral infarction without residual deficits: Secondary | ICD-10-CM | POA: Insufficient documentation

## 2022-07-10 DIAGNOSIS — E538 Deficiency of other specified B group vitamins: Secondary | ICD-10-CM | POA: Insufficient documentation

## 2022-07-10 DIAGNOSIS — I6782 Cerebral ischemia: Secondary | ICD-10-CM | POA: Insufficient documentation

## 2022-07-10 DIAGNOSIS — J431 Panlobular emphysema: Secondary | ICD-10-CM | POA: Insufficient documentation

## 2022-07-10 DIAGNOSIS — Z8616 Personal history of COVID-19: Secondary | ICD-10-CM | POA: Insufficient documentation

## 2022-07-11 DIAGNOSIS — C3432 Malignant neoplasm of lower lobe, left bronchus or lung: Secondary | ICD-10-CM | POA: Diagnosis not present

## 2022-07-14 DIAGNOSIS — C3490 Malignant neoplasm of unspecified part of unspecified bronchus or lung: Secondary | ICD-10-CM | POA: Insufficient documentation

## 2022-07-14 NOTE — Progress Notes (Unsigned)
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  INTERVAL HISTORY: Patient is an 81 year old male who was noted to have an abnormality on chest x-ray after complaining of increased dyspnea on exertion.  Subsequent imaging with PET scan and biopsy revealed the above-stated malignancy.  He currently feels well.  He has no neurologic complaints.  He denies any recent fevers or illnesses.  He has a good appetite and denies weight loss.  He has no chest pain, shortness of breath, cough, or hemoptysis.  He denies any nausea, vomiting, constipation, or diarrhea.  He has no urinary complaints.  Patient otherwise feels well and offers no further specific complaints today.  REVIEW OF SYSTEMS:   Review of Systems  Constitutional: Negative.  Negative for fever, malaise/fatigue and weight loss.  Respiratory: Negative.  Negative for cough, hemoptysis and shortness of breath.   Cardiovascular: Negative.  Negative for chest pain and leg swelling.  Gastrointestinal: Negative.  Negative for abdominal pain.  Genitourinary: Negative.  Negative for frequency.  Musculoskeletal: Negative.  Negative for back pain.  Skin: Negative.  Negative for rash.  Neurological: Negative.  Negative for dizziness, focal weakness, weakness and headaches.  Psychiatric/Behavioral: Negative.  The patient is not nervous/anxious.     As per HPI. Otherwise, a complete review of systems is negative.  PAST MEDICAL HISTORY: Past Medical History:  Diagnosis Date   Acute deep vein thrombosis (DVT) of tibial vein of left lower extremity (HCC)    Acute renal failure superimposed on stage 3 chronic kidney disease (HCC)    Arthritis     ASCVD (arteriosclerotic cardiovascular disease)    Bronchitis    Cancer (HCC)    SKIN CANCERS   Carotid stenosis    bilateral-100%blockage on right carotid   COPD (chronic obstructive pulmonary disease) ([place])    Panlobular emphysema   Coronary artery disease    COVID-19 2020   Diverticulitis    Dyspnea    with exertion   Elevated d-dimer    Elevated troponin    Hemorrhoids    Hyperlipidemia    Hypertension    PAD (peripheral artery disease) ([place])    Pneumonia 2016   PSVT (paroxysmal supraventricular tachycardia) (HCC)    S/P CABG x 3 2019   Sepsis (HCC)    Stable angina (HCC)    Tachycardia    Umbilical hernia    Upper GI bleed    Vitamin B12 deficiency     PAST SURGICAL HISTORY: Past Surgical History:  Procedure Laterality Date   COLONOSCOPY  2015   CORONARY ARTERY BYPASS GRAFT N/A 01/14/2019   Procedure: Emergency Coronary Artery Bypass Grafting (CABG)  times three using left internal mammary artery to LAD, reversed saphenous vein graft to OM and RCA;  Surgeon: Grace Isaac, MD;  Location: [facility];  Service: Open Heart Surgery;  Laterality: N/A;   ENDOVEIN HARVEST OF GREATER SAPHENOUS VEIN Right 01/14/2019   Procedure: ENDOVEIN HARVEST OF GREATER SAPHENOUS VEIN;  Surgeon: Grace Isaac, MD;  Location: [place];  Service: Open Heart Surgery;  Laterality: Right;   EYE SURGERY     CATARACTS BIL   LEFT HEART CATH AND CORONARY ANGIOGRAPHY Left 01/14/2019   Procedure: LEFT HEART CATH AND CORONARY ANGIOGRAPHY;  Surgeon: Corey Skains, MD;  Location: [facility];  Service: Cardiovascular;  Laterality: Left;   TEE WITHOUT CARDIOVERSION N/A 01/14/2019   Procedure: TRANSESOPHAGEAL ECHOCARDIOGRAM (TEE);  Surgeon: Grace Isaac, MD;  Location: [place];  Service: Open Heart Surgery;  Laterality: N/A;   VIDEO BRONCHOSCOPY WITH ENDOBRONCHIAL NAVIGATION N/A 06/27/2022   Procedure: VIDEO BRONCHOSCOPY WITH ENDOBRONCHIAL NAVIGATION;  Surgeon: Ottie Glazier, MD;  Location:  [facility];  Service: Thoracic;  Laterality: N/A;   VIDEO BRONCHOSCOPY WITH ENDOBRONCHIAL ULTRASOUND N/A 06/27/2022   Procedure: VIDEO BRONCHOSCOPY WITH ENDOBRONCHIAL ULTRASOUND;  Surgeon: Ottie Glazier, MD;  Location: [facility];  Service: Thoracic;  Laterality: N/A;    FAMILY HISTORY: Family History  Problem Relation Age of Onset   Cancer Mother    Cancer Sister        Metastatic Lung Cancer    ADVANCED DIRECTIVES (Y/N):  N  HEALTH MAINTENANCE: Social History   Tobacco Use   Smoking status: Former    Packs/day: 1.00    Years: 50.00    Total pack years: 50.00    Types: Cigarettes    Quit date: 01/06/2015    Years since quitting: 7.5   Smokeless tobacco: Never  Vaping Use   Vaping Use: Never used  Substance Use Topics   Alcohol use: Yes    Comment: OCC    Drug use: Never     Colonoscopy:  PAP:  Bone density:  Lipid panel:  Allergies  Allergen Reactions   Prednisone Itching   Sulfa Antibiotics Other (See Comments)    Unknown    Current Outpatient Medications  Medication Sig Dispense Refill   Cyanocobalamin (VITAMIN B-12) 5000 MCG SUBL Place 1 tablet under the tongue once a week. Saturdays     Fluticasone-Umeclidin-Vilant (TRELEGY ELLIPTA) 100-62.5-25 MCG/ACT AEPB Inhale 1 puff into the lungs daily before lunch.     metoprolol tartrate (LOPRESSOR) 50 MG tablet Take 50 mg by mouth as needed (Tachycardia).     rosuvastatin (CRESTOR) 10 MG tablet Take 10 mg by mouth every morning.     No current facility-administered medications for this visit.    OBJECTIVE: Vitals:   07/15/22 1455  BP: 131/67  Pulse: 67  Resp: 16  Temp: 97.8 F (36.6 C)  SpO2: 98%     Body mass index is 24.37 kg/m.    ECOG FS:0 - Asymptomatic  General: Well-developed, well-nourished, no acute distress. Eyes: Pink conjunctiva, anicteric sclera. HEENT: Normocephalic, moist mucous membranes. Lungs: No audible wheezing or coughing. Heart: Regular rate and rhythm. Abdomen: Soft, nontender,  no obvious distention. Musculoskeletal: No edema, cyanosis, or clubbing. Neuro: Alert, answering all questions appropriately. Cranial nerves grossly intact. Skin: No rashes or petechiae noted. Psych: Normal affect. Lymphatics: No cervical, calvicular, axillary or inguinal LAD.   LAB RESULTS:  Lab Results  Component Value Date   NA 136 02/03/2020   K 4.4 02/03/2020   CL 106 02/03/2020   CO2 22 02/03/2020   GLUCOSE 84 02/03/2020   BUN 37 (H) 02/03/2020   CREATININE 1.15 02/03/2020   CALCIUM 8.3 (L) 02/03/2020   PROT 5.9 (L) 02/03/2020   ALBUMIN 3.4 (L) 02/03/2020   AST 23 02/03/2020   ALT 31 02/03/2020   ALKPHOS 68 02/03/2020   BILITOT 1.1 02/03/2020   GFRNONAA >60 02/03/2020   GFRAA >60 02/03/2020    Lab Results  Component Value Date   WBC 8.5 02/03/2020   NEUTROABS 6.7 02/03/2020   HGB 15.4 02/03/2020   HCT 46.7 02/03/2020  MCV 93.8 02/03/2020   PLT 168 02/03/2020     STUDIES: MR Brain W Wo Contrast  Result Date: 07/10/2022 CLINICAL DATA:  Malignant neoplasm of unspecified part of unspecified bronchus or lung (HCC) C34.90 (ICD-10-CM). EXAM: MRI HEAD WITHOUT AND WITH CONTRAST TECHNIQUE: Multiplanar, multiecho pulse sequences of the brain and surrounding structures were obtained without and with intravenous contrast. CONTRAST:  47mL GADAVIST GADOBUTROL 1 MMOL/ML IV SOLN COMPARISON:  Head CT January 31, 2020. FINDINGS: Brain: No acute infarction, hemorrhage, hydrocephalus, extra-axial collection or mass lesion. Remote infarct in the right corona radiata/centrum semiovale extending into the right frontal cortex with susceptibility artifact suggesting hemosiderin deposit. Small remote infarcts in the bilateral caudate head and right cerebellar hemisphere. Scattered and confluent foci of T2 hyperintensity are seen within the white matter of the cerebral hemispheres, nonspecific, most likely related to chronic small vessel ischemia. No focus of abnormal contrast enhancement  identified. Vascular: Absent flow void in the extradural right internal carotid artery suggesting occlusion. Skull and upper cervical spine: No focal lesion identified. Sinuses/Orbits: Negative. Other: None. IMPRESSION: 1. No evidence of intracranial metastatic disease. 2. 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 3. Mild chronic microvascular ischemic changes of the white matter. 4. 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 Electronically Signed   By: Pedro Earls M.D.   On: 07/10/2022 13:05   NM PET Image Initial (PI) Skull Base To Thigh  Result Date: 07/09/2022 CLINICAL DATA:  Initial treatment strategy for non-small-cell lung cancer. EXAM: NUCLEAR MEDICINE PET SKULL BASE TO THIGH TECHNIQUE: A 0.5 mCi F-18 FDG was injected intravenously. Full-ring PET imaging was performed from the skull base to thigh after the radiotracer. CT data was obtained and used for attenuation correction and anatomic localization. Fasting blood glucose: 90 mg/dl COMPARISON:  Chest CT 06/12/2022 FINDINGS: Mediastinal blood pool activity: SUV max 1.8 Liver activity: SUV max NA NECK: No hypermetabolic lymph nodes in the neck. Incidental CT findings: none CHEST: 4.1 cm cavitary lesion posterior left lower lobe is markedly hypermetabolic with SUV max = 89.2. 11 mm short axis left hilar lymph node just cranial to the left inferior pulmonary vein on image 101/2 shows low level hypermetabolism with SUV max = 3.2. No other hypermetabolic lymphadenopathy in the hilar regions or mediastinum. No other unexpected soft tissue hypermetabolism in the thorax. Incidental CT findings: Heart size normal. Mitral annular calcification noted. Coronary artery calcification is evident. Status post CABG. Centrilobular emphsyema noted. Tiny left pleural effusion. ABDOMEN/PELVIS: No abnormal hypermetabolic activity within the liver, pancreas, adrenal glands, or spleen. No  hypermetabolic lymph nodes in the abdomen or pelvis. Incidental CT findings: 14 mm cyst noted lower pole right kidney with additional small cysts in the interpolar region and upper pole of the right kidney. Left kidney unremarkable. There is moderate atherosclerotic calcification of the abdominal aorta without aneurysm. Left colonic diverticulosis without diverticulitis. SKELETON: No focal hypermetabolic activity to suggest skeletal metastasis. Incidental CT findings: none IMPRESSION: 1. 4.1 cm left lower lobe pulmonary mass is markedly hypermetabolic, consistent with patient's history of known primary neoplasm. 2. The low left hilar node identified on previous CT shows low level hypermetabolism on PET imaging today, suspicious for metastatic involvement. 3. No evidence for distant metastatic disease in the neck, abdomen, or pelvis. 4. Left colonic diverticulosis. 5.  Aortic Atherosclerois (ICD10-170.0) 6.  Emphysema. ([id number]) Electronically Signed   By: Misty Stanley M.D.   On: 07/09/2022 05:12   DG Chest Port 1  View  Result Date: 06/27/2022 CLINICAL DATA:  Postprocedural for left lung bronchoscopy EXAM: PORTABLE CHEST 1 VIEW COMPARISON:  Multiple exams, including 06/12/2022 chest CT FINDINGS: Indistinct stranding and faint airspace opacity around the known cavitary left lower lobe mass, possibly reflecting post biopsy blood products in the region. Emphysema. Atherosclerotic calcification of the aortic arch. Prior CABG. No pneumothorax. IMPRESSION: 1. No pneumothorax observed. 2. Indistinct stranding and density around the left lower lobe mass, possibly reflecting some post biopsy blood products in the region. 3. Aortic Atherosclerosis (ICD10-I70.0) and Emphysema (ICD10-J43.9). Electronically Signed   By: Van Clines M.D.   On: 06/27/2022 10:47   DG C-Arm 1-60 Min-No Report  Result Date: 06/27/2022 Fluoroscopy was utilized by the requesting physician.  No radiographic interpretation.     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  PET scan results from July 09, 2022 reviewed independently and reported as above with a 4.1 cm left lower lobe lesion as well as a mildly hypermetabolic left hilar node suspicious for metastatic disease.  MRI of the brain on July 10, 2022 did not reveal any metastatic disease.  After lengthy discussion with the patient, he is interested in surgical resection if possible and PFTs have been completed.  A referral was sent to thoracic surgery for further evaluation.  If patient is not a surgical candidate, he likely will benefit from concurrent chemotherapy and XRT.  He appears to only be a stage II so maintenance immunotherapy may not be necessary.  Follow-up will be based on thoracic surgery recommendations.  I spent a total of 60 minutes reviewing chart data, face-to-face evaluation with the patient, counseling and coordination of care as detailed above.   Patient expressed understanding and was in agreement with this plan. He also understands that He can call clinic at any time with any questions, concerns, or complaints.    Cancer Staging  Squamous cell carcinoma lung ([place]) Staging form: Lung, AJCC 8th Edition - Clinical stage from 07/14/2022: Stage IIB (cT2, cN1, cM0) - Signed by Lloyd Huger, MD on 07/14/2022 Stage prefix: Initial diagnosis  Lloyd Huger, MD   07/16/2022 7:00 AM

## 2022-07-15 ENCOUNTER — Inpatient Hospital Stay: Payer: PPO

## 2022-07-15 ENCOUNTER — Encounter: Payer: Self-pay | Admitting: Oncology

## 2022-07-15 ENCOUNTER — Inpatient Hospital Stay (HOSPITAL_BASED_OUTPATIENT_CLINIC_OR_DEPARTMENT_OTHER): Payer: PPO | Admitting: Oncology

## 2022-07-15 DIAGNOSIS — I129 Hypertensive chronic kidney disease with stage 1 through stage 4 chronic kidney disease, or unspecified chronic kidney disease: Secondary | ICD-10-CM | POA: Diagnosis not present

## 2022-07-15 DIAGNOSIS — I739 Peripheral vascular disease, unspecified: Secondary | ICD-10-CM | POA: Diagnosis not present

## 2022-07-15 DIAGNOSIS — J90 Pleural effusion, not elsewhere classified: Secondary | ICD-10-CM | POA: Diagnosis not present

## 2022-07-15 DIAGNOSIS — K5730 Diverticulosis of large intestine without perforation or abscess without bleeding: Secondary | ICD-10-CM | POA: Diagnosis not present

## 2022-07-15 DIAGNOSIS — C3492 Malignant neoplasm of unspecified part of left bronchus or lung: Secondary | ICD-10-CM | POA: Diagnosis not present

## 2022-07-15 DIAGNOSIS — Z86718 Personal history of other venous thrombosis and embolism: Secondary | ICD-10-CM | POA: Diagnosis not present

## 2022-07-15 DIAGNOSIS — N183 Chronic kidney disease, stage 3 unspecified: Secondary | ICD-10-CM | POA: Diagnosis not present

## 2022-07-15 DIAGNOSIS — I6782 Cerebral ischemia: Secondary | ICD-10-CM | POA: Diagnosis not present

## 2022-07-15 DIAGNOSIS — J431 Panlobular emphysema: Secondary | ICD-10-CM | POA: Diagnosis not present

## 2022-07-15 DIAGNOSIS — I2510 Atherosclerotic heart disease of native coronary artery without angina pectoris: Secondary | ICD-10-CM | POA: Diagnosis not present

## 2022-07-15 DIAGNOSIS — E538 Deficiency of other specified B group vitamins: Secondary | ICD-10-CM | POA: Diagnosis not present

## 2022-07-15 DIAGNOSIS — Z8673 Personal history of transient ischemic attack (TIA), and cerebral infarction without residual deficits: Secondary | ICD-10-CM | POA: Diagnosis not present

## 2022-07-15 DIAGNOSIS — Z79899 Other long term (current) drug therapy: Secondary | ICD-10-CM | POA: Diagnosis not present

## 2022-07-15 DIAGNOSIS — Z87891 Personal history of nicotine dependence: Secondary | ICD-10-CM | POA: Diagnosis not present

## 2022-07-15 DIAGNOSIS — E785 Hyperlipidemia, unspecified: Secondary | ICD-10-CM | POA: Diagnosis not present

## 2022-07-15 DIAGNOSIS — C3432 Malignant neoplasm of lower lobe, left bronchus or lung: Secondary | ICD-10-CM | POA: Diagnosis not present

## 2022-07-15 DIAGNOSIS — I700 Atherosclerosis of aorta: Secondary | ICD-10-CM | POA: Diagnosis not present

## 2022-07-15 DIAGNOSIS — Z8616 Personal history of COVID-19: Secondary | ICD-10-CM | POA: Diagnosis not present

## 2022-07-18 LAB — CULTURE, FUNGUS WITHOUT SMEAR: Culture: NO GROWTH

## 2022-07-21 ENCOUNTER — Encounter: Payer: Self-pay | Admitting: *Deleted

## 2022-07-21 NOTE — Progress Notes (Unsigned)
Spoke with patient regarding recent visit with Dr. Grayland Ormond. Reviewed upcoming appts for consult with Dr. Roxan Hockey on 8/7 at 3:15pm. Pt stated that he has not heard from their clinic regarding appt. Phone number given to patient and instructed to call by the end of the week if he hasnt received a call to confirm his appt. All questions answered during call. Informed pt that he has consult with Dr. Baruch Gouty scheduled on 8/8 at 11am but pt wishes to cancel at this time and wishes to reschedule if he is determined to NOT be a surgical candidate. Appt cancelled and will follow up with patient after his visit with Dr. Roxan Hockey.

## 2022-07-25 ENCOUNTER — Institutional Professional Consult (permissible substitution): Payer: PPO | Admitting: Radiation Oncology

## 2022-07-28 ENCOUNTER — Encounter: Payer: Self-pay | Admitting: Thoracic Surgery (Cardiothoracic Vascular Surgery)

## 2022-07-28 ENCOUNTER — Institutional Professional Consult (permissible substitution): Payer: PPO | Admitting: Thoracic Surgery (Cardiothoracic Vascular Surgery)

## 2022-07-28 ENCOUNTER — Other Ambulatory Visit: Payer: Self-pay | Admitting: *Deleted

## 2022-07-28 VITALS — BP 139/67 | HR 67 | Resp 20 | Ht 66.0 in | Wt 150.1 lb

## 2022-07-28 DIAGNOSIS — C3492 Malignant neoplasm of unspecified part of left bronchus or lung: Secondary | ICD-10-CM

## 2022-07-28 DIAGNOSIS — Z01818 Encounter for other preprocedural examination: Secondary | ICD-10-CM

## 2022-07-28 NOTE — Progress Notes (Signed)
PCP is Rusty Aus, MD Referring Provider is Lloyd Huger, MD  Chief Complaint  Patient presents with   Lung Cancer    New patient consultation, chest CT 6/22, PFTs 6/28, Bronch/bx 7/7, PET 7/18, MR Brain 7/19    HPI: [name] is sent for consultation regarding a left lower lobe squamous cell carcinoma.  Anthony Morrison is an 81 year old man with a past medical history significant for tobacco abuse, COPD, CAD, CABG x 3 in 2016 ([facility]), hypertension, hyperlipidemia, extracranial carotid disease, COVID-19, tibial vein DVT, stage III chronic kidney disease, pneumonia, paroxysmal SVT, and umbilical hernia.  He smoked a pack a day for about 50 years prior to quitting in 2016.  He had coronary bypass grafting x 3 by Dr. Servando Snare in 2016.  He was being evaluated for shortness of breath with exertion.  He would get winded with walking about one half block on level ground.  Complained of a sensation of tightness in Anthony chest but no pressure or chest pain.  Anthony workup included chest x-ray which showed a left lower lobe cavitary lung nodule.  That led to a CT which confirmed a 4.3 cm cavitary left lower lobe lung nodule with a borderline hilar node.  On PET/CT the mass was markedly hypermetabolic but the node was equivocal.  He underwent bronchoscopy and endobronchial ultrasound by Dr. Lanney Gins.  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  Aspirations from lymph node showed lymphoid cells and a few atypical cells but no evidence of malignancy.  He saw Dr. Grayland Ormond and was referred for consideration for surgical resection.  He still gets short of breath with exertion.  He gives a vague history and is not sure if he had similar symptoms prior to Anthony CABG.  However, Anthony Morrison who is with him says that he did have similar symptoms before Anthony bypass surgery.  He has not had any change in appetite but has lost about 5 pounds.  He does complain of some decreased energy.  He has coughed up some  blood as well but not recently.  Zubrod Score: At the time of surgery this patient's most appropriate activity status/level should be described as: []     0    Normal activity, no symptoms [x]     1    Restricted in physical strenuous activity but ambulatory, able to do out light work []     2    Ambulatory and capable of self care, unable to do work activities, up and about >50 % of waking hours                              []     3    Only limited self care, in bed greater than 50% of waking hours []     4    Completely disabled, no self care, confined to bed or chair []     5    Moribund    Past Medical History:  Diagnosis Date   Acute deep vein thrombosis (DVT) of tibial vein of left lower extremity (HCC)    Acute renal failure superimposed on stage 3 chronic kidney disease (HCC)    Arthritis    ASCVD (arteriosclerotic cardiovascular disease)    Bronchitis    Cancer ([place])    SKIN CANCERS   Carotid stenosis    bilateral-100%blockage on right carotid   COPD (chronic obstructive pulmonary disease) ([place])    Panlobular emphysema  Coronary artery disease    COVID-19 2020   Diverticulitis    Dyspnea    with exertion   Elevated d-dimer    Elevated troponin    Hemorrhoids    Hyperlipidemia    Hypertension    PAD (peripheral artery disease) (HCC)    Pneumonia 2016   PSVT (paroxysmal supraventricular tachycardia) (HCC)    S/P CABG x 3 2019   Sepsis (HCC)    Stable angina (HCC)    Tachycardia    Umbilical hernia    Upper GI bleed    Vitamin B12 deficiency     Past Surgical History:  Procedure Laterality Date   COLONOSCOPY  2015   CORONARY ARTERY BYPASS GRAFT N/A 01/14/2019   Procedure: Emergency Coronary Artery Bypass Grafting (CABG)  times three using left internal mammary artery to LAD, reversed saphenous vein graft to OM and RCA;  Surgeon: Grace Isaac, MD;  Location: [facility];  Service: Open Heart Surgery;  Laterality: N/A;   ENDOVEIN HARVEST OF GREATER SAPHENOUS VEIN  Right 01/14/2019   Procedure: ENDOVEIN HARVEST OF GREATER SAPHENOUS VEIN;  Surgeon: Grace Isaac, MD;  Location: [facility];  Service: Open Heart Surgery;  Laterality: Right;   EYE SURGERY     CATARACTS BIL   LEFT HEART CATH AND CORONARY ANGIOGRAPHY Left 01/14/2019   Procedure: LEFT HEART CATH AND CORONARY ANGIOGRAPHY;  Surgeon: Corey Skains, MD;  Location: [facility];  Service: Cardiovascular;  Laterality: Left;   TEE WITHOUT CARDIOVERSION N/A 01/14/2019   Procedure: TRANSESOPHAGEAL ECHOCARDIOGRAM (TEE);  Surgeon: Grace Isaac, MD;  Location: [facility];  Service: Open Heart Surgery;  Laterality: N/A;   VIDEO BRONCHOSCOPY WITH ENDOBRONCHIAL NAVIGATION N/A 06/27/2022   Procedure: VIDEO BRONCHOSCOPY WITH ENDOBRONCHIAL NAVIGATION;  Surgeon: Ottie Glazier, MD;  Location: [facility];  Service: Thoracic;  Laterality: N/A;   VIDEO BRONCHOSCOPY WITH ENDOBRONCHIAL ULTRASOUND N/A 06/27/2022   Procedure: VIDEO BRONCHOSCOPY WITH ENDOBRONCHIAL ULTRASOUND;  Surgeon: Ottie Glazier, MD;  Location: [facility];  Service: Thoracic;  Laterality: N/A;    Family History  Problem Relation Age of Onset   Cancer Mother    Cancer Sister        Metastatic Lung Cancer    Social History Social History   Tobacco Use   Smoking status: Former    Packs/day: 1.00    Years: 50.00    Total pack years: 50.00    Types: Cigarettes    Quit date: 01/06/2015    Years since quitting: 7.5   Smokeless tobacco: Never  Vaping Use   Vaping Use: Never used  Substance Use Topics   Alcohol use: Yes    Comment: OCC    Drug use: Never    Current Outpatient Medications  Medication Sig Dispense Refill   Cyanocobalamin (VITAMIN B-12) 5000 MCG SUBL Place 1 tablet under the tongue once a week. Saturdays     Fluticasone-Umeclidin-Vilant (TRELEGY ELLIPTA) 100-62.5-25 MCG/ACT AEPB Inhale 1 puff into the lungs daily before lunch.     metoprolol tartrate (LOPRESSOR) 50 MG tablet Take 50 mg by mouth as needed (Tachycardia).      rosuvastatin (CRESTOR) 10 MG tablet Take 10 mg by mouth every morning.     No current facility-administered medications for this visit.    Allergies  Allergen Reactions   Prednisone Itching   Sulfa Antibiotics Other (See Comments)    Unknown    Review of Systems  Constitutional:  Positive for fatigue.  HENT:  Positive for dental problem (Dentures).  Negative for trouble swallowing and voice change.   Respiratory:  Positive for cough, chest tightness and shortness of breath. Negative for wheezing.   Cardiovascular:  Positive for leg swelling. Negative for chest pain.  Gastrointestinal:  Positive for constipation.  Genitourinary:  Positive for frequency.  Neurological:  Positive for numbness. Negative for seizures, syncope and weakness.  Hematological:  Negative for adenopathy. Bruises/bleeds easily.  Psychiatric/Behavioral:  The patient is nervous/anxious.     BP 139/67 (BP Location: Left Arm, Patient Position: Sitting, Cuff Size: Normal)   Pulse 67   Resp 20   Ht 5\' 6"  (1.676 m)   Wt 150 lb 1.6 oz (68.1 kg)   SpO2 96% Comment: RA  BMI 24.23 kg/m  Physical Exam Vitals reviewed.  Constitutional:      General: He is not in acute distress.    Appearance: Normal appearance.  HENT:     Head: Normocephalic and atraumatic.  Eyes:     General: No scleral icterus.    Extraocular Movements: Extraocular movements intact.  Neck:     Vascular: Carotid bruit present.  Cardiovascular:     Rate and Rhythm: Normal rate and regular rhythm.     Heart sounds: Normal heart sounds. No murmur heard. Pulmonary:     Effort: Pulmonary effort is normal. No respiratory distress.     Breath sounds: Normal breath sounds. No wheezing or rales.  Abdominal:     General: There is no distension.     Palpations: Abdomen is soft.  Lymphadenopathy:     Cervical: No cervical adenopathy.  Skin:    General: Skin is warm and dry.  Neurological:     General: No focal deficit present.     Mental  Status: He is alert and oriented to person, place, and time.     Cranial Nerves: No cranial nerve deficit.     Motor: No weakness.    Diagnostic Tests: NUCLEAR MEDICINE PET SKULL BASE TO THIGH   TECHNIQUE: A 0.5 mCi F-18 FDG was injected intravenously. Full-ring PET imaging was performed from the skull base to thigh after the radiotracer. CT data was obtained and used for attenuation correction and anatomic localization.   Fasting blood glucose: 90 mg/dl   COMPARISON:  Chest CT 06/12/2022   FINDINGS: Mediastinal blood pool activity: SUV max 1.8   Liver activity: SUV max NA   NECK: No hypermetabolic lymph nodes in the neck.   Incidental CT findings: none   CHEST: 4.1 cm cavitary lesion posterior left lower lobe is markedly hypermetabolic with SUV max = 54.6.   11 mm short axis left hilar lymph node just cranial to the left inferior pulmonary vein on image 101/2 shows low level hypermetabolism with SUV max = 3.2.   No other hypermetabolic lymphadenopathy in the hilar regions or mediastinum. No other unexpected soft tissue hypermetabolism in the thorax.   Incidental CT findings: Heart size normal. Mitral annular calcification noted. Coronary artery calcification is evident. Status post CABG. Centrilobular emphsyema noted. Tiny left pleural effusion.   ABDOMEN/PELVIS: No abnormal hypermetabolic activity within the liver, pancreas, adrenal glands, or spleen. No hypermetabolic lymph nodes in the abdomen or pelvis.   Incidental CT findings: 14 mm cyst noted lower pole right kidney with additional small cysts in the interpolar region and upper pole of the right kidney. Left kidney unremarkable. There is moderate atherosclerotic calcification of the abdominal aorta without aneurysm. Left colonic diverticulosis without diverticulitis.   SKELETON: No focal hypermetabolic activity to suggest skeletal metastasis.  Incidental CT findings: none   IMPRESSION: 1. 4.1 cm  left lower lobe pulmonary mass is markedly hypermetabolic, consistent with patient's history of known primary neoplasm. 2. The low left hilar node identified on previous CT shows low level hypermetabolism on PET imaging today, suspicious for metastatic involvement. 3. No evidence for distant metastatic disease in the neck, abdomen, or pelvis. 4. Left colonic diverticulosis. 5.  Aortic Atherosclerois (ICD10-170.0) 6.  Emphysema. (SFK81-E75.9)     Electronically Signed   By: Misty Stanley M.D.   On: 07/09/2022 05:12 I personally reviewed the CT and PET/CT images.  There is a 4.1 cm cavitary left lower lobe lung mass highly suspicious for a primary bronchogenic carcinoma.  Some activity in the left hilar node as well although lower grade.  Some emphysema changes and also aortic atherosclerosis.  No evidence of regional or distant metastatic disease.  Pulmonary function testing 05/2022 FVC 2.41 (72%) FEV1 1.57 (69%)   Impression: Anthony Morrison is an 81 year old man with a past medical history significant for tobacco abuse, COPD, CAD, CABG x 3 in 2016 ([facility]), hypertension, hyperlipidemia, extracranial carotid disease, COVID-19, tibial vein DVT, stage III chronic kidney disease, pneumonia, paroxysmal SVT, and umbilical hernia.  He smoked a pack a day for about 50 years prior to quitting in 2016.   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  Clinical stage T2bN0, stage IIa versus T2bN1, stage IIb.   COPD-FVC and FEV1 both about 70% of predicted.  Dyspnea is out of proportion to that degree of COPD.  Will need full PFTs with and without bronchodilators and with diffusion capacity to further assess.  CAD-given Anthony history of CAD without any real convincing anginal symptoms prior to that, I am very concerned that some of Anthony dyspnea could be cardiac in nature.  Anthony Morrison said he had similar complaints prior to having Anthony bypass surgery.  Those  then resolved for several years but then he started having them again.  I think he should be seen by cardiology for clearance prior to surgery.  History of DVT-in the setting of prolonged hospitalization with COVID-19.  Not currently on any blood thinners.  I discussed in detail with Anthony Morrison and Anthony Morrison the issues regarding treatment of Anthony lung cancer.  Options include chemoradiation versus surgical resection and likely adjuvant chemotherapy depending on the final pathologic staging.  We discussed issues regarding the left hilar node.  Normally that would be easily accessible with surgery however in the postoperative setting after coronary bypass grafting or lymph node dissection could be more limited than normal.  Will all depend on the degree of adhesions and scarring.  I offered him the option of surgical resection (pending cardiology clearance).  I discussed the general nature of the surgery with Anthony Morrison and Anthony Morrison.  They are aware of the need for general anesthesia, the incisions to be used, the use of the surgical robot, the use of a drainage tube postoperatively, the expected hospital stay, and the overall recovery.  I informed him of the indications, risks, benefits, and alternatives.  They understand the risks include, but are not limited to death, MI, DVT, PE, bleeding, possible need for transfusion, infection, cardiac arrhythmias, prolonged air leaks, as well as the possibility of other unforeseeable complications.  He is at higher risk than normal for MI and DVT and PE due to Anthony history.  Plan: He needs cardiology clearance prior to surgery Full PFTs with and without bronchodilators  and with diffusion capacity Will schedule for robotic left lower lobectomy once cleared by cardiology.  Melrose Nakayama, MD [facility] [phone]

## 2022-07-29 ENCOUNTER — Institutional Professional Consult (permissible substitution): Payer: PPO | Admitting: Radiation Oncology

## 2022-07-31 ENCOUNTER — Ambulatory Visit: Payer: PPO | Attending: Thoracic Surgery (Cardiothoracic Vascular Surgery)

## 2022-07-31 DIAGNOSIS — C3492 Malignant neoplasm of unspecified part of left bronchus or lung: Secondary | ICD-10-CM | POA: Insufficient documentation

## 2022-07-31 DIAGNOSIS — Z01818 Encounter for other preprocedural examination: Secondary | ICD-10-CM | POA: Diagnosis not present

## 2022-07-31 LAB — PULMONARY FUNCTION TEST ARMC ONLY
DL/VA % pred: 51 %
DL/VA: 2.01 ml/min/mmHg/L
DLCO unc % pred: 50 %
DLCO unc: 10.7 ml/min/mmHg
FEF 25-75 Post: 1.15 L/sec
FEF 25-75 Pre: 1 L/sec
FEF2575-%Change-Post: 15 %
FEF2575-%Pred-Post: 73 %
FEF2575-%Pred-Pre: 63 %
FEV1-%Change-Post: 3 %
FEV1-%Pred-Post: 88 %
FEV1-%Pred-Pre: 85 %
FEV1-Post: 2.07 L
FEV1-Pre: 2 L
FEV1FVC-%Change-Post: 2 %
FEV1FVC-%Pred-Pre: 87 %
FEV6-%Change-Post: 4 %
FEV6-%Pred-Post: 104 %
FEV6-%Pred-Pre: 100 %
FEV6-Post: 3.22 L
FEV6-Pre: 3.09 L
FEV6FVC-%Change-Post: 3 %
FEV6FVC-%Pred-Post: 108 %
FEV6FVC-%Pred-Pre: 105 %
FVC-%Change-Post: 1 %
FVC-%Pred-Post: 96 %
FVC-%Pred-Pre: 95 %
FVC-Post: 3.22 L
FVC-Pre: 3.18 L
Post FEV1/FVC ratio: 64 %
Post FEV6/FVC ratio: 100 %
Pre FEV1/FVC ratio: 63 %
Pre FEV6/FVC Ratio: 97 %
RV % pred: 70 %
RV: 1.74 L
TLC % pred: 81 %
TLC: 5.09 L

## 2022-07-31 MED ORDER — ALBUTEROL SULFATE (2.5 MG/3ML) 0.083% IN NEBU
2.5000 mg | INHALATION_SOLUTION | Freq: Once | RESPIRATORY_TRACT | Status: AC
  Administered 2022-07-31: 2.5 mg via RESPIRATORY_TRACT
  Filled 2022-07-31: qty 3

## 2022-08-06 ENCOUNTER — Inpatient Hospital Stay: Payer: PPO | Attending: Radiation Oncology | Admitting: Hospice and Palliative Medicine

## 2022-08-06 DIAGNOSIS — C3492 Malignant neoplasm of unspecified part of left bronchus or lung: Secondary | ICD-10-CM

## 2022-08-06 NOTE — Progress Notes (Signed)
Multidisciplinary Oncology Council Documentation  Anthony Morrison was presented by our [facility] on 08/06/2022, which included representatives from:  Palliative Care Dietitian  Physical/Occupational Therapist Nurse Navigator Genetics Speech Therapist Social work Survivorship RN Financial Navigator Research RN   Anthony Morrison currently presents with history of lung cancer  We reviewed previous medical and familial history, history of present illness, and recent lab results along with all available histopathologic and imaging studies. The [place] considered available treatment options and made the following recommendations/referrals:  None currently  The MOC is a meeting of clinicians from various specialty areas who evaluate and discuss patients for whom a multidisciplinary approach is being considered. Final determinations in the plan of care are those of the provider(s).   Today's extended care, comprehensive team conference, Anthony Morrison was not present for the discussion and was not examined.

## 2022-08-13 ENCOUNTER — Telehealth: Payer: Self-pay | Admitting: *Deleted

## 2022-08-13 DIAGNOSIS — E782 Mixed hyperlipidemia: Secondary | ICD-10-CM | POA: Diagnosis not present

## 2022-08-13 DIAGNOSIS — R9431 Abnormal electrocardiogram [ECG] [EKG]: Secondary | ICD-10-CM | POA: Diagnosis not present

## 2022-08-13 DIAGNOSIS — I10 Essential (primary) hypertension: Secondary | ICD-10-CM | POA: Diagnosis not present

## 2022-08-13 DIAGNOSIS — I2510 Atherosclerotic heart disease of native coronary artery without angina pectoris: Secondary | ICD-10-CM | POA: Diagnosis not present

## 2022-08-13 NOTE — Telephone Encounter (Signed)
Received call from pt to give update after seeing the cardiologist today. Pt stated that he needs to have a stress test prior to getting surgical clearance. Stress test scheduled for 9/12. Informed pt that will continue to follow and get him scheduled for further follow up after surgery once scheduled or sooner if deemed not a surgical candidate. Instructed pt to call with any questions. Pt verbalized understanding.

## 2022-08-17 LAB — ACID FAST CULTURE WITH REFLEXED SENSITIVITIES (MYCOBACTERIA): Acid Fast Culture: NEGATIVE

## 2022-08-30 ENCOUNTER — Emergency Department: Payer: PPO

## 2022-08-30 ENCOUNTER — Other Ambulatory Visit: Payer: Self-pay

## 2022-08-30 ENCOUNTER — Emergency Department
Admission: EM | Admit: 2022-08-30 | Discharge: 2022-08-31 | Disposition: A | Discharge status: Home / Self Care | Payer: PPO | Attending: Emergency Medicine | Admitting: Emergency Medicine

## 2022-08-30 DIAGNOSIS — I2510 Atherosclerotic heart disease of native coronary artery without angina pectoris: Secondary | ICD-10-CM | POA: Diagnosis not present

## 2022-08-30 DIAGNOSIS — N182 Chronic kidney disease, stage 2 (mild): Secondary | ICD-10-CM | POA: Insufficient documentation

## 2022-08-30 DIAGNOSIS — R824 Acetonuria: Secondary | ICD-10-CM | POA: Diagnosis not present

## 2022-08-30 DIAGNOSIS — I672 Cerebral atherosclerosis: Secondary | ICD-10-CM | POA: Diagnosis not present

## 2022-08-30 DIAGNOSIS — R42 Dizziness and giddiness: Secondary | ICD-10-CM | POA: Insufficient documentation

## 2022-08-30 DIAGNOSIS — Z951 Presence of aortocoronary bypass graft: Secondary | ICD-10-CM | POA: Diagnosis not present

## 2022-08-30 DIAGNOSIS — R61 Generalized hyperhidrosis: Secondary | ICD-10-CM | POA: Diagnosis not present

## 2022-08-30 DIAGNOSIS — R55 Syncope and collapse: Secondary | ICD-10-CM | POA: Diagnosis not present

## 2022-08-30 DIAGNOSIS — Z85118 Personal history of other malignant neoplasm of bronchus and lung: Secondary | ICD-10-CM | POA: Insufficient documentation

## 2022-08-30 DIAGNOSIS — R0902 Hypoxemia: Secondary | ICD-10-CM | POA: Diagnosis not present

## 2022-08-30 DIAGNOSIS — J449 Chronic obstructive pulmonary disease, unspecified: Secondary | ICD-10-CM | POA: Diagnosis not present

## 2022-08-30 LAB — URINALYSIS, ROUTINE W REFLEX MICROSCOPIC
Bacteria, UA: NONE SEEN
Bilirubin Urine: NEGATIVE
Glucose, UA: NEGATIVE mg/dL
Hgb urine dipstick: NEGATIVE
Ketones, ur: 5 mg/dL — AB
Leukocytes,Ua: NEGATIVE
Nitrite: NEGATIVE
Protein, ur: 30 mg/dL — AB
Specific Gravity, Urine: 1.021 (ref 1.005–1.030)
pH: 5 (ref 5.0–8.0)

## 2022-08-30 LAB — CBC WITH DIFFERENTIAL/PLATELET
Abs Immature Granulocytes: 0.04 10*3/uL (ref 0.00–0.07)
Basophils Absolute: 0 10*3/uL (ref 0.0–0.1)
Basophils Relative: 0 %
Eosinophils Absolute: 0.2 10*3/uL (ref 0.0–0.5)
Eosinophils Relative: 2 %
HCT: 39.3 % (ref 39.0–52.0)
Hemoglobin: 12.6 g/dL — ABNORMAL LOW (ref 13.0–17.0)
Immature Granulocytes: 0 %
Lymphocytes Relative: 28 %
Lymphs Abs: 2.7 10*3/uL (ref 0.7–4.0)
MCH: 28.6 pg (ref 26.0–34.0)
MCHC: 32.1 g/dL (ref 30.0–36.0)
MCV: 89.3 fL (ref 80.0–100.0)
Monocytes Absolute: 0.7 10*3/uL (ref 0.1–1.0)
Monocytes Relative: 7 %
Neutro Abs: 6.1 10*3/uL (ref 1.7–7.7)
Neutrophils Relative %: 63 %
Platelets: 279 10*3/uL (ref 150–400)
RBC: 4.4 MIL/uL (ref 4.22–5.81)
RDW: 13 % (ref 11.5–15.5)
WBC: 9.7 10*3/uL (ref 4.0–10.5)
nRBC: 0 % (ref 0.0–0.2)

## 2022-08-30 LAB — COMPREHENSIVE METABOLIC PANEL
ALT: 7 U/L (ref 0–44)
AST: 13 U/L — ABNORMAL LOW (ref 15–41)
Albumin: 3.5 g/dL (ref 3.5–5.0)
Alkaline Phosphatase: 127 U/L — ABNORMAL HIGH (ref 38–126)
Anion gap: 9 (ref 5–15)
BUN: 16 mg/dL (ref 8–23)
CO2: 25 mmol/L (ref 22–32)
Calcium: 8.6 mg/dL — ABNORMAL LOW (ref 8.9–10.3)
Chloride: 106 mmol/L (ref 98–111)
Creatinine, Ser: 1.21 mg/dL (ref 0.61–1.24)
GFR, Estimated: >60 mL/min (ref >60)
Glucose, Bld: 106 mg/dL — ABNORMAL HIGH (ref 70–99)
Potassium: 3.4 mmol/L — ABNORMAL LOW (ref 3.5–5.1)
Sodium: 140 mmol/L (ref 135–145)
Total Bilirubin: 0.6 mg/dL (ref 0.3–1.2)
Total Protein: 7 g/dL (ref 6.5–8.1)

## 2022-08-30 LAB — TROPONIN I (HIGH SENSITIVITY): Troponin I (High Sensitivity): 6 ng/L (ref <18)

## 2022-08-30 MED ORDER — SODIUM CHLORIDE 0.9 % IV BOLUS
500.0000 mL | Freq: Once | INTRAVENOUS | Status: AC
  Administered 2022-08-30: 500 mL via INTRAVENOUS

## 2022-08-30 NOTE — ED Provider Notes (Signed)
[facility] Provider Note    Event Date/Time   First MD Initiated Contact with Patient 08/30/22 2149     (approximate)   History   Dizziness (Pt arrived via EMS from home for onset of n/v/dizziness/diaphoresis that occurred this evening during dinner. Pt denies chest pain, sob, or any other sx's. Recent dx of lung cancer x3 months ago, no tx started. )   HPI  Anthony Morrison is a 81 y.o. male with a history of stage II CKD, CAD status post CABG, COPD, DVT, and lung cancer presents with an episode of dizziness, acute onset shortly prior to coming to the ED and occurring after he ate and was watching TV.  The patient states that he suddenly got lightheaded and was sweating.  He then started to have a sensation of the room spinning and became nauseous.  He vomited once and had 1 episode of diarrhea.  He briefly felt like he might pass out.  Shortly thereafter the symptoms resolved.  He states that he now feels back to normal.  He states that earlier he was feeling well.  He denies any associated chest pain, difficulty breathing, abdominal pain, fever, or urinary symptoms.  He has no headache.  He denies any prior history of similar episodes.    Physical Exam   Triage Vital Signs: ED Triage Vitals  Enc Vitals Group     BP 08/30/22 2151 (!) 144/68     Pulse Rate 08/30/22 2151 62     Resp 08/30/22 2151 14     Temp 08/30/22 2151 97.7 F (36.5 C)     Temp Source 08/30/22 2151 Oral     SpO2 08/30/22 2151 99 %     Weight 08/30/22 2155 150 lb (68 kg)     Height 08/30/22 2155 5\' 6"  (1.676 m)     Head Circumference --      Peak Flow --      Pain Score 08/30/22 2155 0     Pain Loc --      Pain Edu? --      Excl. in [place]? --     Most recent vital signs: Vitals:   08/30/22 2151 08/30/22 2230  BP: (!) 144/68 (!) 136/55  Pulse: 62 (!) 59  Resp: 14 13  Temp: 97.7 F (36.5 C)   SpO2: 99% 98%     General: Alert and oriented, well-appearing. CV:  Good  peripheral perfusion.  Resp:  Normal effort.  Abd:  No distention.  Other:  EOMI.  PERRLA.  No facial droop.  Motor and sensory intact in all extremities.  No pronator drift.  No ataxia on finger-to-nose.   ED Results / Procedures / Treatments   Labs (all labs ordered are listed, but only abnormal results are displayed) Labs Reviewed  COMPREHENSIVE METABOLIC PANEL - Abnormal; Notable for the following components:      Result Value   Potassium 3.4 (*)    Glucose, Bld 106 (*)    Calcium 8.6 (*)    AST 13 (*)    Alkaline Phosphatase 127 (*)    All other components within normal limits  CBC WITH DIFFERENTIAL/PLATELET - Abnormal; Notable for the following components:   Hemoglobin 12.6 (*)    All other components within normal limits  URINALYSIS, ROUTINE W REFLEX MICROSCOPIC  TROPONIN I (HIGH SENSITIVITY)     EKG  ED ECG REPORT I, Arta Silence, the attending physician, personally viewed and interpreted this ECG.  Date: 08/30/2022  EKG Time: 2157 Rate: 57 Rhythm: normal sinus rhythm QRS Axis: normal Intervals: Nonspecific IVCD with LAD ST/T Wave abnormalities: Nonspecific ST abnormalities Narrative Interpretation: No evidence of acute ischemia; improved ST abnormalities when compared to EKG of 06/26/2022    RADIOLOGY  CT head: I independently viewed and interpreted the images; there is no ICH.  Radiology report indicates hypodensity in the right frontal lobe which appears subacute to chronic.   PROCEDURES:  Critical Care performed: No  Procedures   MEDICATIONS ORDERED IN ED: Medications  sodium chloride 0.9 % bolus 500 mL (0 mLs Intravenous Stopped 08/30/22 2318)     IMPRESSION / MDM / ASSESSMENT AND PLAN / ED COURSE  I reviewed the triage vital signs and the nursing notes.  81 year old male with PMH as noted above presents with an episode of dizziness described both as vertigo and near syncope and now resolved.  He denies any acute symptoms at this  time.  I reviewed the past medical records.  The patient was most recently seen by cardiothoracic surgery on 8/7 due to left lower lobe non-small cell carcinoma and evaluated for possible surgery although he is awaiting cardiology clearance.  On exam the vital signs are normal except for borderline hypertension.  The patient is overall well-appearing.  Thorough neurologic exam is normal.   Differential diagnosis includes, but is not limited to, vasovagal syncope, dehydration, hypovolemia, electrolyte abnormality, other metabolic cause, peripheral vertigo, ACS or other cardiac etiology, less likely CNS cause.  The patient has no neurologic deficits and no ongoing vertigo so I do not suspect acute stroke.  There is no clinical evidence for PE.  Patient's presentation is most consistent with acute presentation with potential threat to life or bodily function.  We will obtain basic and cardiac labs, urinalysis, CT throughout intracranial hemorrhage or other acute abnormality, and reassess.  The patient is on the cardiac monitor to evaluate for evidence of arrhythmia and/or significant heart rate changes.  ----------------------------------------- 11:20 PM on 08/30/2022 -----------------------------------------  CT report indicates that there is a right frontal hypodensity that was not seen on the most recent CT in 2019, but appears subacute to chronic, and there is no evidence of acute infarct or hemorrhage.  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  The patient does not have signs or symptoms of acute stroke.  There is no indication for repeat MRI today.  CMP and CBC are unremarkable.  First troponin is negative.  We will await a repeat troponin as well as a urinalysis.  If these are negative and the patient continues to be asymptomatic, he likely will  be appropriate for discharge home.  I signed him out to the oncoming ED physician Dr. Beather Arbour.   FINAL CLINICAL IMPRESSION(S) / ED DIAGNOSES   Final diagnoses:  Vertigo  Near syncope     Rx / DC Orders   ED Discharge Orders     None        Note:  This document was prepared using Dragon voice recognition software and may include unintentional dictation errors.    Arta Silence, MD 08/30/22 2322

## 2022-08-31 LAB — TROPONIN I (HIGH SENSITIVITY): Troponin I (High Sensitivity): 7 ng/L (ref <18)

## 2022-08-31 MED ORDER — MECLIZINE HCL 25 MG PO TABS: 25.0000 mg | ORAL_TABLET | Freq: Three times a day (TID) | ORAL | 0 refills | Status: AC | PRN

## 2022-08-31 MED ORDER — ONDANSETRON 4 MG PO TBDP: 4.0000 mg | ORAL_TABLET | Freq: Three times a day (TID) | ORAL | 0 refills | Status: DC | PRN

## 2022-08-31 NOTE — Discharge Instructions (Signed)
You may take medicines as needed for dizziness and nausea (Meclizine/Zofran).  Drink plenty of fluids daily.  Return to the ER for recurrent or worsening symptoms, persistent vomiting, difficulty breathing, lethargy or other concerns.

## 2022-08-31 NOTE — ED Provider Notes (Signed)
-----------------------------------------   23:35 PM on 08/30/2022 -----------------------------------------   Assumed care of patient who is awaiting repeat troponin and UA.  Voices no complaints at this time.  Spouse and son at bedside.   ----------------------------------------- 12:58 AM on 08/31/2022 -----------------------------------------   Repeat troponin negative.  UA remarkable for mild ketonuria.  Patient voicing no complaints of dizziness at this time.  Will discharge home with as needed prescriptions for meclizine and Zofran.  He will follow-up closely with his PCP.  Strict return precautions given.  Patient and family members verbalized understanding agree with plan of care.   Paulette Blanch, MD 08/31/22 [phone]

## 2022-09-02 DIAGNOSIS — I2510 Atherosclerotic heart disease of native coronary artery without angina pectoris: Secondary | ICD-10-CM | POA: Diagnosis not present

## 2022-09-04 ENCOUNTER — Encounter: Payer: Self-pay | Admitting: *Deleted

## 2022-09-04 ENCOUNTER — Other Ambulatory Visit: Payer: Self-pay | Admitting: *Deleted

## 2022-09-04 DIAGNOSIS — C3492 Malignant neoplasm of unspecified part of left bronchus or lung: Secondary | ICD-10-CM

## 2022-09-04 NOTE — Progress Notes (Signed)
Received call from pt to inform us that he has been scheduled for lung resection on 9/28. Per Dr. Grayland Ormond, pt will need to follow up 2-3 weeks after surgery. Pt made aware to expect call from scheduling with his follow up appt to be scheduled on 10/17. Instructed pt to call with any questions or needs. Pt verbalized understanding.

## 2022-09-15 NOTE — Pre-Procedure Instructions (Signed)
Surgical Instructions    Your procedure is scheduled on September 18, 2022 .  Report to [facility] Main Entrance "A" at 5:30 A.M., then check in with the Admitting office.  Call this number if you have problems the morning of surgery:  [phone]   If you have any questions prior to your surgery date call [phone]: Open Monday-Friday 8am-4pm    Remember:  Do not eat or drink after midnight the night before your surgery    Take these medicines the morning of surgery with A SIP OF WATER:  rosuvastatin (CRESTOR)  metoprolol tartrate (LOPRESSOR) - may take as needed   As of today, STOP taking any Aspirin (unless otherwise instructed by your surgeon) Aleve, Naproxen, Ibuprofen, Motrin, Advil, Goody's, BC's, all herbal medications, fish oil, and all vitamins.                     Do NOT Smoke (Tobacco/Vaping) for 24 hours prior to your procedure.  If you use a CPAP at night, you may bring your mask/headgear for your overnight stay.   Contacts, glasses, piercing's, hearing aid's, dentures or partials may not be worn into surgery, please bring cases for these belongings.    For patients admitted to the hospital, discharge time will be determined by your treatment team.   Patients discharged the day of surgery will not be allowed to drive home, and someone needs to stay with them for 24 hours.  SURGICAL WAITING ROOM VISITATION Patients having surgery or a procedure may have no more than 2 support people in the waiting area - these visitors may rotate.   Children under the age of 63 must have an adult with them who is not the patient. If the patient needs to stay at the hospital during part of their recovery, the visitor guidelines for inpatient rooms apply. Pre-op nurse will coordinate an appropriate time for 1 support person to accompany patient in pre-op.  This support person may not rotate.   Please refer to the [facility] website for the visitor guidelines for Inpatients (after  your surgery is over and you are in a regular room).    Special instructions:   [facility]- Preparing For Surgery  Before surgery, you can play an important role. Because skin is not sterile, your skin needs to be as free of germs as possible. You can reduce the number of germs on your skin by washing with CHG (chlorahexidine gluconate) Soap before surgery.  CHG is an antiseptic cleaner which kills germs and bonds with the skin to continue killing germs even after washing.    Oral Hygiene is also important to reduce your risk of infection.  Remember - BRUSH YOUR TEETH THE MORNING OF SURGERY WITH YOUR REGULAR TOOTHPASTE  Please do not use if you have an allergy to CHG or antibacterial soaps. If your skin becomes reddened/irritated stop using the CHG.  Do not shave (including legs and underarms) for at least 48 hours prior to first CHG shower. It is OK to shave your face.  Please follow these instructions carefully.   Shower the NIGHT BEFORE SURGERY and the MORNING OF SURGERY  If you chose to wash your hair, wash your hair first as usual with your normal shampoo.  After you shampoo, rinse your hair and body thoroughly to remove the shampoo.  Use CHG Soap as you would any other liquid soap. You can apply CHG directly to the skin and wash gently with a scrungie or a clean  washcloth.   Apply the CHG Soap to your body ONLY FROM THE NECK DOWN.  Do not use on open wounds or open sores. Avoid contact with your eyes, ears, mouth and genitals (private parts). Wash Face and genitals (private parts)  with your normal soap.   Wash thoroughly, paying special attention to the area where your surgery will be performed.  Thoroughly rinse your body with warm water from the neck down.  DO NOT shower/wash with your normal soap after using and rinsing off the CHG Soap.  Pat yourself dry with a CLEAN TOWEL.  Wear CLEAN PAJAMAS to bed the night before surgery  Place CLEAN SHEETS on your bed the night  before your surgery  DO NOT SLEEP WITH PETS.   Day of Surgery: Take a shower with CHG soap. Do not wear jewelry or makeup Do not wear lotions, powders, perfumes/colognes, or deodorant. Do not shave 48 hours prior to surgery.  Men may shave face and neck. Do not bring valuables to the hospital.  [facility] is not responsible for any belongings or valuables. Do not wear nail polish, gel polish, artificial nails, or any other type of covering on natural nails (fingers and toes) If you have artificial nails or gel coating that need to be removed by a nail salon, please have this removed prior to surgery. Artificial nails or gel coating may interfere with anesthesia's ability to adequately monitor your vital signs.  Wear Clean/Comfortable clothing the morning of surgery Remember to brush your teeth WITH YOUR REGULAR TOOTHPASTE.   Please read over the following fact sheets that you were given.    If you received a COVID test during your pre-op visit  it is requested that you wear a mask when out in public, stay away from anyone that may not be feeling well and notify your surgeon if you develop symptoms. If you have been in contact with anyone that has tested positive in the last 10 days please notify you surgeon.

## 2022-09-16 ENCOUNTER — Ambulatory Visit (HOSPITAL_COMMUNITY)
Admission: RE | Admit: 2022-09-16 | Discharge: 2022-09-16 | Disposition: A | Discharge status: Home / Self Care | Payer: PPO | Source: Ambulatory Visit | Attending: Thoracic Surgery (Cardiothoracic Vascular Surgery) | Admitting: Thoracic Surgery (Cardiothoracic Vascular Surgery)

## 2022-09-16 ENCOUNTER — Other Ambulatory Visit: Payer: Self-pay

## 2022-09-16 ENCOUNTER — Encounter (HOSPITAL_COMMUNITY): Payer: Self-pay

## 2022-09-16 ENCOUNTER — Encounter (HOSPITAL_COMMUNITY)
Admission: RE | Admit: 2022-09-16 | Discharge: 2022-09-16 | Disposition: A | Discharge status: Home / Self Care | Payer: PPO | Source: Ambulatory Visit | Attending: Thoracic Surgery (Cardiothoracic Vascular Surgery) | Admitting: Thoracic Surgery (Cardiothoracic Vascular Surgery)

## 2022-09-16 VITALS — BP 150/75 | HR 73 | Temp 98.1°F | Resp 17 | Ht 66.0 in | Wt 147.6 lb

## 2022-09-16 DIAGNOSIS — Z882 Allergy status to sulfonamides status: Secondary | ICD-10-CM | POA: Diagnosis not present

## 2022-09-16 DIAGNOSIS — K429 Umbilical hernia without obstruction or gangrene: Secondary | ICD-10-CM | POA: Diagnosis present

## 2022-09-16 DIAGNOSIS — I739 Peripheral vascular disease, unspecified: Secondary | ICD-10-CM | POA: Diagnosis present

## 2022-09-16 DIAGNOSIS — I25118 Atherosclerotic heart disease of native coronary artery with other forms of angina pectoris: Secondary | ICD-10-CM | POA: Diagnosis present

## 2022-09-16 DIAGNOSIS — Z01812 Encounter for preprocedural laboratory examination: Secondary | ICD-10-CM | POA: Insufficient documentation

## 2022-09-16 DIAGNOSIS — Z85828 Personal history of other malignant neoplasm of skin: Secondary | ICD-10-CM | POA: Diagnosis not present

## 2022-09-16 DIAGNOSIS — J431 Panlobular emphysema: Secondary | ICD-10-CM | POA: Diagnosis present

## 2022-09-16 DIAGNOSIS — Z86718 Personal history of other venous thrombosis and embolism: Secondary | ICD-10-CM | POA: Diagnosis not present

## 2022-09-16 DIAGNOSIS — J939 Pneumothorax, unspecified: Secondary | ICD-10-CM | POA: Diagnosis not present

## 2022-09-16 DIAGNOSIS — R911 Solitary pulmonary nodule: Secondary | ICD-10-CM | POA: Diagnosis present

## 2022-09-16 DIAGNOSIS — Z79899 Other long term (current) drug therapy: Secondary | ICD-10-CM | POA: Diagnosis not present

## 2022-09-16 DIAGNOSIS — J9811 Atelectasis: Secondary | ICD-10-CM | POA: Diagnosis not present

## 2022-09-16 DIAGNOSIS — D62 Acute posthemorrhagic anemia: Secondary | ICD-10-CM | POA: Diagnosis not present

## 2022-09-16 DIAGNOSIS — Z20822 Contact with and (suspected) exposure to covid-19: Secondary | ICD-10-CM | POA: Diagnosis present

## 2022-09-16 DIAGNOSIS — J439 Emphysema, unspecified: Secondary | ICD-10-CM | POA: Insufficient documentation

## 2022-09-16 DIAGNOSIS — Z951 Presence of aortocoronary bypass graft: Secondary | ICD-10-CM | POA: Diagnosis not present

## 2022-09-16 DIAGNOSIS — I129 Hypertensive chronic kidney disease with stage 1 through stage 4 chronic kidney disease, or unspecified chronic kidney disease: Secondary | ICD-10-CM | POA: Diagnosis present

## 2022-09-16 DIAGNOSIS — I2510 Atherosclerotic heart disease of native coronary artery without angina pectoris: Secondary | ICD-10-CM | POA: Diagnosis not present

## 2022-09-16 DIAGNOSIS — K5732 Diverticulitis of large intestine without perforation or abscess without bleeding: Secondary | ICD-10-CM | POA: Diagnosis present

## 2022-09-16 DIAGNOSIS — E785 Hyperlipidemia, unspecified: Secondary | ICD-10-CM | POA: Insufficient documentation

## 2022-09-16 DIAGNOSIS — I10 Essential (primary) hypertension: Secondary | ICD-10-CM | POA: Insufficient documentation

## 2022-09-16 DIAGNOSIS — C3492 Malignant neoplasm of unspecified part of left bronchus or lung: Secondary | ICD-10-CM

## 2022-09-16 DIAGNOSIS — Z87891 Personal history of nicotine dependence: Secondary | ICD-10-CM | POA: Insufficient documentation

## 2022-09-16 DIAGNOSIS — E538 Deficiency of other specified B group vitamins: Secondary | ICD-10-CM | POA: Diagnosis present

## 2022-09-16 DIAGNOSIS — I6523 Occlusion and stenosis of bilateral carotid arteries: Secondary | ICD-10-CM | POA: Insufficient documentation

## 2022-09-16 DIAGNOSIS — Z01818 Encounter for other preprocedural examination: Secondary | ICD-10-CM | POA: Diagnosis not present

## 2022-09-16 DIAGNOSIS — M1990 Unspecified osteoarthritis, unspecified site: Secondary | ICD-10-CM | POA: Diagnosis present

## 2022-09-16 DIAGNOSIS — R599 Enlarged lymph nodes, unspecified: Secondary | ICD-10-CM | POA: Diagnosis present

## 2022-09-16 DIAGNOSIS — N183 Chronic kidney disease, stage 3 unspecified: Secondary | ICD-10-CM | POA: Diagnosis present

## 2022-09-16 DIAGNOSIS — Z8616 Personal history of COVID-19: Secondary | ICD-10-CM | POA: Diagnosis not present

## 2022-09-16 DIAGNOSIS — Z7951 Long term (current) use of inhaled steroids: Secondary | ICD-10-CM | POA: Diagnosis not present

## 2022-09-16 DIAGNOSIS — Z4682 Encounter for fitting and adjustment of non-vascular catheter: Secondary | ICD-10-CM | POA: Diagnosis not present

## 2022-09-16 DIAGNOSIS — I700 Atherosclerosis of aorta: Secondary | ICD-10-CM | POA: Diagnosis present

## 2022-09-16 DIAGNOSIS — C3432 Malignant neoplasm of lower lobe, left bronchus or lung: Secondary | ICD-10-CM | POA: Diagnosis present

## 2022-09-16 DIAGNOSIS — Z902 Acquired absence of lung [part of]: Secondary | ICD-10-CM | POA: Diagnosis not present

## 2022-09-16 DIAGNOSIS — Z801 Family history of malignant neoplasm of trachea, bronchus and lung: Secondary | ICD-10-CM | POA: Diagnosis not present

## 2022-09-16 LAB — CBC
HCT: 38.6 % — ABNORMAL LOW (ref 39.0–52.0)
Hemoglobin: 12.6 g/dL — ABNORMAL LOW (ref 13.0–17.0)
MCH: 29.2 pg (ref 26.0–34.0)
MCHC: 32.6 g/dL (ref 30.0–36.0)
MCV: 89.6 fL (ref 80.0–100.0)
Platelets: 313 10*3/uL (ref 150–400)
RBC: 4.31 MIL/uL (ref 4.22–5.81)
RDW: 13.2 % (ref 11.5–15.5)
WBC: 9.6 10*3/uL (ref 4.0–10.5)
nRBC: 0 % (ref 0.0–0.2)

## 2022-09-16 LAB — COMPREHENSIVE METABOLIC PANEL
ALT: 9 U/L (ref 0–44)
AST: 15 U/L (ref 15–41)
Albumin: 3.2 g/dL — ABNORMAL LOW (ref 3.5–5.0)
Alkaline Phosphatase: 127 U/L — ABNORMAL HIGH (ref 38–126)
Anion gap: 8 (ref 5–15)
BUN: 14 mg/dL (ref 8–23)
CO2: 23 mmol/L (ref 22–32)
Calcium: 8.8 mg/dL — ABNORMAL LOW (ref 8.9–10.3)
Chloride: 106 mmol/L (ref 98–111)
Creatinine, Ser: 1.11 mg/dL (ref 0.61–1.24)
GFR, Estimated: >60 mL/min (ref >60)
Glucose, Bld: 89 mg/dL (ref 70–99)
Potassium: 3.9 mmol/L (ref 3.5–5.1)
Sodium: 137 mmol/L (ref 135–145)
Total Bilirubin: 0.7 mg/dL (ref 0.3–1.2)
Total Protein: 6.9 g/dL (ref 6.5–8.1)

## 2022-09-16 LAB — BLOOD GAS, ARTERIAL
Acid-base deficit: 1 mmol/L (ref 0.0–2.0)
Bicarbonate: 22.2 mmol/L (ref 20.0–28.0)
O2 Saturation: 100 %
Patient temperature: 36.7
pCO2 arterial: 32 mmHg (ref 32–48)
pH, Arterial: 7.45 (ref 7.35–7.45)
pO2, Arterial: 111 mmHg — ABNORMAL HIGH (ref 83–108)

## 2022-09-16 LAB — URINALYSIS, ROUTINE W REFLEX MICROSCOPIC
Bilirubin Urine: NEGATIVE
Glucose, UA: NEGATIVE mg/dL
Hgb urine dipstick: NEGATIVE
Ketones, ur: NEGATIVE mg/dL
Leukocytes,Ua: NEGATIVE
Nitrite: NEGATIVE
Protein, ur: NEGATIVE mg/dL
Specific Gravity, Urine: 1.025 (ref 1.005–1.030)
pH: 6 (ref 5.0–8.0)

## 2022-09-16 LAB — APTT: aPTT: 32 seconds (ref 24–36)

## 2022-09-16 LAB — SURGICAL PCR SCREEN
MRSA, PCR: NEGATIVE
Staphylococcus aureus: NEGATIVE

## 2022-09-16 LAB — PROTIME-INR
INR: 1.1 (ref 0.8–1.2)
Prothrombin Time: 14.1 seconds (ref 11.4–15.2)

## 2022-09-16 NOTE — Progress Notes (Signed)
PCP - Dr. Rusty Aus Cardiologist - Dr. Flossie Dibble  PPM/ICD - Denies Device Orders - n/a Rep Notified - n/a  Chest x-ray - 09/16/2022 EKG - 08/30/2022 Stress Test - 09/02/2022 ECHO - 09/02/2022 Cardiac Cath - 01/14/2019  Sleep Study - Denies CPAP - n/a  No DM  Blood Thinner Instructions: n/a Aspirin Instructions: n/a  NPO after midnight  COVID TEST- Yes. Result pending   Anesthesia review: Yes. Cardiac Clearance records/testing results requested.  Patient denies shortness of breath, fever, cough and chest pain at PAT appointment   All instructions explained to the patient, with a verbal understanding of the material. Patient agrees to go over the instructions while at home for a better understanding. Patient also instructed to self quarantine after being tested for COVID-19. The opportunity to ask questions was provided.

## 2022-09-17 LAB — SARS CORONAVIRUS 2 (TAT 6-24 HRS): SARS Coronavirus 2: NEGATIVE

## 2022-09-17 NOTE — Progress Notes (Signed)
Anesthesia Chart Review:  Follows with cardiologist Dr. 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  Last seen 08/13/2022 and he was reporting some increased DOE.  Stress and echo were ordered.  Echo showed normal biventricular function and no significant valvular abnormalities.  Nuclear stress showed normal perfusion without evidence of myocardial ischemia.  Former smoker with associated COPD.  Recent work-up for worsening DOE.  CXR showed left lower lobe cavitary lung nodule.  CT confirmed 4.3 cm cavitary left lower lobe lung nodule with borderline hilar node.  On PET/CT the mass was markedly hypermetabolic but the node was equivocal.  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  Preop labs reviewed, unremarkable.  EKG 08/30/2022: Sinus bradycardia.  Rate 57.  Nonspecific IVCD with LAD.  Probable lateral infarct, age-indeterminate.  PFT 07/31/2022: FVC-%Pred-Pre % 95  FEV1-%Pred-Pre % 85  FEV1FVC-%Pred-Pre % 87  TLC % pred % 81  RV % pred % 70  DLCO unc % pred % 50   CT chest 06/12/2022: IMPRESSION: 1. Thick walled cavitary lesion in the left lower lobe measuring 4.3 x 3.1 x 2.8 cm with irregular and spiculated margins and mild surrounding ground-glass. Findings highly suspicious for primary bronchogenic malignancy. The possibility of necrotic pneumonia is considered but felt less likely. Recommend oncologic referral and follow-up. 2. A 10 mm low left hilar node is nonspecific in the setting. No other adenopathy. 3. Punctate subpleural nodule in the right lower lobe. 4. Moderate emphysema. 5. Post CABG with native coronary artery calcification.  TTE 09/02/2022 (Care Everywhere): INTERPRETATION  NORMAL LEFT VENTRICULAR SYSTOLIC FUNCTION  NORMAL RIGHT VENTRICULAR SYSTOLIC FUNCTION  MILD VALVULAR REGURGITATION (See above)  NO VALVULAR STENOSIS   IRREGULAR HEART RHYTHM CAPTURED THROUGHOUT EXAM  ESTIMATED LVEF >55%  CALCULATED: 70.0%  Aortic: TRACE AI  Mitral: TRIVIAL MR  Tricuspid: MILD TR (3.73m/s; RVSP: 47mmHg)  Pulmonic: TRIVIAL PI   Nuclear stress 09/02/2022 (Care Everywhere): Normal Lexiscan infusion EKG  Normal myocardial perfusion without evidence of myocardial ischemia     Wynonia Musty [facility] Short Stay Center/Anesthesiology Phone [phone] 09/17/2022 10:31 AM

## 2022-09-17 NOTE — Anesthesia Preprocedure Evaluation (Addendum)
Anesthesia Evaluation  Patient identified by MRN, date of birth, ID band Patient awake    Reviewed: Allergy & Precautions, NPO status , Patient's Chart, lab work & pertinent test results, reviewed documented beta blocker date and time   History of Anesthesia Complications Negative for: history of anesthetic complications  Airway Mallampati: I  TM Distance: >3 FB Neck ROM: Full    Dental  (+) Edentulous Upper, Edentulous Lower, Upper Dentures   Pulmonary COPD, former smoker,    breath sounds clear to auscultation       Cardiovascular hypertension, Pt. on medications and Pt. on home beta blockers (-) angina+ CAD, + CABG and + Peripheral Vascular Disease  + dysrhythmias Supra Ventricular Tachycardia  Rhythm:Regular Rate:Normal  '20 ECHO:  EF 60-65%, normal LVF, normal RV, no significant valvular abnormalities  Nuclear stress 09/02/2022 (Care Everywhere): Normal Lexiscan infusion EKG  Normal myocardial perfusion without evidence of myocardial ischemia    Neuro/Psych  Neuromuscular disease (ulnar neuropathy) negative psych ROS   GI/Hepatic negative GI ROS, Neg liver ROS,   Endo/Other  negative endocrine ROS  Renal/GU Renal InsufficiencyRenal disease     Musculoskeletal   Abdominal   Peds  Hematology negative hematology ROS (+)   Anesthesia Other Findings   Reproductive/Obstetrics                           Anesthesia Physical Anesthesia Plan  ASA: 3  Anesthesia Plan: General   Post-op Pain Management: Tylenol PO (pre-op)*   Induction: Intravenous  PONV Risk Score and Plan: 2 and Ondansetron and Dexamethasone  Airway Management Planned: Oral ETT and Double Lumen EBT  Additional Equipment: Arterial line  Intra-op Plan:   Post-operative Plan: Extubation in OR  Informed Consent: I have reviewed the patients History and Physical, chart, labs and discussed the procedure including the  risks, benefits and alternatives for the proposed anesthesia with the patient or authorized representative who has indicated his/her understanding and acceptance.       Plan Discussed with: CRNA and Surgeon  Anesthesia Plan Comments: (PAT note by Karoline Caldwell, PA-C: Follows with cardiologist Dr. 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  Last seen 08/13/2022 and he was reporting some increased DOE.  Stress and echo were ordered.  Echo showed normal biventricular function and no significant valvular abnormalities.  Nuclear stress showed normal perfusion without evidence of myocardial ischemia.  Former smoker with associated COPD.  Recent work-up for worsening DOE.  CXR showed left lower lobe cavitary lung nodule.  CT confirmed 4.3 cm cavitary left lower lobe lung nodule with borderline hilar node.  On PET/CT the mass was markedly hypermetabolic but the node was equivocal.  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  Preop labs reviewed, unremarkable.  EKG 08/30/2022: Sinus bradycardia.  Rate 57.  Nonspecific IVCD with LAD.  Probable lateral infarct, age-indeterminate.  PFT 07/31/2022: FVC-%Pred-Pre % 95 FEV1-%Pred-Pre % 85 FEV1FVC-%Pred-Pre % 87 TLC % pred % 81 RV % pred % 70 DLCO unc % pred % 50  CT chest 06/12/2022: IMPRESSION: 1. Thick walled cavitary lesion in the left lower lobe measuring 4.3 x 3.1 x 2.8 cm with irregular and spiculated margins and mild surrounding ground-glass. Findings highly suspicious for primary bronchogenic malignancy. The possibility of necrotic pneumonia is considered but felt less likely. Recommend oncologic referral and follow-up.  2. A 10 mm low left hilar node is nonspecific in the setting. No other adenopathy. 3. Punctate subpleural nodule in the right lower lobe. 4. Moderate emphysema. 5. Post CABG with  native coronary artery calcification.  TTE 09/02/2022 (Care Everywhere): INTERPRETATION  NORMAL LEFT VENTRICULAR SYSTOLIC FUNCTION  NORMAL RIGHT VENTRICULAR SYSTOLIC FUNCTION  MILD VALVULAR REGURGITATION (See above)  NO VALVULAR STENOSIS  IRREGULAR HEART RHYTHM CAPTURED THROUGHOUT EXAM  ESTIMATED LVEF >55%  CALCULATED: 70.0%  Aortic: TRACE AI  Mitral: TRIVIAL MR  Tricuspid: MILD TR (3.101m/s; RVSP: 6mmHg)  Pulmonic: TRIVIAL PI   Nuclear stress 09/02/2022 (Care Everywhere): Normal Lexiscan infusion EKG  Normal myocardial perfusion without evidence of myocardial ischemia  )      Anesthesia Quick Evaluation

## 2022-09-18 ENCOUNTER — Other Ambulatory Visit: Payer: Self-pay

## 2022-09-18 ENCOUNTER — Encounter (HOSPITAL_COMMUNITY)
Admission: RE | Disposition: A | Discharge status: Home / Self Care | Payer: Self-pay | Source: Home / Self Care | Attending: Thoracic Surgery (Cardiothoracic Vascular Surgery)

## 2022-09-18 ENCOUNTER — Inpatient Hospital Stay (HOSPITAL_COMMUNITY): Payer: PPO | Admitting: Physician Assistant

## 2022-09-18 ENCOUNTER — Inpatient Hospital Stay (HOSPITAL_COMMUNITY): Payer: PPO

## 2022-09-18 ENCOUNTER — Inpatient Hospital Stay (HOSPITAL_COMMUNITY): Payer: PPO | Admitting: Anesthesiology

## 2022-09-18 ENCOUNTER — Inpatient Hospital Stay (HOSPITAL_COMMUNITY)
Admission: RE | Admit: 2022-09-18 | Discharge: 2022-09-20 | DRG: 164 | Disposition: A | Discharge status: Home / Self Care | Payer: PPO | Attending: Thoracic Surgery (Cardiothoracic Vascular Surgery) | Admitting: Thoracic Surgery (Cardiothoracic Vascular Surgery)

## 2022-09-18 ENCOUNTER — Encounter (HOSPITAL_COMMUNITY): Payer: Self-pay | Admitting: Thoracic Surgery (Cardiothoracic Vascular Surgery)

## 2022-09-18 DIAGNOSIS — I739 Peripheral vascular disease, unspecified: Secondary | ICD-10-CM | POA: Diagnosis present

## 2022-09-18 DIAGNOSIS — E785 Hyperlipidemia, unspecified: Secondary | ICD-10-CM | POA: Diagnosis present

## 2022-09-18 DIAGNOSIS — Z20822 Contact with and (suspected) exposure to covid-19: Secondary | ICD-10-CM | POA: Diagnosis present

## 2022-09-18 DIAGNOSIS — Z87891 Personal history of nicotine dependence: Secondary | ICD-10-CM

## 2022-09-18 DIAGNOSIS — C3432 Malignant neoplasm of lower lobe, left bronchus or lung: Secondary | ICD-10-CM | POA: Diagnosis present

## 2022-09-18 DIAGNOSIS — Z8616 Personal history of COVID-19: Secondary | ICD-10-CM | POA: Diagnosis not present

## 2022-09-18 DIAGNOSIS — K429 Umbilical hernia without obstruction or gangrene: Secondary | ICD-10-CM | POA: Diagnosis present

## 2022-09-18 DIAGNOSIS — I10 Essential (primary) hypertension: Secondary | ICD-10-CM | POA: Diagnosis not present

## 2022-09-18 DIAGNOSIS — Z85828 Personal history of other malignant neoplasm of skin: Secondary | ICD-10-CM | POA: Diagnosis not present

## 2022-09-18 DIAGNOSIS — D62 Acute posthemorrhagic anemia: Secondary | ICD-10-CM | POA: Diagnosis not present

## 2022-09-18 DIAGNOSIS — Z801 Family history of malignant neoplasm of trachea, bronchus and lung: Secondary | ICD-10-CM | POA: Diagnosis not present

## 2022-09-18 DIAGNOSIS — Z86718 Personal history of other venous thrombosis and embolism: Secondary | ICD-10-CM

## 2022-09-18 DIAGNOSIS — R911 Solitary pulmonary nodule: Secondary | ICD-10-CM

## 2022-09-18 DIAGNOSIS — M1990 Unspecified osteoarthritis, unspecified site: Secondary | ICD-10-CM | POA: Diagnosis present

## 2022-09-18 DIAGNOSIS — Z7951 Long term (current) use of inhaled steroids: Secondary | ICD-10-CM

## 2022-09-18 DIAGNOSIS — E538 Deficiency of other specified B group vitamins: Secondary | ICD-10-CM | POA: Diagnosis present

## 2022-09-18 DIAGNOSIS — R599 Enlarged lymph nodes, unspecified: Secondary | ICD-10-CM | POA: Diagnosis present

## 2022-09-18 DIAGNOSIS — C3492 Malignant neoplasm of unspecified part of left bronchus or lung: Secondary | ICD-10-CM

## 2022-09-18 DIAGNOSIS — I2510 Atherosclerotic heart disease of native coronary artery without angina pectoris: Secondary | ICD-10-CM | POA: Diagnosis not present

## 2022-09-18 DIAGNOSIS — Z79899 Other long term (current) drug therapy: Secondary | ICD-10-CM

## 2022-09-18 DIAGNOSIS — Z882 Allergy status to sulfonamides status: Secondary | ICD-10-CM | POA: Diagnosis not present

## 2022-09-18 DIAGNOSIS — J431 Panlobular emphysema: Secondary | ICD-10-CM | POA: Diagnosis present

## 2022-09-18 DIAGNOSIS — N183 Chronic kidney disease, stage 3 unspecified: Secondary | ICD-10-CM | POA: Diagnosis present

## 2022-09-18 DIAGNOSIS — K5732 Diverticulitis of large intestine without perforation or abscess without bleeding: Secondary | ICD-10-CM | POA: Diagnosis present

## 2022-09-18 DIAGNOSIS — Z951 Presence of aortocoronary bypass graft: Secondary | ICD-10-CM

## 2022-09-18 DIAGNOSIS — Z888 Allergy status to other drugs, medicaments and biological substances status: Secondary | ICD-10-CM

## 2022-09-18 DIAGNOSIS — I129 Hypertensive chronic kidney disease with stage 1 through stage 4 chronic kidney disease, or unspecified chronic kidney disease: Secondary | ICD-10-CM | POA: Diagnosis present

## 2022-09-18 DIAGNOSIS — I700 Atherosclerosis of aorta: Secondary | ICD-10-CM | POA: Diagnosis present

## 2022-09-18 DIAGNOSIS — Z902 Acquired absence of lung [part of]: Principal | ICD-10-CM

## 2022-09-18 DIAGNOSIS — I25118 Atherosclerotic heart disease of native coronary artery with other forms of angina pectoris: Secondary | ICD-10-CM | POA: Diagnosis present

## 2022-09-18 HISTORY — PX: INTERCOSTAL NERVE BLOCK: SHX5021

## 2022-09-18 HISTORY — PX: LYMPH NODE DISSECTION: SHX5087

## 2022-09-18 SURGERY — LOBECTOMY, LUNG, ROBOT-ASSISTED, USING VATS
Anesthesia: General | Site: Chest | Laterality: Left

## 2022-09-18 MED ORDER — ONDANSETRON HCL 4 MG/2ML IJ SOLN: 4.0000 mg | Freq: Four times a day (QID) | INTRAMUSCULAR | Status: DC | PRN

## 2022-09-18 MED ORDER — METOPROLOL TARTRATE 50 MG PO TABS: 50.0000 mg | ORAL_TABLET | ORAL | Status: DC | PRN

## 2022-09-18 MED ORDER — SODIUM CHLORIDE FLUSH 0.9 % IV SOLN
INTRAVENOUS | Status: DC | PRN
  Administered 2022-09-18: 100 mL

## 2022-09-18 MED ORDER — PROPOFOL 10 MG/ML IV BOLUS
INTRAVENOUS | Status: AC
  Filled 2022-09-18: qty 20

## 2022-09-18 MED ORDER — BUPIVACAINE HCL (PF) 0.5 % IJ SOLN
INTRAMUSCULAR | Status: AC
  Filled 2022-09-18: qty 30

## 2022-09-18 MED ORDER — ROCURONIUM BROMIDE 10 MG/ML (PF) SYRINGE
PREFILLED_SYRINGE | INTRAVENOUS | Status: DC | PRN
  Administered 2022-09-18: 70 mg via INTRAVENOUS
  Administered 2022-09-18 (×2): 30 mg via INTRAVENOUS

## 2022-09-18 MED ORDER — ACETAMINOPHEN 500 MG PO TABS
1000.0000 mg | ORAL_TABLET | Freq: Once | ORAL | Status: AC
  Administered 2022-09-18: 1000 mg via ORAL
  Filled 2022-09-18: qty 2

## 2022-09-18 MED ORDER — FENTANYL CITRATE (PF) 250 MCG/5ML IJ SOLN
INTRAMUSCULAR | Status: AC
  Filled 2022-09-18: qty 5

## 2022-09-18 MED ORDER — SODIUM CHLORIDE 0.9 % IV SOLN: INTRAVENOUS | Status: DC

## 2022-09-18 MED ORDER — ACETAMINOPHEN 160 MG/5ML PO SOLN: 1000.0000 mg | Freq: Four times a day (QID) | ORAL | Status: DC

## 2022-09-18 MED ORDER — DEXAMETHASONE SODIUM PHOSPHATE 10 MG/ML IJ SOLN
INTRAMUSCULAR | Status: AC
  Filled 2022-09-18: qty 1

## 2022-09-18 MED ORDER — 0.9 % SODIUM CHLORIDE (POUR BTL) OPTIME
TOPICAL | Status: DC | PRN
  Administered 2022-09-18: 2000 mL

## 2022-09-18 MED ORDER — ORAL CARE MOUTH RINSE: 15.0000 mL | Freq: Once | OROMUCOSAL | Status: AC

## 2022-09-18 MED ORDER — BISACODYL 5 MG PO TBEC
10.0000 mg | DELAYED_RELEASE_TABLET | Freq: Every day | ORAL | Status: DC
  Administered 2022-09-18 – 2022-09-19 (×2): 10 mg via ORAL
  Filled 2022-09-18 (×2): qty 2

## 2022-09-18 MED ORDER — UMECLIDINIUM BROMIDE 62.5 MCG/ACT IN AEPB
1.0000 | INHALATION_SPRAY | Freq: Every day | RESPIRATORY_TRACT | Status: DC
  Administered 2022-09-19 – 2022-09-20 (×2): 1 via RESPIRATORY_TRACT
  Filled 2022-09-18: qty 7

## 2022-09-18 MED ORDER — ACETAMINOPHEN 500 MG PO TABS
1000.0000 mg | ORAL_TABLET | Freq: Four times a day (QID) | ORAL | Status: DC
  Administered 2022-09-18 – 2022-09-20 (×6): 1000 mg via ORAL
  Filled 2022-09-18 (×6): qty 2

## 2022-09-18 MED ORDER — LIDOCAINE 2% (20 MG/ML) 5 ML SYRINGE
INTRAMUSCULAR | Status: AC
  Filled 2022-09-18: qty 5

## 2022-09-18 MED ORDER — FENTANYL CITRATE (PF) 100 MCG/2ML IJ SOLN
INTRAMUSCULAR | Status: DC | PRN
  Administered 2022-09-18: 250 ug via INTRAVENOUS

## 2022-09-18 MED ORDER — ONDANSETRON HCL 4 MG/2ML IJ SOLN
INTRAMUSCULAR | Status: AC
  Filled 2022-09-18: qty 2

## 2022-09-18 MED ORDER — ROCURONIUM BROMIDE 10 MG/ML (PF) SYRINGE
PREFILLED_SYRINGE | INTRAVENOUS | Status: AC
  Filled 2022-09-18: qty 10

## 2022-09-18 MED ORDER — SODIUM CHLORIDE 0.9% IV SOLUTION
INTRAVENOUS | Status: AC | PRN
  Administered 2022-09-18: 1000 mL

## 2022-09-18 MED ORDER — PROMETHAZINE HCL 25 MG/ML IJ SOLN: 6.2500 mg | INTRAMUSCULAR | Status: DC | PRN

## 2022-09-18 MED ORDER — PANTOPRAZOLE SODIUM 40 MG PO TBEC
40.0000 mg | DELAYED_RELEASE_TABLET | Freq: Every day | ORAL | Status: DC
  Administered 2022-09-19: 40 mg via ORAL
  Filled 2022-09-18: qty 1

## 2022-09-18 MED ORDER — CHLORHEXIDINE GLUCONATE 0.12 % MT SOLN
15.0000 mL | Freq: Once | OROMUCOSAL | Status: AC
  Administered 2022-09-18: 15 mL via OROMUCOSAL
  Filled 2022-09-18: qty 15

## 2022-09-18 MED ORDER — OXYCODONE HCL 5 MG PO TABS
5.0000 mg | ORAL_TABLET | ORAL | Status: DC | PRN
  Administered 2022-09-19: 5 mg via ORAL
  Filled 2022-09-18: qty 1

## 2022-09-18 MED ORDER — OXYCODONE HCL 5 MG PO TABS: 5.0000 mg | ORAL_TABLET | Freq: Once | ORAL | Status: DC | PRN

## 2022-09-18 MED ORDER — BUPIVACAINE LIPOSOME 1.3 % IJ SUSP
INTRAMUSCULAR | Status: AC
  Filled 2022-09-18: qty 20

## 2022-09-18 MED ORDER — MIDAZOLAM HCL 2 MG/2ML IJ SOLN: 0.5000 mg | Freq: Once | INTRAMUSCULAR | Status: DC | PRN

## 2022-09-18 MED ORDER — FLUTICASONE FUROATE-VILANTEROL 100-25 MCG/ACT IN AEPB
1.0000 | INHALATION_SPRAY | Freq: Every day | RESPIRATORY_TRACT | Status: DC
  Administered 2022-09-19 – 2022-09-20 (×2): 1 via RESPIRATORY_TRACT
  Filled 2022-09-18: qty 28

## 2022-09-18 MED ORDER — SENNOSIDES-DOCUSATE SODIUM 8.6-50 MG PO TABS
1.0000 | ORAL_TABLET | Freq: Every day | ORAL | Status: DC
  Administered 2022-09-18: 1 via ORAL
  Filled 2022-09-18 (×2): qty 1

## 2022-09-18 MED ORDER — MECLIZINE HCL 25 MG PO TABS: 25.0000 mg | ORAL_TABLET | Freq: Three times a day (TID) | ORAL | Status: DC | PRN

## 2022-09-18 MED ORDER — SUGAMMADEX SODIUM 200 MG/2ML IV SOLN
INTRAVENOUS | Status: DC | PRN
  Administered 2022-09-18: 200 mg via INTRAVENOUS

## 2022-09-18 MED ORDER — TRAMADOL HCL 50 MG PO TABS
50.0000 mg | ORAL_TABLET | Freq: Four times a day (QID) | ORAL | Status: DC | PRN
  Administered 2022-09-18: 50 mg via ORAL
  Filled 2022-09-18: qty 1

## 2022-09-18 MED ORDER — MORPHINE SULFATE (PF) 2 MG/ML IV SOLN: 2.0000 mg | INTRAVENOUS | Status: DC | PRN

## 2022-09-18 MED ORDER — DEXAMETHASONE SODIUM PHOSPHATE 10 MG/ML IJ SOLN
INTRAMUSCULAR | Status: DC | PRN
  Administered 2022-09-18: 10 mg via INTRAVENOUS

## 2022-09-18 MED ORDER — ENOXAPARIN SODIUM 40 MG/0.4ML IJ SOSY
40.0000 mg | PREFILLED_SYRINGE | Freq: Every day | INTRAMUSCULAR | Status: DC
  Administered 2022-09-18 – 2022-09-19 (×2): 40 mg via SUBCUTANEOUS
  Filled 2022-09-18 (×2): qty 0.4

## 2022-09-18 MED ORDER — PROPOFOL 10 MG/ML IV BOLUS
INTRAVENOUS | Status: DC | PRN
  Administered 2022-09-18: 50 mg via INTRAVENOUS

## 2022-09-18 MED ORDER — OXYCODONE HCL 5 MG/5ML PO SOLN: 5.0000 mg | Freq: Once | ORAL | Status: DC | PRN

## 2022-09-18 MED ORDER — CEFAZOLIN SODIUM-DEXTROSE 2-4 GM/100ML-% IV SOLN
2.0000 g | INTRAVENOUS | Status: AC
  Administered 2022-09-18: 2 g via INTRAVENOUS
  Filled 2022-09-18: qty 100

## 2022-09-18 MED ORDER — PHENYLEPHRINE 80 MCG/ML (10ML) SYRINGE FOR IV PUSH (FOR BLOOD PRESSURE SUPPORT)
PREFILLED_SYRINGE | INTRAVENOUS | Status: DC | PRN
  Administered 2022-09-18: 240 ug via INTRAVENOUS
  Administered 2022-09-18: 80 ug via INTRAVENOUS

## 2022-09-18 MED ORDER — LIDOCAINE 2% (20 MG/ML) 5 ML SYRINGE
INTRAMUSCULAR | Status: DC | PRN
  Administered 2022-09-18: 40 mg via INTRAVENOUS

## 2022-09-18 MED ORDER — LACTATED RINGERS IV SOLN: INTRAVENOUS | Status: DC

## 2022-09-18 MED ORDER — CEFAZOLIN SODIUM-DEXTROSE 2-4 GM/100ML-% IV SOLN
2.0000 g | Freq: Three times a day (TID) | INTRAVENOUS | Status: AC
  Administered 2022-09-18 (×2): 2 g via INTRAVENOUS
  Filled 2022-09-18 (×2): qty 100

## 2022-09-18 MED ORDER — ONDANSETRON HCL 4 MG/2ML IJ SOLN
INTRAMUSCULAR | Status: DC | PRN
  Administered 2022-09-18: 4 mg via INTRAVENOUS

## 2022-09-18 MED ORDER — HYDROMORPHONE HCL 1 MG/ML IJ SOLN: 0.2500 mg | INTRAMUSCULAR | Status: DC | PRN

## 2022-09-18 MED ORDER — METOPROLOL TARTRATE 25 MG PO TABS
25.0000 mg | ORAL_TABLET | Freq: Two times a day (BID) | ORAL | Status: DC
  Filled 2022-09-18: qty 1

## 2022-09-18 MED ORDER — ROSUVASTATIN CALCIUM 5 MG PO TABS
10.0000 mg | ORAL_TABLET | ORAL | Status: DC
  Administered 2022-09-19 – 2022-09-20 (×2): 10 mg via ORAL
  Filled 2022-09-18 (×2): qty 2

## 2022-09-18 MED ORDER — PHENYLEPHRINE 80 MCG/ML (10ML) SYRINGE FOR IV PUSH (FOR BLOOD PRESSURE SUPPORT)
PREFILLED_SYRINGE | INTRAVENOUS | Status: AC
  Filled 2022-09-18: qty 10

## 2022-09-18 MED ORDER — PHENYLEPHRINE HCL-NACL 20-0.9 MG/250ML-% IV SOLN
INTRAVENOUS | Status: DC | PRN
  Administered 2022-09-18: 25 ug/min via INTRAVENOUS

## 2022-09-18 MED ORDER — LACTATED RINGERS IV SOLN: INTRAVENOUS | Status: DC | PRN

## 2022-09-18 SURGICAL SUPPLY — 105 items
BLADE CLIPPER SURG (BLADE) IMPLANT
CANISTER SUCT 3000ML PPV (MISCELLANEOUS) ×2 IMPLANT
CANNULA REDUC XI 12-8 STAPL (CANNULA) ×2
CANNULA REDUCER 12-8 DVNC XI (CANNULA) ×2 IMPLANT
CLIP VESOCCLUDE MED 6/CT (CLIP) IMPLANT
CNTNR URN SCR LID CUP LEK RST (MISCELLANEOUS) ×5 IMPLANT
CONN ST 1/4X3/8  BEN (MISCELLANEOUS)
CONN ST 1/4X3/8 BEN (MISCELLANEOUS) IMPLANT
CONN Y 3/8X3/8X3/8  BEN (MISCELLANEOUS)
CONN Y 3/8X3/8X3/8 BEN (MISCELLANEOUS) IMPLANT
CONT SPEC 4OZ STRL OR WHT (MISCELLANEOUS) ×14
DEFOGGER SCOPE WARMER CLEARIFY (MISCELLANEOUS) ×1 IMPLANT
DERMABOND ADVANCED .7 DNX12 (GAUZE/BANDAGES/DRESSINGS) ×1 IMPLANT
DERMABOND ADVANCED .7 DNX6 (GAUZE/BANDAGES/DRESSINGS) IMPLANT
DRAIN CHANNEL 28F RND 3/8 FF (WOUND CARE) IMPLANT
DRAIN CHANNEL 32F RND 10.7 FF (WOUND CARE) IMPLANT
DRAPE ARM DVNC X/XI (DISPOSABLE) ×4 IMPLANT
DRAPE COLUMN DVNC XI (DISPOSABLE) ×1 IMPLANT
DRAPE CV SPLIT W-CLR ANES SCRN (DRAPES) ×1 IMPLANT
DRAPE DA VINCI XI ARM (DISPOSABLE) ×4
DRAPE DA VINCI XI COLUMN (DISPOSABLE) ×1
DRAPE HALF SHEET 40X57 (DRAPES) ×1 IMPLANT
DRAPE INCISE IOBAN 66X45 STRL (DRAPES) IMPLANT
DRAPE ORTHO SPLIT 77X108 STRL (DRAPES) ×1
DRAPE SURG ORHT 6 SPLT 77X108 (DRAPES) ×1 IMPLANT
ELECT BLADE 6.5 EXT (BLADE) ×1 IMPLANT
ELECT REM PT RETURN 9FT ADLT (ELECTROSURGICAL) ×1
ELECTRODE REM PT RTRN 9FT ADLT (ELECTROSURGICAL) ×1 IMPLANT
GAUZE 4X4 16PLY ~~LOC~~+RFID DBL (SPONGE) ×1 IMPLANT
GAUZE KITTNER 4X5 RF (MISCELLANEOUS) ×2 IMPLANT
GAUZE SPONGE 4X4 12PLY STRL (GAUZE/BANDAGES/DRESSINGS) ×1 IMPLANT
GAUZE SPONGE 4X4 12PLY STRL LF (GAUZE/BANDAGES/DRESSINGS) IMPLANT
GLOVE SURG MICRO LTX SZ7.5 (GLOVE) ×3 IMPLANT
GLOVE SURG POLYISO LF SZ8 (GLOVE) ×1 IMPLANT
GLOVE SURG SS PI 8.0 STRL IVOR (GLOVE) ×1 IMPLANT
GOWN STRL REUS W/ TWL LRG LVL3 (GOWN DISPOSABLE) ×2 IMPLANT
GOWN STRL REUS W/ TWL XL LVL3 (GOWN DISPOSABLE) ×2 IMPLANT
GOWN STRL REUS W/TWL 2XL LVL3 (GOWN DISPOSABLE) ×1 IMPLANT
GOWN STRL REUS W/TWL LRG LVL3 (GOWN DISPOSABLE) ×2
GOWN STRL REUS W/TWL XL LVL3 (GOWN DISPOSABLE) ×2
HEMOSTAT SURGICEL 2X14 (HEMOSTASIS) ×3 IMPLANT
IRRIGATION STRYKERFLOW (MISCELLANEOUS) ×1 IMPLANT
IRRIGATOR STRYKERFLOW (MISCELLANEOUS) ×1
KIT BASIN OR (CUSTOM PROCEDURE TRAY) ×1 IMPLANT
KIT SUCTION CATH 14FR (SUCTIONS) IMPLANT
KIT TURNOVER KIT B (KITS) ×1 IMPLANT
NDL HYPO 25GX1X1/2 BEV (NEEDLE) ×1 IMPLANT
NDL SPNL 22GX3.5 QUINCKE BK (NEEDLE) ×1 IMPLANT
NEEDLE HYPO 25GX1X1/2 BEV (NEEDLE) ×1 IMPLANT
NEEDLE SPNL 22GX3.5 QUINCKE BK (NEEDLE) ×1 IMPLANT
NS IRRIG 1000ML POUR BTL (IV SOLUTION) ×2 IMPLANT
PACK CHEST (CUSTOM PROCEDURE TRAY) ×1 IMPLANT
PAD ARMBOARD 7.5X6 YLW CONV (MISCELLANEOUS) ×2 IMPLANT
PORT ACCESS TROCAR AIRSEAL 12 (TROCAR) ×1 IMPLANT
PORT ACCESS TROCAR AIRSEAL 5M (TROCAR) ×1
RELOAD STAPLE 45 2.5 WHT DVNC (STAPLE) IMPLANT
RELOAD STAPLE 45 3.5 BLU DVNC (STAPLE) IMPLANT
RELOAD STAPLE 45 4.3 GRN DVNC (STAPLE) IMPLANT
RELOAD STAPLER 2.5X45 WHT DVNC (STAPLE) ×2 IMPLANT
RELOAD STAPLER 3.5X45 BLU DVNC (STAPLE) ×6 IMPLANT
RELOAD STAPLER 4.3X45 GRN DVNC (STAPLE) ×2 IMPLANT
SCISSORS LAP 5X35 DISP (ENDOMECHANICALS) IMPLANT
SEAL CANN UNIV 5-8 DVNC XI (MISCELLANEOUS) ×2 IMPLANT
SEAL XI 5MM-8MM UNIVERSAL (MISCELLANEOUS) ×2
SET TRI-LUMEN FLTR TB AIRSEAL (TUBING) ×1 IMPLANT
SHEARS HARMONIC HDI 20CM (ELECTROSURGICAL) IMPLANT
SOLUTION ELECTROLUBE (MISCELLANEOUS) ×1 IMPLANT
SPONGE INTESTINAL PEANUT (DISPOSABLE) IMPLANT
SPONGE T-LAP 18X18 ~~LOC~~+RFID (SPONGE) ×4 IMPLANT
SPONGE T-LAP 4X18 ~~LOC~~+RFID (SPONGE) ×1 IMPLANT
SPONGE TONSIL TAPE 1 RFD (DISPOSABLE) IMPLANT
STAPLER 45 SUREFORM CVD (STAPLE) ×1
STAPLER 45 SUREFORM CVD DVNC (STAPLE) IMPLANT
STAPLER CANNULA SEAL DVNC XI (STAPLE) ×2 IMPLANT
STAPLER CANNULA SEAL XI (STAPLE) ×2
STAPLER RELOAD 2.5X45 WHITE (STAPLE) ×2
STAPLER RELOAD 2.5X45 WHT DVNC (STAPLE) ×2
STAPLER RELOAD 3.5X45 BLU DVNC (STAPLE) ×6
STAPLER RELOAD 3.5X45 BLUE (STAPLE) ×6
STAPLER RELOAD 4.3X45 GREEN (STAPLE) ×2
STAPLER RELOAD 4.3X45 GRN DVNC (STAPLE) ×2
SUT PDS AB 3-0 SH 27 (SUTURE) IMPLANT
SUT PROLENE 4 0 RB 1 (SUTURE)
SUT PROLENE 4-0 RB1 .5 CRCL 36 (SUTURE) IMPLANT
SUT SILK  1 MH (SUTURE) ×1
SUT SILK 1 MH (SUTURE) ×1 IMPLANT
SUT SILK 1 TIES 10X30 (SUTURE) ×1 IMPLANT
SUT SILK 2 0 SH (SUTURE) ×1 IMPLANT
SUT SILK 2 0SH CR/8 30 (SUTURE) IMPLANT
SUT SILK 3 0SH CR/8 30 (SUTURE) IMPLANT
SUT VIC AB 1 CTX 36 (SUTURE) ×1
SUT VIC AB 1 CTX36XBRD ANBCTR (SUTURE) ×1 IMPLANT
SUT VIC AB 2-0 CTX 36 (SUTURE) ×1 IMPLANT
SUT VIC AB 3-0 MH 27 (SUTURE) IMPLANT
SUT VIC AB 3-0 X1 27 (SUTURE) ×2 IMPLANT
SUT VICRYL 0 TIES 12 18 (SUTURE) ×1 IMPLANT
SUT VICRYL 0 UR6 27IN ABS (SUTURE) ×2 IMPLANT
SUT VICRYL 2 TP 1 (SUTURE) IMPLANT
SYR 20CC LL (SYRINGE) ×2 IMPLANT
SYSTEM RETRIEVAL ANCHOR 15 (MISCELLANEOUS) IMPLANT
SYSTEM SAHARA CHEST DRAIN ATS (WOUND CARE) ×1 IMPLANT
TAPE CLOTH 4X10 WHT NS (GAUZE/BANDAGES/DRESSINGS) ×1 IMPLANT
TOWEL GREEN STERILE (TOWEL DISPOSABLE) ×1 IMPLANT
TRAY FOLEY MTR SLVR 16FR STAT (SET/KITS/TRAYS/PACK) ×1 IMPLANT
WATER STERILE IRR 1000ML POUR (IV SOLUTION) ×2 IMPLANT

## 2022-09-18 NOTE — H&P (Signed)
PCP is Rusty Aus, MD Referring Provider is Lloyd Huger, MD       Chief Complaint  Patient presents with   Lung Cancer      New patient consultation, chest CT 6/22, PFTs 6/28, Bronch/bx 7/7, PET 7/18, MR Brain 7/19      HPI: Anthony Morrison is sent for consultation regarding a left lower lobe squamous cell carcinoma.  Anthony Morrison is an 81 year old man with a past medical history significant for tobacco abuse, COPD, CAD, CABG x 3 in 2016 ([facility]), hypertension, hyperlipidemia, extracranial carotid disease, COVID-19, tibial vein DVT, stage III chronic kidney disease, pneumonia, paroxysmal SVT, and umbilical hernia.  Anthony Morrison smoked a pack a day for about 50 years prior to quitting in 2016.  Anthony Morrison had coronary bypass grafting x 3 by Dr. Servando Snare in 2016.  Anthony Morrison was being evaluated for shortness of breath with exertion.  Anthony Morrison would get winded with walking about one half block on level ground.  Complained of a sensation of tightness in his chest but no pressure or chest pain.  His workup included chest x-ray which showed a left lower lobe cavitary lung nodule.  That led to a CT which confirmed a 4.3 cm cavitary left lower lobe lung nodule with a borderline hilar node.  On PET/CT the mass was markedly hypermetabolic but the node was equivocal.  Anthony Morrison underwent bronchoscopy and endobronchial ultrasound by Dr. Lanney Gins.  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  Aspirations from lymph node showed lymphoid cells and a few atypical cells but no evidence of malignancy.  Anthony Morrison saw Dr. Grayland Ormond and was referred for consideration for surgical resection.  Anthony Morrison still gets short of breath with exertion.  Anthony Morrison gives a vague history and is not sure if Anthony Morrison had similar symptoms prior to his CABG.  However, his son who is with him says that Anthony Morrison did have similar symptoms before his bypass surgery.  Anthony Morrison has not had any change in appetite but has lost about 5 pounds.  Anthony Morrison does complain of some decreased energy.  Anthony Morrison has coughed  up some blood as well but not recently.   Anthony Score: At the time of surgery this patient's most appropriate activity status/level should be described as: []     0    Normal activity, no symptoms [x]     1    Restricted in physical strenuous activity but ambulatory, able to do out light work []     2    Ambulatory and capable of self care, unable to do work activities, up and about >50 % of waking hours                              []     3    Only limited self care, in bed greater than 50% of waking hours []     4    Completely disabled, no self care, confined to bed or chair []     5    Moribund           Past Medical History:  Diagnosis Date   Acute deep vein thrombosis (DVT) of tibial vein of left lower extremity (HCC)     Acute renal failure superimposed on stage 3 chronic kidney disease ([place])     Arthritis     ASCVD (arteriosclerotic cardiovascular disease)     Bronchitis     Cancer ([place])      SKIN CANCERS  Carotid stenosis      bilateral-100%blockage on right carotid   COPD (chronic obstructive pulmonary disease) (HCC)      Panlobular emphysema   Coronary artery disease     COVID-19 2020   Diverticulitis     Dyspnea      with exertion   Elevated d-dimer     Elevated troponin     Hemorrhoids     Hyperlipidemia     Hypertension     PAD (peripheral artery disease) ([place])     Pneumonia 2016   PSVT (paroxysmal supraventricular tachycardia) (HCC)     S/P CABG x 3 2019   Sepsis (HCC)     Stable angina (HCC)     Tachycardia     Umbilical hernia     Upper GI bleed     Vitamin B12 deficiency             Past Surgical History:  Procedure Laterality Date   COLONOSCOPY   2015   CORONARY ARTERY BYPASS GRAFT N/A 01/14/2019    Procedure: Emergency Coronary Artery Bypass Grafting (CABG)  times three using left internal mammary artery to LAD, reversed saphenous vein graft to OM and RCA;  Surgeon: Grace Isaac, MD;  Location: [facility];  Service: Open Heart Surgery;   Laterality: N/A;   ENDOVEIN HARVEST OF GREATER SAPHENOUS VEIN Right 01/14/2019    Procedure: ENDOVEIN HARVEST OF GREATER SAPHENOUS VEIN;  Surgeon: Grace Isaac, MD;  Location: [facility];  Service: Open Heart Surgery;  Laterality: Right;   EYE SURGERY        CATARACTS BIL   LEFT HEART CATH AND CORONARY ANGIOGRAPHY Left 01/14/2019    Procedure: LEFT HEART CATH AND CORONARY ANGIOGRAPHY;  Surgeon: Corey Skains, MD;  Location: [facility];  Service: Cardiovascular;  Laterality: Left;   TEE WITHOUT CARDIOVERSION N/A 01/14/2019    Procedure: TRANSESOPHAGEAL ECHOCARDIOGRAM (TEE);  Surgeon: Grace Isaac, MD;  Location: [facility];  Service: Open Heart Surgery;  Laterality: N/A;   VIDEO BRONCHOSCOPY WITH ENDOBRONCHIAL NAVIGATION N/A 06/27/2022    Procedure: VIDEO BRONCHOSCOPY WITH ENDOBRONCHIAL NAVIGATION;  Surgeon: Ottie Glazier, MD;  Location: [facility];  Service: Thoracic;  Laterality: N/A;   VIDEO BRONCHOSCOPY WITH ENDOBRONCHIAL ULTRASOUND N/A 06/27/2022    Procedure: VIDEO BRONCHOSCOPY WITH ENDOBRONCHIAL ULTRASOUND;  Surgeon: Ottie Glazier, MD;  Location: [facility];  Service: Thoracic;  Laterality: N/A;           Family History  Problem Relation Age of Onset   Cancer Mother     Cancer Sister          Metastatic Lung Cancer      Social History Social History         Tobacco Use   Smoking status: Former      Packs/day: 1.00      Years: 50.00      Total pack years: 50.00      Types: Cigarettes      Quit date: 01/06/2015      Years since quitting: 7.5   Smokeless tobacco: Never  Vaping Use   Vaping Use: Never used  Substance Use Topics   Alcohol use: Yes      Comment: OCC    Drug use: Never            Current Outpatient Medications  Medication Sig Dispense Refill   Cyanocobalamin (VITAMIN B-12) 5000 MCG SUBL Place 1 tablet under the tongue once a week. Saturdays  Fluticasone-Umeclidin-Vilant (TRELEGY ELLIPTA) 100-62.5-25 MCG/ACT AEPB Inhale 1 puff into the lungs  daily before lunch.       metoprolol tartrate (LOPRESSOR) 50 MG tablet Take 50 mg by mouth as needed (Tachycardia).       rosuvastatin (CRESTOR) 10 MG tablet Take 10 mg by mouth every morning.        No current facility-administered medications for this visit.           Allergies  Allergen Reactions   Prednisone Itching   Sulfa Antibiotics Other (See Comments)      Unknown      Review of Systems  Constitutional:  Positive for fatigue.  HENT:  Positive for dental problem (Dentures). Negative for trouble swallowing and voice change.   Respiratory:  Positive for cough, chest tightness and shortness of breath. Negative for wheezing.   Cardiovascular:  Positive for leg swelling. Negative for chest pain.  Gastrointestinal:  Positive for constipation.  Genitourinary:  Positive for frequency.  Neurological:  Positive for numbness. Negative for seizures, syncope and weakness.  Hematological:  Negative for adenopathy. Bruises/bleeds easily.  Psychiatric/Behavioral:  The patient is nervous/anxious.       BP 139/67 (BP Location: Left Arm, Patient Position: Sitting, Cuff Size: Normal)   Pulse 67   Resp 20   Ht 5\' 6"  (1.676 m)   Wt 150 lb 1.6 oz (68.1 kg)   SpO2 96% Comment: RA  BMI 24.23 kg/m  Physical Exam Vitals reviewed.  Constitutional:      General: Anthony Morrison is not in acute distress.    Appearance: Normal appearance.  HENT:     Head: Normocephalic and atraumatic.  Eyes:     General: No scleral icterus.    Extraocular Movements: Extraocular movements intact.  Neck:     Vascular: Carotid bruit present.  Cardiovascular:     Rate and Rhythm: Normal rate and regular rhythm.     Heart sounds: Normal heart sounds. No murmur heard. Pulmonary:     Effort: Pulmonary effort is normal. No respiratory distress.     Breath sounds: Normal breath sounds. No wheezing or rales.  Abdominal:     General: There is no distension.     Palpations: Abdomen is soft.  Lymphadenopathy:     Cervical:  No cervical adenopathy.  Skin:    General: Skin is warm and dry.  Neurological:     General: No focal deficit present.     Mental Status: Anthony Morrison is alert and oriented to person, place, and time.     Cranial Nerves: No cranial nerve deficit.     Motor: No weakness.      Diagnostic Tests: NUCLEAR MEDICINE PET SKULL BASE TO THIGH   TECHNIQUE: A 0.5 mCi F-18 FDG was injected intravenously. Full-ring PET imaging was performed from the skull base to thigh after the radiotracer. CT data was obtained and used for attenuation correction and anatomic localization.   Fasting blood glucose: 90 mg/dl   COMPARISON:  Chest CT 06/12/2022   FINDINGS: Mediastinal blood pool activity: SUV max 1.8   Liver activity: SUV max NA   NECK: No hypermetabolic lymph nodes in the neck.   Incidental CT findings: none   CHEST: 4.1 cm cavitary lesion posterior left lower lobe is markedly hypermetabolic with SUV max = 22.2.   11 mm short axis left hilar lymph node just cranial to the left inferior pulmonary vein on image 101/2 shows low level hypermetabolism with SUV max = 3.2.   No other hypermetabolic lymphadenopathy  in the hilar regions or mediastinum. No other unexpected soft tissue hypermetabolism in the thorax.   Incidental CT findings: Heart size normal. Mitral annular calcification noted. Coronary artery calcification is evident. Status post CABG. Centrilobular emphsyema noted. Tiny left pleural effusion.   ABDOMEN/PELVIS: No abnormal hypermetabolic activity within the liver, pancreas, adrenal glands, or spleen. No hypermetabolic lymph nodes in the abdomen or pelvis.   Incidental CT findings: 14 mm cyst noted lower pole right kidney with additional small cysts in the interpolar region and upper pole of the right kidney. Left kidney unremarkable. There is moderate atherosclerotic calcification of the abdominal aorta without aneurysm. Left colonic diverticulosis without diverticulitis.    SKELETON: No focal hypermetabolic activity to suggest skeletal metastasis.   Incidental CT findings: none   IMPRESSION: 1. 4.1 cm left lower lobe pulmonary mass is markedly hypermetabolic, consistent with patient's history of known primary neoplasm. 2. The low left hilar node identified on previous CT shows low level hypermetabolism on PET imaging today, suspicious for metastatic involvement. 3. No evidence for distant metastatic disease in the neck, abdomen, or pelvis. 4. Left colonic diverticulosis. 5.  Aortic Atherosclerois (ICD10-170.0) 6.  Emphysema. (MWN02-V25.9)     Electronically Signed   By: Misty Stanley M.D.   On: 07/09/2022 05:12 I personally reviewed the CT and PET/CT images.  There is a 4.1 cm cavitary left lower lobe lung mass highly suspicious for a primary bronchogenic carcinoma.  Some activity in the left hilar node as well although lower grade.  Some emphysema changes and also aortic atherosclerosis.  No evidence of regional or distant metastatic disease.   Pulmonary function testing 05/2022 FVC 2.41 (72%) FEV1 1.57 (69%)     Impression: Earlin Sweeden is an 81 year old man with a past medical history significant for tobacco abuse, COPD, CAD, CABG x 3 in 2016 ([facility]), hypertension, hyperlipidemia, extracranial carotid disease, COVID-19, tibial vein DVT, stage III chronic kidney disease, pneumonia, paroxysmal SVT, and umbilical hernia.  Anthony Morrison smoked a pack a day for about 50 years prior to quitting in 2016.    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  Clinical stage T2bN0, stage IIa versus T2bN1, stage IIb.    COPD-FVC and FEV1 both about 70% of predicted.  Dyspnea is out of proportion to that degree of COPD.  Will need full PFTs with and without bronchodilators and with diffusion capacity to further assess.  CAD-given his history of CAD without any real convincing anginal symptoms prior to that, I am very  concerned that some of his dyspnea could be cardiac in nature.  His son said Anthony Morrison had similar complaints prior to having his bypass surgery.  Those then resolved for several years but then Anthony Morrison started having them again.  I think Anthony Morrison should be seen by cardiology for clearance prior to surgery.   History of DVT-in the setting of prolonged hospitalization with COVID-19.  Not currently on any blood thinners.  I discussed in detail with Mr. Stanbery and his son the issues regarding treatment of his lung cancer.  Options include chemoradiation versus surgical resection and likely adjuvant chemotherapy depending on the final pathologic staging.  We discussed issues regarding the left hilar node.  Normally that would be easily accessible with surgery however in the postoperative setting after coronary bypass grafting or lymph node dissection could be more limited than normal.  Will all depend on the degree of adhesions and scarring.  I offered him the option of surgical resection (pending  cardiology clearance).  I discussed the general nature of the surgery with Mr. Yearby and his son.  They are aware of the need for general anesthesia, the incisions to be used, the use of the surgical robot, the use of a drainage tube postoperatively, the expected hospital stay, and the overall recovery.  I informed him of the indications, risks, benefits, and alternatives.  They understand the risks include, but are not limited to death, MI, DVT, PE, bleeding, possible need for transfusion, infection, cardiac arrhythmias, prolonged air leaks, as well as the possibility of other unforeseeable complications.   Anthony Morrison is at higher risk than normal for MI and DVT and PE due to his history.   Plan: Anthony Morrison needs cardiology clearance prior to surgery Full PFTs with and without bronchodilators and with diffusion capacity Will schedule for robotic left lower lobectomy once cleared by cardiology.   Melrose Nakayama, MD [facility] [phone]   Cleared by Cardiology No interval events or change in exam  Remo Lipps C. Roxan Hockey, MD [facility] [phone]

## 2022-09-18 NOTE — Plan of Care (Signed)
  Problem: Education: Goal: Knowledge of disease or condition will improve Outcome: Progressing Goal: Knowledge of the prescribed therapeutic regimen will improve Outcome: Progressing   Problem: Activity: Goal: Risk for activity intolerance will decrease Outcome: Progressing   Problem: Cardiac: Goal: Will achieve and/or maintain hemodynamic stability Outcome: Progressing   Problem: Clinical Measurements: Goal: Postoperative complications will be avoided or minimized Outcome: Progressing   Problem: Respiratory: Goal: Respiratory status will improve Outcome: Progressing   Problem: Pain Management: Goal: Pain level will decrease Outcome: Progressing   Problem: Skin Integrity: Goal: Wound healing without signs and symptoms infection will improve Outcome: Progressing   Problem: Education: Goal: Knowledge of General Education information will improve Description: Including pain rating scale, medication(s)/side effects and non-pharmacologic comfort measures Outcome: Progressing   Problem: Clinical Measurements: Goal: Ability to maintain clinical measurements within normal limits will improve Outcome: Progressing Goal: Will remain free from infection Outcome: Progressing Goal: Diagnostic test results will improve Outcome: Progressing Goal: Respiratory complications will improve Outcome: Progressing Goal: Cardiovascular complication will be avoided Outcome: Progressing   Problem: Nutrition: Goal: Adequate nutrition will be maintained Outcome: Progressing

## 2022-09-18 NOTE — Transfer of Care (Signed)
Immediate Anesthesia Transfer of Care Note  Patient: Anthony Morrison  Procedure(s) Performed: XI ROBOTIC ASSISTED THORACOSCOPY-LEFT LOWER LOBECTOMY (Left: Chest) LYMPH NODE DISSECTION (Left: Chest) INTERCOSTAL NERVE BLOCK (Left: Chest)  Patient Location: PACU  Anesthesia Type:General  Level of Consciousness: lethargic  Airway & Oxygen Therapy: Patient Spontanous Breathing  Post-op Assessment: Report given to RN  Post vital signs: Reviewed and stable  Last Vitals:  Vitals Value Taken Time  BP 139/75 09/18/22 1131  Temp    Pulse 69 09/18/22 1132  Resp    SpO2 97 % 09/18/22 1132  Vitals shown include unvalidated device data.  Last Pain:  Vitals:   09/18/22 3818  PainSc: 0-No pain         Complications: No notable events documented.

## 2022-09-18 NOTE — Hospital Course (Addendum)
History of Present Illness: Anthony Morrison is an 81 year old man with a past medical history significant for tobacco abuse, COPD, CAD, CABG x 3 in 2016 ([facility]), hypertension, hyperlipidemia, extracranial carotid disease, COVID-19, tibial vein DVT, stage III chronic kidney disease, pneumonia, paroxysmal SVT, and umbilical hernia.  He smoked a pack a day for about 50 years prior to quitting in 2016.    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  Clinical stage T2bN0, stage IIa versus T2bN1, stage IIb.    COPD-FVC and FEV1 both about 70% of predicted.  Dyspnea is out of proportion to that degree of COPD.  Will need full PFTs with and without bronchodilators and with diffusion capacity to further assess.  CAD-given Anthony history of CAD without any real convincing anginal symptoms prior to that, I am very concerned that some of Anthony dyspnea could be cardiac in nature.  Anthony Morrison said he had similar complaints prior to having Anthony bypass surgery.  Those then resolved for several years but then he started having them again.  I think he should be seen by cardiology for clearance prior to surgery.   History of DVT-in the setting of prolonged hospitalization with COVID-19.  Not currently on any blood thinners.  I discussed in detail with Anthony Morrison and Anthony Morrison the issues regarding treatment of Anthony lung cancer.  Options include chemoradiation versus surgical resection and likely adjuvant chemotherapy depending on the final pathologic staging.  We discussed issues regarding the left hilar node.  Normally that would be easily accessible with surgery however in the postoperative setting after coronary bypass grafting or lymph node dissection could be more limited than normal.  Will all depend on the degree of adhesions and scarring.  I offered him the option of surgical resection (pending cardiology clearance).  I discussed the general nature of the surgery with Mr.  Morrison and Anthony Morrison.  They are aware of the need for general anesthesia, the incisions to be used, the use of the surgical robot, the use of a drainage tube postoperatively, the expected hospital stay, and the overall recovery.  I informed him of the indications, risks, benefits, and alternatives.  They understand the risks include, but are not limited to death, MI, DVT, PE, bleeding, possible need for transfusion, infection, cardiac arrhythmias, prolonged air leaks, as well as the possibility of other unforeseeable complications.   He is at higher risk than normal for MI and DVT and PE due to Anthony history.  Hospital Course:  After cardiac clearance,  Anthony Morrison was admitted for elective surgery on 09/18/22 and taken to the OR where robotic-assisted left lower lobectomy was performed without complication. Mediastinal lymph node dissection was also done. Following the procedure, he was extubated and recovered in the PACU and later transferred to [place]. Follow up CXR showed the left chest tube in satisfactory position and no pneumothorax. Anthony Morrison was removed on post op day one and IVF were decreased. He had mildly elevated creatinine post op. On 09/30, creatinine was decreased to **. Chest tube had minor output, there was no air leak so chest tube was removed on 09/29. Same day chest x ray showed **. He is ambulating on room air with good oxygenation. All wounds are clean, dry, healing without signs of infection. He is tolerating a diet. PA/LAT CXR done 09/30 showed **. He is surgically stable for discharge today.

## 2022-09-18 NOTE — Anesthesia Procedure Notes (Signed)
Arterial Line Insertion Start/End9/28/2023 7:10 AM, 09/18/2022 7:15 AM Performed by: Barrington Ellison, CRNA  Patient location: Pre-op. Preanesthetic checklist: patient identified, risks and benefits discussed, surgical consent and pre-op evaluation Lidocaine 1% used for infiltration Left, radial was placed Catheter size: 20 G Hand hygiene performed  and maximum sterile barriers used  Allen's test indicative of satisfactory collateral circulation Attempts: 1 Procedure performed without using ultrasound guided technique. Following insertion, dressing applied and Biopatch. Post procedure assessment: normal and unchanged  Patient tolerated the procedure well with no immediate complications.

## 2022-09-18 NOTE — Discharge Instructions (Signed)
Robot-Assisted Thoracic Surgery, Care After The following information offers guidance on how to care for yourself after your procedure. Your health care provider may also give you more specific instructions. If you have problems or questions, contact your health care provider. What can I expect after the procedure? After the procedure, it is common to have: Some pain and aches in the area of your surgical incisions. Pain when breathing in (inhaling) and coughing. Tiredness (fatigue). Trouble sleeping. Constipation. Follow these instructions at home: Medicines Take over-the-counter and prescription medicines only as told by your health care provider. If you were prescribed an antibiotic medicine, take it as told by your health care provider. Do not stop taking the antibiotic even if you start to feel better. Talk with your health care provider about safe and effective ways to manage pain after your procedure. Pain management should fit your specific health needs. Take pain medicine before pain becomes severe. Relieving and controlling your pain will make breathing easier for you. Ask your health care provider if the medicine prescribed to you requires you to avoid driving or using machinery. Eating and drinking Follow instructions from your health care provider about eating or drinking restrictions. These will vary depending on what procedure you had. Your health care provider may recommend: A liquid diet or soft diet for the first few days. Meals that are smaller and more frequent. A diet of fruits, vegetables, whole grains, and low-fat proteins. Limiting foods that are high in fat and processed sugar, including fried or sweet foods. Incision care Follow instructions from your health care provider about how to take care of your incisions. Make sure you: Wash your hands with soap and water for at least 20 seconds before and after you change your bandage (dressing). If soap and water are not  available, use hand sanitizer. Change your dressing as told by your health care provider. Leave stitches (sutures), skin glue, or adhesive strips in place. These skin closures may need to stay in place for 2 weeks or longer. If adhesive strip edges start to loosen and curl up, you may trim the loose edges. Do not remove adhesive strips completely unless your health care provider tells you to do that. Check your incision area every day for signs of infection. Check for: Redness, swelling, or more pain. Fluid or blood. Warmth. Pus or a bad smell. Activity Return to your normal activities as told by your health care provider. Ask your health care provider what activities are safe for you. Ask your health care provider when it is safe for you to drive. Do not lift anything that is heavier than 10 lb (4.5 kg), or the limit that you are told, until your health care provider says that it is safe. Rest as told by your health care provider. Avoid sitting for a long time without moving. Get up to take short walks every 1-2 hours. This is important to improve blood flow and breathing. Ask for help if you feel weak or unsteady. Do exercises as told by your health care provider. Pneumonia prevention  Do deep breathing exercises and cough regularly as directed. This helps clear mucus and opens your lungs. Doing this helps prevent lung infection (pneumonia). If you were given an incentive spirometer, use it as told. An incentive spirometer is a tool that measures how well you are filling your lungs with each breath. Coughing may hurt less if you try to support your chest. This is called splinting. Try one of these when you  cough: Hold a pillow against your chest. Place the palms of both hands on top of your incision area. Do not use any products that contain nicotine or tobacco. These products include cigarettes, chewing tobacco, and vaping devices, such as e-cigarettes. If you need help quitting, ask your  health care provider. Avoid secondhand smoke. General instructions If you have a drainage tube: Follow instructions from your health care provider about how to take care of it. Do not travel by airplane after your tube is removed until your health care provider tells you it is safe. You may need to take these actions to prevent or treat constipation: Drink enough fluid to keep your urine pale yellow. Take over-the-counter or prescription medicines. Eat foods that are high in fiber, such as beans, whole grains, and fresh fruits and vegetables. Limit foods that are high in fat and processed sugars, such as fried or sweet foods. Keep all follow-up visits. This is important. Contact a health care provider if: You have redness, swelling, or more pain around an incision. You have fluid or blood coming from an incision. An incision feels warm to the touch. You have pus or a bad smell coming from an incision. You have a fever. You cannot eat or drink without vomiting. Your pain medicine is not controlling your pain. Get help right away if: You have chest pain. Your heart is beating quickly. You have trouble breathing. You have trouble speaking. You are confused. You feel weak or dizzy, or you faint. These symptoms may represent a serious problem that is an emergency. Do not wait to see if the symptoms will go away. Get medical help right away. Call your local emergency services (911 in the [place] Do not drive yourself to the hospital. Summary Talk with your health care provider about safe and effective ways to manage pain after your procedure. Pain management should fit your specific health needs. Return to your normal activities as told by your health care provider. Ask your health care provider what activities are safe for you. Do deep breathing exercises and cough regularly as directed. This helps to clear mucus and prevent pneumonia. If it hurts to cough, ease pain by holding a pillow  against your chest or by placing the palms of both hands over your incisions. This information is not intended to replace advice given to you by your health care provider. Make sure you discuss any questions you have with your health care provider. Document Revised: 08/31/2020 Document Reviewed: 08/31/2020 Elsevier Patient Education  [employer].

## 2022-09-18 NOTE — Anesthesia Postprocedure Evaluation (Signed)
Anesthesia Post Note  Patient: Anthony Morrison  Procedure(s) Performed: XI ROBOTIC ASSISTED THORACOSCOPY-LEFT LOWER LOBECTOMY (Left: Chest) LYMPH NODE DISSECTION (Left: Chest) INTERCOSTAL NERVE BLOCK (Left: Chest)     Patient location during evaluation: PACU Anesthesia Type: General Level of consciousness: awake and alert, patient cooperative and oriented Pain management: pain level controlled Vital Signs Assessment: post-procedure vital signs reviewed and stable Respiratory status: spontaneous breathing, nonlabored ventilation, respiratory function stable and patient connected to nasal cannula oxygen Cardiovascular status: blood pressure returned to baseline and stable Postop Assessment: no apparent nausea or vomiting Anesthetic complications: no   No notable events documented.  Last Vitals:  Vitals:   09/18/22 1200 09/18/22 1215  BP: 138/80 136/83  Pulse: 73 83  Resp: 13 (!) 22  Temp:    SpO2: 97% 95%    Last Pain:  Vitals:   09/18/22 1130  PainSc: 0-No pain                 Aneita Kiger,E. Jerricka Carvey

## 2022-09-18 NOTE — Anesthesia Procedure Notes (Signed)
Procedure Name: Intubation Date/Time: 09/18/2022 8:16 AM  Performed by: Barrington Ellison, CRNAPre-anesthesia Checklist: Patient identified, Emergency Drugs available, Suction available and Patient being monitored Patient Re-evaluated:Patient Re-evaluated prior to induction Oxygen Delivery Method: Circle System Utilized Preoxygenation: Pre-oxygenation with 100% oxygen Induction Type: IV induction Ventilation: Mask ventilation without difficulty Laryngoscope Size: Mac and 4 Grade View: Grade I Tube type: Oral Endobronchial tube: Left and Double lumen EBT and 37 Fr Number of attempts: 1 Airway Equipment and Method: Stylet and Oral airway Placement Confirmation: ETT inserted through vocal cords under direct vision, positive ETCO2 and breath sounds checked- equal and bilateral Secured at: 30 cm Tube secured with: Tape Dental Injury: Teeth and Oropharynx as per pre-operative assessment

## 2022-09-18 NOTE — Brief Op Note (Addendum)
09/18/2022  11:18 AM  PATIENT:  Anthony Morrison  81 y.o. 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,N0)  POST-OPERATIVE DIAGNOSIS:  SQUAMOUS CELL CARCINOMA LEFT LOWER LOBE LUNG-CLINICAL STAGE IIA(T2b,N0)  PROCEDURE:  XI ROBOTIC ASSISTED LEFT LOWER LOBECTOMY LYMPH NODE DISSECTION  INTERCOSTAL NERVE BLOCK (Left)  SURGEON:  Melrose Nakayama, MD - Primary  PHYSICIAN ASSISTANT: Enid Cutter, PA-C  ASSISTANTS: Angelito, Daisy Jacinto Reap, RN, Scrub Person   ANESTHESIA:   general  EBL:  20 mL   BLOOD ADMINISTERED:none  DRAINS:  Left pleural 28 Blake Drain    LOCAL MEDICATIONS USED:  Exparel  SPECIMEN:     Left lower lung lobe with multiple lymph nodes  DISPOSITION OF SPECIMEN:  PATHOLOGY  COUNTS:  Correct  DICTATION: .Dragon Dictation  PLAN OF CARE: Admit to inpatient   PATIENT DISPOSITION:  PACU - hemodynamically stable.   Delay start of Pharmacological VTE agent (>24hrs) due to surgical blood loss or risk of bleeding: no

## 2022-09-18 NOTE — Interval H&P Note (Signed)
History and Physical Interval Note:  09/18/2022 7:52 AM  Anthony Morrison  has presented today for surgery, with the diagnosis of LLL SQUAMOUS CELL CARCINOMA.  The various methods of treatment have been discussed with the patient and family. After consideration of risks, benefits and other options for treatment, the patient has consented to  Procedure(s): XI ROBOTIC ASSISTED THORACOSCOPY-LEFT LOWER LOBECTOMY (Left) as a surgical intervention.  The patient's history has been reviewed, patient examined, no change in status, stable for surgery.  I have reviewed the patient's chart and labs.  Questions were answered to the patient's satisfaction.     Melrose Nakayama

## 2022-09-18 NOTE — Discharge Summary (Addendum)
Physician Discharge Summary       [phone]       [place] [address]             [id number]    Patient ID: Anthony Morrison MRN: [id number] DOB/AGE: 81-Oct-1942 81 y.o.  Admit date: 09/18/2022 Discharge date: 09/23/2022  Admission Diagnoses: Left lower lobe squamous cell carcinoma- Clinical stage IIA (T2b,N0) Discharge Diagnoses:  Squamous cell carcinoma left lower lobe- pathologic stage IIB (T3,N0) Xi assisted left thoracoscopy, LLL, LN dissection, and intercostal nerve block (ribs 3 through 10) 3. History of the following: Acute deep vein thrombosis (DVT) of tibial vein of left lower extremity (HCC)      Acute renal failure superimposed on stage 3 chronic kidney disease (HCC)     Arthritis     ASCVD (arteriosclerotic cardiovascular disease)     Bronchitis     Cancer (HCC)      SKIN CANCERS   Carotid stenosis      bilateral-100%blockage on right carotid   COPD (chronic obstructive pulmonary disease) ([facility])      Panlobular emphysema   Coronary artery disease     COVID-19 2020   Diverticulitis     Dyspnea      with exertion   Elevated d-dimer     Elevated troponin     Hemorrhoids     Hyperlipidemia     Hypertension     PAD (peripheral artery disease) ([facility])     Pneumonia 2016   PSVT (paroxysmal supraventricular tachycardia) (HCC)     S/P CABG x 3 2019   Sepsis (HCC)     Stable angina (HCC)     Tachycardia     Umbilical hernia     Upper GI bleed     Vitamin B12 deficiency   History of Present Illness: Anthony Morrison is an 81 year old man with a past medical history significant for tobacco abuse, COPD, CAD, CABG x 3 in 2016 ([facility]), hypertension, hyperlipidemia, extracranial carotid disease, COVID-19, tibial vein DVT, stage III chronic kidney disease, pneumonia, paroxysmal SVT, and umbilical hernia.  He smoked a pack a day for about 50 years prior to quitting in 2016.    Left lower lobe lung mass-biopsy-proven non-small cell carcinoma likely squamous  cell carcinoma.  Clinical stage T2bN0, stage IIa versus T2bN1, stage IIb.    COPD-FVC and FEV1 both about 70% of predicted.  Dyspnea is out of proportion to that degree of COPD.  Will need full PFTs with and without bronchodilators and with diffusion capacity to further assess.  CAD-given his history of CAD without any real convincing anginal symptoms prior to that, I am very concerned that some of his dyspnea could be cardiac in nature.  His son said he had similar complaints prior to having his bypass surgery.  Those then resolved for several years but then he started having them again.  I think he should be seen by cardiology for clearance prior to surgery.   History of DVT-in the setting of prolonged hospitalization with COVID-19.  Not currently on any blood thinners.  I discussed in detail with Anthony Morrison and his son the issues regarding treatment of his lung cancer.  Options include chemoradiation versus surgical resection and likely adjuvant chemotherapy depending on the final pathologic staging.  We discussed issues regarding the left hilar node.  Normally that would be easily accessible with surgery however in the postoperative setting after coronary bypass grafting or lymph node dissection could be more limited than normal.  Will all depend on the degree of adhesions and scarring.  I offered him the option of surgical resection (pending cardiology clearance).  I discussed the general nature of the surgery with Anthony Morrison and his son.  They are aware of the need for general anesthesia, the incisions to be used, the use of the surgical robot, the use of a drainage tube postoperatively, the expected hospital stay, and the overall recovery.  I informed him of the indications, risks, benefits, and alternatives.  They understand the risks include, but are not limited to death, MI, DVT, PE, bleeding, possible need for transfusion, infection, cardiac arrhythmias, prolonged air leaks, as well as the  possibility of other unforeseeable complications.   He is at higher risk than normal for MI and DVT and PE due to his history.  Hospital Course:  After cardiac clearance,  Anthony Morrison was admitted for elective surgery on 09/18/22 and taken to the OR where robotic-assisted left lower lobectomy was performed without complication. Mediastinal lymph node dissection was also done. Following the procedure, he was extubated and recovered in the PACU and later transferred to [facility]. Follow up CXR showed the left chest tube in satisfactory position and no pneumothorax. Leslye Peer was removed on post op day one and IVF were decreased. He had mildly elevated creatinine post op. On 09/30, creatinine was decreased to 1.25.  He has a very minor expected acute blood loss anemia which is stable.  Chest tube had minor output, there was no air leak so chest tube was removed on 09/29. Same day chest x ray showed small left apical pneumothorax without change from previous study.  He is ambulating on room air with good oxygenation. All wounds are clean, dry, healing without signs of infection. He is tolerating a diet. He is surgically stable for discharge today.  Consults: None  Procedure (s):  Xi robotic-assisted left lower lobectomy, lymph node dissection, intercostal nerve blocks levels 3 through 10 by Dr. Roxan Hockey on 09/18/2022.  Pathology:pending   Latest Vital Signs: Blood pressure (!) 143/80, pulse 68, temperature 97.8 F (36.6 C), temperature source Oral, resp. rate 13, height 5\' 6"  (1.676 m), weight 66.7 kg, SpO2 93 %.  Physical Exam:General appearance: alert, cooperative, and no distress Heart: regular rate and rhythm Lungs: dim left base Abdomen: benign Extremities: no edema or calf tenderness Wound: incis healing well  Discharge Condition:Stable and discharged to home.  Recent laboratory studies:  Lab Results  Component Value Date   WBC 12.4 (H) 09/20/2022   HGB 12.3 (L) 09/20/2022    HCT 37.9 (L) 09/20/2022   MCV 90.0 09/20/2022   PLT 287 09/20/2022   Lab Results  Component Value Date   NA 141 09/20/2022   K 3.7 09/20/2022   CL 109 09/20/2022   CO2 23 09/20/2022   CREATININE 1.25 (H) 09/20/2022   GLUCOSE 98 09/20/2022      Diagnostic Studies: DG Chest 2 View  Result Date: 09/20/2022 CLINICAL DATA:  Postop.  Lung nodule surgery. EXAM: CHEST - 2 VIEW COMPARISON:  09/19/2022 and older studies. FINDINGS: Small left apical pneumothorax. Increased opacity at the left lung base now obscures hemidiaphragm likely a combination of pleural fluid and atelectasis. Mild atelectasis at the medial right lung base. Remainder of the lungs is clear. Stable changes from CABG surgery. Cardiac silhouette normal in size. Left lateral chest wall subcutaneous emphysema, stable. IMPRESSION: 1. Small left apical pneumothorax without change from the previous day's exam allowing for differences in patient positioning. 2.  Increased opacity at the left lung base likely a combination of pleural fluid and atelectasis. 3. No other change. Electronically Signed   By: Lajean Manes M.D.   On: 09/20/2022 08:00   DG Chest 1V REPEAT Same Day  Result Date: 09/19/2022 CLINICAL DATA:  Chest tube removal. EXAM: CHEST - 1 VIEW SAME DAY COMPARISON:  Chest x-ray from same day. FINDINGS: Interval removal of the left-sided chest tube. Unchanged tiny left apical pneumothorax. Unchanged subcutaneous emphysema in the left chest wall. Stable cardiomediastinal silhouette status post CABG. Postsurgical changes and volume loss in the left hemithorax from prior partial lung resection. The lungs are clear. No pleural effusion. No acute osseous abnormality. IMPRESSION: 1. Unchanged tiny left apical pneumothorax status post chest tube removal. Electronically Signed   By: Titus Dubin M.D.   On: 09/19/2022 12:30   DG Chest Port 1 View  Result Date: 09/19/2022 CLINICAL DATA:  Chest tube. EXAM: PORTABLE CHEST 1 VIEW COMPARISON:   09/18/2022 FINDINGS: Left-sided chest tube in unchanged position. Tiny left apical pneumothorax. Mild bilateral interstitial thickening unchanged from the prior exam. Bilateral emphysematous changes. Partial left lower lobectomy. Stable cardiomediastinal silhouette. No aggressive osseous lesion. IMPRESSION: Left-sided chest tube in unchanged position. Tiny left apical pneumothorax. Electronically Signed   By: Kathreen Devoid M.D.   On: 09/19/2022 09:05   DG Chest Port 1 View  Result Date: 09/18/2022 CLINICAL DATA:  Status post lobectomy EXAM: PORTABLE CHEST 1 VIEW COMPARISON:  09/16/2022 FINDINGS: Left-sided chest tube in satisfactory position. Interval partial left lobectomy. No pneumothorax. No pleural effusion. Stable cardiomediastinal silhouette. Prior CABG. No acute osseous abnormality. IMPRESSION: Left-sided chest tube in satisfactory position. No pneumothorax. Electronically Signed   By: Kathreen Devoid M.D.   On: 09/18/2022 12:22   DG Chest 2 View  Result Date: 09/17/2022 CLINICAL DATA:  81 year old male with preoperative chest x-ray. Given history of left lung cancer EXAM: CHEST - 2 VIEW COMPARISON:  06/27/2022 FINDINGS: Cardiomediastinal silhouette unchanged in size and contour. Surgical changes of median sternotomy and CABG. Stigmata of emphysema, with increased retrosternal airspace, flattened hemidiaphragms, increased AP diameter, and hyperinflation on the AP view. Redemonstration of mass at the left lung base, better demonstrated on prior PET-CT. Associated airspace/interstitial disease appears somewhat improved from the prior plain film. No new confluent airspace disease. No pneumothorax. No pleural effusion. Degenerative changes of the spine IMPRESSION: Redemonstration of known left lung mass, with no acute cardiopulmonary disease Electronically Signed   By: Corrie Mckusick D.O.   On: 09/17/2022 12:28   CT Head Wo Contrast  Result Date: 08/30/2022 CLINICAL DATA:  Vertigo EXAM: CT HEAD WITHOUT  CONTRAST TECHNIQUE: Contiguous axial images were obtained from the base of the skull through the vertex without intravenous contrast. RADIATION DOSE REDUCTION: This exam was performed according to the departmental dose-optimization program which includes automated exposure control, adjustment of the mA and/or kV according to patient size and/or use of iterative reconstruction technique. COMPARISON:  01/31/2020 CT head, correlation is also made with MRI head 07/09/2022 FINDINGS: Brain: No evidence of acute infarction, hemorrhage, mass, mass effect, or midline shift. No hydrocephalus or extra-axial fluid collection. Periventricular white matter changes, likely the sequela of chronic small vessel ischemic disease. An area of hypodensity in the right frontal lobe (series 2, image 18) is not present on the prior CT but is favored to be subacute to chronic small vessel ischemic disease. Vascular: No hyperdense vessel. Atherosclerotic calcifications in the intracranial carotid and vertebral arteries. Skull: Normal. Negative for fracture or  focal lesion. Sinuses/Orbits: No acute finding. Other: The mastoid air cells are well aerated. IMPRESSION: 1. Hypodensity in the right frontal lobe, which was not present on the 2019 CT, is favored to be subacute to chronic small vessel ischemic disease. If there is persistent clinical concern for acute infarct, an MRI could help differentiate the acuity of this change. 2. No evidence of acute cortical infarct or hemorrhage. Electronically Signed   By: Merilyn Baba M.D.   On: 08/30/2022 22:36      Discharge Medications: Allergies as of 09/20/2022       Reactions   Prednisone Itching   Sulfa Antibiotics Other (See Comments)   Unknown        Medication List     STOP taking these medications    ondansetron 4 MG disintegrating tablet Commonly known as: ZOFRAN-ODT       TAKE these medications    meclizine 25 MG tablet Commonly known as: ANTIVERT Take 1 tablet (25  mg total) by mouth 3 (three) times daily as needed for dizziness or nausea.   metoprolol tartrate 50 MG tablet Commonly known as: LOPRESSOR Take 50 mg by mouth as needed (Tachycardia).   oxyCODONE 5 MG immediate release tablet Commonly known as: Oxy IR/ROXICODONE Take 1 tablet (5 mg total) by mouth every 6 (six) hours as needed for up to 7 days for moderate pain or severe pain.   rosuvastatin 10 MG tablet Commonly known as: CRESTOR Take 10 mg by mouth every morning.   Trelegy Ellipta 100-62.5-25 MCG/ACT Aepb Generic drug: Fluticasone-Umeclidin-Vilant Inhale 1 puff into the lungs daily before lunch.   Vitamin B-12 5000 MCG Subl Place 1 tablet under the tongue once a week. Saturdays        Follow Up Appointments:  Follow-up Information     Melrose Nakayama, MD. Go on 09/30/2022.   Specialty: Cardiothoracic Surgery Why: PA/LAT CXR to be taken (at [facility] which is in the same building as Dr. Leonarda Salon office) on 10/10 at 11:30 am;Appointment time is at 12:00 Contact information: [place] [address] [phone]                 Signed: Gaspar Bidding 09/23/2022, 1:30 PM

## 2022-09-19 ENCOUNTER — Inpatient Hospital Stay (HOSPITAL_COMMUNITY): Payer: PPO

## 2022-09-19 ENCOUNTER — Encounter (HOSPITAL_COMMUNITY): Payer: Self-pay | Admitting: Thoracic Surgery (Cardiothoracic Vascular Surgery)

## 2022-09-19 LAB — CBC
HCT: 35.7 % — ABNORMAL LOW (ref 39.0–52.0)
Hemoglobin: 11.8 g/dL — ABNORMAL LOW (ref 13.0–17.0)
MCH: 29.5 pg (ref 26.0–34.0)
MCHC: 33.1 g/dL (ref 30.0–36.0)
MCV: 89.3 fL (ref 80.0–100.0)
Platelets: 255 10*3/uL (ref 150–400)
RBC: 4 MIL/uL — ABNORMAL LOW (ref 4.22–5.81)
RDW: 13 % (ref 11.5–15.5)
WBC: 9.1 10*3/uL (ref 4.0–10.5)
nRBC: 0 % (ref 0.0–0.2)

## 2022-09-19 LAB — BASIC METABOLIC PANEL
Anion gap: 5 (ref 5–15)
BUN: 12 mg/dL (ref 8–23)
CO2: 23 mmol/L (ref 22–32)
Calcium: 7.9 mg/dL — ABNORMAL LOW (ref 8.9–10.3)
Chloride: 107 mmol/L (ref 98–111)
Creatinine, Ser: 1.34 mg/dL — ABNORMAL HIGH (ref 0.61–1.24)
GFR, Estimated: 53 mL/min — ABNORMAL LOW (ref >60)
Glucose, Bld: 166 mg/dL — ABNORMAL HIGH (ref 70–99)
Potassium: 4.4 mmol/L (ref 3.5–5.1)
Sodium: 135 mmol/L (ref 135–145)

## 2022-09-19 MED ORDER — METOPROLOL TARTRATE 12.5 MG HALF TABLET
12.5000 mg | ORAL_TABLET | Freq: Two times a day (BID) | ORAL | Status: DC
  Administered 2022-09-19: 12.5 mg via ORAL
  Filled 2022-09-19 (×2): qty 1

## 2022-09-19 NOTE — Progress Notes (Addendum)
      [address]       [place] [address]             [phone]       1 Day Post-Op Procedure(s) (LRB): XI ROBOTIC ASSISTED THORACOSCOPY-LEFT LOWER LOBECTOMY (Left) LYMPH NODE DISSECTION (Left) INTERCOSTAL NERVE BLOCK (Left)  Subjective: Patient had increased pain as was being positioned for chest x ray this am.  Objective: Vital signs in last 24 hours: Temp:  [97 F (36.1 C)-98 F (36.7 C)] 97.5 F (36.4 C) (09/29 0343) Pulse Rate:  [56-99] 79 (09/29 0343) Cardiac Rhythm: Normal sinus rhythm (09/29 0700) Resp:  [12-22] 15 (09/29 0343) BP: (102-153)/(64-94) 102/64 (09/29 0343) SpO2:  [93 %-97 %] 97 % (09/29 0343) Arterial Line BP: (122-157)/(56-70) 135/58 (09/28 1330) Weight:  [66.7 kg] 66.7 kg (09/28 1500)    Intake/Output from previous day: 09/28 0701 - 09/29 0700 In: 2259 [P.O.:160; I.V.:2099] Out: 2563 [Urine:1005; Blood:20; Chest Tube:60]   Physical Exam:  Cardiovascular: RRR, Pulmonary: Clear to auscultation bilaterally, rub on left with chest tube in place Abdomen: Soft, non tender, bowel sounds present. Extremities: SCDs in place Wounds: Clean and dry.  No erythema or signs of infection. Chest Tube:to water seal, no air leak  Lab Results: CBC: Recent Labs    09/16/22 1444 09/19/22 0015  WBC 9.6 9.1  HGB 12.6* 11.8*  HCT 38.6* 35.7*  PLT 313 255   BMET:  Recent Labs    09/16/22 1444 09/19/22 0015  NA 137 135  K 3.9 4.4  CL 106 107  CO2 23 23  GLUCOSE 89 166*  BUN 14 12  CREATININE 1.11 1.34*  CALCIUM 8.8* 7.9*    PT/INR:  Recent Labs    09/16/22 1444  LABPROT 14.1  INR 1.1   ABG:  INR: Will add last result for INR, ABG once components are confirmed Will add last 4 CBG results once components are confirmed  Assessment/Plan:  1. CV - SR. On Lopressor 12.5 mg bid. 2.  Pulmonary - Chest tube with 60 cc since surgery. Chest tube is to water seal, no air leak. CXR this am shows minor subcutaneous emphysema  left lateral chest wall . May be able to remove chest tube. Encourage incentive spirometer. Await final pathology for lymph node staging. 3. Expected blood loss anemia-H and H this am 11.8 and 35.7 4. Creatinine this am slightly increased to 1.34.Re check in am 5. Decrease IVF, remove foley 6. On Lovenox for DVT prophylaxis  Donielle M ZimmermanPA-C 09/19/2022,7:23 AM  Patient seen and examined, agree with above No air leak, minimal drainage- dc chest tube Ambulate Creatinine up a little, monitor  Wendi Lastra C. Roxan Hockey, MD [facility] [phone]

## 2022-09-19 NOTE — Progress Notes (Signed)
Mobility Specialist Progress Note    09/19/22 1141  Mobility  Activity Ambulated with assistance in hallway  Level of Assistance Contact guard assist, steadying assist  Assistive Device Front wheel walker  Distance Ambulated (ft) 140 ft  Activity Response Tolerated well  $Mobility charge 1 Mobility   Pre-Mobility: 75 HR, 93% SpO2 During Mobility: 94 HR, 88-90% SpO2 on RA Post-Mobility: 86 HR, 112/87 (97) BP, 90% SpO2  Pt received in bed and agreeable. No complaints on walk. SpO2 88-90% on RA. Returned to sitting EOB with call bell in reach.    Hildred Alamin Mobility Specialist

## 2022-09-19 NOTE — Op Note (Signed)
NAMEFALLON, HOWERTER MEDICAL RECORD NO: [id number] ACCOUNT NO: [id number] DATE OF BIRTH: [birth year] FACILITY: [facility] LOCATION: [facility] PHYSICIAN: Revonda Standard. Roxan Hockey, MD  Operative Report   DATE OF PROCEDURE: 09/18/2022  PREOPERATIVE DIAGNOSIS:  Squamous cell carcinoma, left lower lobe, clinical stage IIA (T2b, N0).  POSTOPERATIVE DIAGNOSIS:  Squamous cell carcinoma, left lower lobe, clinical stage IIA (T2b, N0).  PROCEDURE:  Xi robotic-assisted left lower lobectomy, lymph node dissection, intercostal nerve blocks levels 3 through 10.  SURGEON:  Revonda Standard. Roxan Hockey, MD  ASSISTANT:  Enid Cutter, PA  ANESTHESIA:  General.  FINDINGS:  Adhesions of upper lobe to apex and the anterior mediastinum.  Large mass in left lower lobe. Enlarged level 8 lymph node.  Frozen section revealed lymphoid hyperplasia but no carcinoma.  Bronchial margin free of tumor.  CLINICAL NOTE:  Mr. Becvar is an 81 year old man with a history of tobacco abuse and multiple other medical issues.  He has had coronary bypass grafting about 7 years ago.  He was found to have a left lower lobe lung mass measuring about 4.3 cm along  with the borderline hilar adenopathy. On PET/CT, the mass was hypermetabolic, but the node was equivocal. Bronchoscopy and endobronchial ultrasound showed squamous cell carcinoma and the lymph node was negative.  He was referred for surgery. After  cardiology clearance, he was advised to undergo robotic-assisted left lower lobectomy.  The indications, risks, benefits, and alternatives were discussed in detail with the patient.  He understood and accepted the risks and agreed to proceed.  OPERATIVE NOTE:  Mr. Gopal was brought to the preoperative holding area on 09/18/2022.  Anesthesia established intravenous access and placed an arterial blood pressure monitoring line.  He was taken to the operating room and anesthetized and intubated  with a double lumen endotracheal tube.   Intravenous antibiotics were administered.  Sequential compression devices were placed on the calves for DVT prophylaxis.  A Foley catheter was placed.  He was placed in a right lateral decubitus position.  A Bair  Hugger was placed for active warming.  The left chest was prepped and draped in the usual sterile fashion.  Single lung ventilation of the right lung was initiated and was tolerated well throughout the procedure.  A timeout was performed.  A solution containing 20 mL of liposomal bupivacaine, 30 mL of 0.5% bupivacaine and 50 mL of saline was prepared.  This solution was used for intercostal nerve blocks as well as local anesthesia at the incision sites.  Incision  was made in the eighth interspace in approximately the midaxillary line, an 8 mm robotic port was inserted.  The thoracoscope was advanced into the chest. After confirming intrapleural placement, carbon dioxide was insufflated per protocol.  A 12 mm port  was placed in the eighth interspace anterior to the camera port.  Intercostal nerve blocks then were performed from the third to the tenth interspace by injecting 10 mL of the bupivacaine solution into a subpleural plane at each level. A 12 mm AirSeal  port was placed in the tenth interspace posteriorly.  Two additional robotic ports were placed in the eighth interspace. The robot was deployed.  The camera arm was docked, targeting was performed.  The remaining arms were docked.  The robotic  instruments were inserted with thoracoscopic visualization.  On inspection, the left lung was well isolated.  There were adhesions of the left upper lobe to the apex and along the anterior mediastinum from his previous coronary artery bypass grafting, no attempt  was made to take down the adhesions along the  anterior mediastinum.  The adhesions at the apex were taken down hoping this would allow access to the AP window nodes, but this was not possible.  The lower lobe was retracted superiorly and  the inferior pulmonary ligament was divided. Level 9 node was  removed.  The pleural reflection was divided at the hilum posteriorly. An enlarged level 8 node was removed.  This node had an abnormal appearance and was sent for frozen section.  Frozen section showed no evidence of carcinoma.  There was lymphoid  hyperplasia.  The remainder of the lymph nodes all appeared grossly normal.  Level 7 nodes were removed.  The pleural reflection was divided further up superiorly along the hilum.  The tissue was dissected off the pulmonary artery posteriorly up to the  level of the fissure. Anteriorly, there were also adhesions of the lingula to the mediastinum.  These adhesions were not taken down.  The pleural reflection was divided anteriorly between the phrenic nerve and the hilum, taking care not to use any  cautery in the vicinity of the phrenic nerve.  Additional lymph nodes were removed including a level 10 node.  Working into the fissure, the pleura was divided over the fissure and gentle blunt dissection revealed the basilar segmental pulmonary artery  branch.  Dissection was carried along this.  There was a relatively large but otherwise benign-appearing level 12 node that was removed. The anterior portion of the fissure was completed with a single firing of the robotic stapler.  Level 11 node was  removed from the bifurcation of the upper and lower lobe bronchi.  A plane then was developed along the pulmonary artery working from anterior to posterior. The fissure was completed with sequential firings of the robotic stapler. Level 12 and 13 nodes  were removed from around the pulmonary artery branches.  The basilar segmental trunk was freed from surrounding attachments and encircled and divided with the robotic stapler and then the superior segmental branch was divided as well.  Lower lobe then  was retracted superiorly.  Again, the inferior vein was essentially completely dissected out by this point, it  was encircled and divided with the robotic stapler.  A green cartridge was placed on the stapler and it was placed across the left lower lobe  bronchus at its origin.  The stapler was closed.  A test inflation showed good aeration of the upper lobe.  The stapler was fired transecting the bronchus.  The vessel loop and sponges that had been in place during the dissection were removed.  The chest was  copiously irrigated with saline.  A test inflation to 30 cm water revealed some air leakage from the upper lobe.  The stapler was used to staple that area and then a second test showed no air leakage present.  The AirSeal port was removed.  A 15 cm  endoscopic retrieval bag was placed into the chest.  The lower lobe was manipulated into the endoscopic retrieval bag, which was then tightened and brought down to the inferior aspect of the chest.  The robotic instruments were removed.  The robot was  undocked.  The anterior eighth interspace incision was lengthened to 4 cm, the lower lobe then was removed in the retrieval bag through the anterior incision and sent for frozen section of the bronchial margin, which subsequently returned with no tumor  seen.  Final inspection was made for hemostasis.  A 28-French Blake drain  was placed through the original port incision and directed to the apex.  It was secured with a #1 silk suture.  Dual lung ventilation was resumed.  There was good reexpansion of  the upper lobe.  The incisions were closed in standard fashion.  Dermabond was applied.  The chest tube was placed to a Pleur-evac on waterseal.  The patient was placed back in a supine position.  He was extubated in the operating room and taken to the  postanesthetic care unit in good condition.  All sponge, needle and instrument counts were correct at the end of the procedure.  Experienced assistance was necessary for this case due to surgical complexity.  Enid Cutter served as Licensed conveyancer.  He provided  assistance with port placement, robot docking and undocking, instrument exchange, specimen retrieval, suctioning, and  wound closure.   SHW D: 09/18/2022 6:08:48 pm T: 09/19/2022 1:13:00 am  JOB: [id number]/ [id number]

## 2022-09-20 ENCOUNTER — Inpatient Hospital Stay (HOSPITAL_COMMUNITY): Payer: PPO

## 2022-09-20 LAB — COMPREHENSIVE METABOLIC PANEL
ALT: 8 U/L (ref 0–44)
AST: 14 U/L — ABNORMAL LOW (ref 15–41)
Albumin: 2.7 g/dL — ABNORMAL LOW (ref 3.5–5.0)
Alkaline Phosphatase: 83 U/L (ref 38–126)
Anion gap: 9 (ref 5–15)
BUN: 17 mg/dL (ref 8–23)
CO2: 23 mmol/L (ref 22–32)
Calcium: 8.1 mg/dL — ABNORMAL LOW (ref 8.9–10.3)
Chloride: 109 mmol/L (ref 98–111)
Creatinine, Ser: 1.25 mg/dL — ABNORMAL HIGH (ref 0.61–1.24)
GFR, Estimated: 58 mL/min — ABNORMAL LOW (ref >60)
Glucose, Bld: 98 mg/dL (ref 70–99)
Potassium: 3.7 mmol/L (ref 3.5–5.1)
Sodium: 141 mmol/L (ref 135–145)
Total Bilirubin: 0.3 mg/dL (ref 0.3–1.2)
Total Protein: 6 g/dL — ABNORMAL LOW (ref 6.5–8.1)

## 2022-09-20 LAB — CBC
HCT: 37.9 % — ABNORMAL LOW (ref 39.0–52.0)
Hemoglobin: 12.3 g/dL — ABNORMAL LOW (ref 13.0–17.0)
MCH: 29.2 pg (ref 26.0–34.0)
MCHC: 32.5 g/dL (ref 30.0–36.0)
MCV: 90 fL (ref 80.0–100.0)
Platelets: 287 10*3/uL (ref 150–400)
RBC: 4.21 MIL/uL — ABNORMAL LOW (ref 4.22–5.81)
RDW: 13.2 % (ref 11.5–15.5)
WBC: 12.4 10*3/uL — ABNORMAL HIGH (ref 4.0–10.5)
nRBC: 0 % (ref 0.0–0.2)

## 2022-09-20 MED ORDER — OXYCODONE HCL 5 MG PO TABS: 5.0000 mg | ORAL_TABLET | Freq: Four times a day (QID) | ORAL | 0 refills | Status: AC | PRN

## 2022-09-20 NOTE — TOC Initial Note (Signed)
Transition of Care [facility]) - Initial/Assessment Note    Patient Details  Name: Anthony Morrison MRN: [id number] Date of Birth: 03/27/1941  Transition of Care [facility]) CM/SW Contact:    Rissie Sculley G., RN Phone Number: 09/20/2022, 11:40 AM  Clinical Narrative:                  Anthony Morrison is an 81 year old man with a past medical history significant for tobacco abuse, COPD, CAD, CABG x 3 in 2016 (E[facility]), hypertension, hyperlipidemia, extracranial carotid disease, COVID-19, tibial vein DVT, stage III chronic kidney disease, pneumonia, paroxysmal SVT, and umbilical hernia.  He smoked a pack a day for about 50 years prior to quitting in 2016.  He had coronary bypass grafting x 3 by Dr. Servando Snare in 2016.  He was being evaluated for shortness of breath with exertion.  He would get winded with walking about one half block on level ground.  Complained of a sensation of tightness in his chest but no pressure or chest pain.  His workup included chest x-ray which showed a left lower lobe cavitary lung nodule.  That led to a CT which confirmed a 4.3 cm cavitary left lower lobe lung nodule with a borderline hilar node.  On PET/CT the mass was markedly hypermetabolic but the node was equivocal.  He underwent bronchoscopy and endobronchial ultrasound by Dr. Lanney Gins.  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  Aspirations from lymph node showed lymphoid cells and a few atypical cells but no evidence of malignancy.  He saw Dr. Grayland Ormond and was referred for consideration for surgical resection.  Patient states his insurance pays for 20 hours a week household help.  No HH services ordered.  Patient states he was provided a list of agencies that provide this service from his insurance company.  Patient will pick which agency he would like to use from list provided to him by insurance company and contact that agency.    Patient understands Anthony Morrison services were not ordered for him by provider.  Provider  updated.  Expected Discharge Plan: Home/Self Care Barriers to Discharge: No Barriers Identified  Patient Goals and CMS Choice Patient states their goals for this hospitalization and ongoing recovery are:: to return home     Expected Discharge Plan and Services Expected Discharge Plan: Home/Self Care In-house Referral: NA Discharge Planning Services: CM Consult Post Acute Care Choice: NA Living arrangements for the past 2 months: Apartment Expected Discharge Date: 09/20/22               DME Arranged: N/A DME Agency: NA       HH Arranged: NA [place] Agency: NA       Prior Living Arrangements/Services Living arrangements for the past 2 months: Apartment Lives with:: Spouse         Need for Family Participation in Patient Care: Yes (Comment) Care giver support system in place?: Yes (comment)   Criminal Activity/Legal Involvement Pertinent to Current Situation/Hospitalization: No - Comment as needed  Activities of Daily Living Home Assistive Devices/Equipment: Dentures (specify type), Eyeglasses ADL Screening (condition at time of admission) Patient's cognitive ability adequate to safely complete daily activities?: Yes Is the patient deaf or have difficulty hearing?: Yes Does the patient have difficulty seeing, even when wearing glasses/contacts?: No Does the patient have difficulty concentrating, remembering, or making decisions?: No Patient able to express need for assistance with ADLs?: Yes Does the patient have difficulty dressing or bathing?: No Independently performs ADLs?: Yes (appropriate for developmental  age) Does the patient have difficulty walking or climbing stairs?: No Weakness of Legs: None Weakness of Arms/Hands: None  Permission Sought/Granted                 Emotional Assessment Appearance:: Appears stated age Attitude/Demeanor/Rapport: Gracious Affect (typically observed): Accepting Orientation: : Oriented to Self, Oriented to Place, Oriented to   Time, Oriented to Situation   Psych Involvement: No (comment)  Admission diagnosis:  Lung nodule [R91.1] S/P lobectomy of lung [Z90.2] Patient Active Problem List   Diagnosis Date Noted   Lung nodule 09/18/2022   S/P lobectomy of lung 09/18/2022   Squamous cell carcinoma lung ([place]) 07/14/2022   Status post coronary artery bypass grafting 04/03/2020   Acute deep vein thrombosis (DVT) of tibial vein of both lower extremities ([place]) 02/27/2020   Ulnar neuropathy of left upper extremity 02/01/2020   Severe protein-calorie malnutrition ([place]) 01/31/2020   Upper GI bleed 01/28/2020   Elevated d-dimer 01/26/2020   Acute deep vein thrombosis (DVT) of lower extremity ([place]) 01/26/2020   Pneumonia due to COVID-19 virus 01/25/2020   COPD (chronic obstructive pulmonary disease) ([place])    Acute hypoxemic respiratory failure due to COVID-19 ([place]) 01/23/2020   COVID-19 virus infection 01/22/2020   CAP (community acquired pneumonia) 01/22/2020   Acute renal failure superimposed on stage 3 chronic kidney disease ([place]) 01/22/2020   Elevated troponin 01/22/2020   Sepsis ([place]) 01/22/2020   Hypotension 01/22/2020   History of 2019 novel coronavirus disease (COVID-19) 01/12/2020   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   Atherosclerosis of native arteries of extremity with intermittent claudication ([place]) 02/28/2019   Carotid stenosis, asymptomatic, bilateral 02/21/2019   PAD (peripheral artery disease) ([place]) 02/21/2019   Bilateral carotid artery stenosis 02/01/2019   PSVT (paroxysmal supraventricular tachycardia) ([place]) 01/31/2019   ASCVD (arteriosclerotic cardiovascular disease) 01/25/2019   S/P CABG x 3 01/14/2019   Stable angina ([place]) 01/12/2019   Benign essential HTN 03/49/1791   Umbilical hernia without obstruction and without gangrene 12/27/2018   Hyperlipidemia, mixed 03/25/2018   Medicare annual wellness visit, initial 03/24/2017   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 [place] [address] Phone: [phone] Fax: [place] (N), [place]) [place] [address] Phone: [phone] Fax: [phone]  Social Determinants of Health (SDOH) Interventions   Readmission Risk Interventions     No data to display

## 2022-09-20 NOTE — Progress Notes (Addendum)
[address]       [place] [address]             [phone]      2 Days Post-Op Procedure(s) (LRB): XI ROBOTIC ASSISTED THORACOSCOPY-LEFT LOWER LOBECTOMY (Left) LYMPH NODE DISSECTION (Left) INTERCOSTAL NERVE BLOCK (Left) Subjective: Feels reasonably well, no specific c/o  Objective: Vital signs in last 24 hours: Temp:  [97.7 F (36.5 C)-97.8 F (36.6 C)] 97.8 F (36.6 C) (09/30 0835) Pulse Rate:  [76-88] 76 (09/30 0835) Cardiac Rhythm: Heart block (09/30 0700) Resp:  [15-16] 16 (09/30 0835) BP: (111-154)/(69-76) 130/70 (09/30 0835) SpO2:  [93 %-98 %] 98 % (09/30 0835)  Hemodynamic parameters for last 24 hours:    Intake/Output from previous day: 09/29 0701 - 09/30 0700 In: 246.7 [I.V.:246.7] Out: -  Intake/Output this shift: No intake/output data recorded.  General appearance: alert, cooperative, and no distress Heart: regular rate and rhythm Lungs: dim left base Abdomen: benign Extremities: no edema or calf tenderness Wound: incis healing well  Lab Results: Recent Labs    09/19/22 0015 09/20/22 0052  WBC 9.1 12.4*  HGB 11.8* 12.3*  HCT 35.7* 37.9*  PLT 255 287   BMET:  Recent Labs    09/19/22 0015 09/20/22 0052  NA 135 141  K 4.4 3.7  CL 107 109  CO2 23 23  GLUCOSE 166* 98  BUN 12 17  CREATININE 1.34* 1.25*  CALCIUM 7.9* 8.1*    PT/INR: No results for input(s): "LABPROT", "INR" in the last 72 hours. ABG    Component Value Date/Time   PHART 7.45 09/16/2022 1533   HCO3 22.2 09/16/2022 1533   TCO2 25 01/15/2019 0443   ACIDBASEDEF 1.0 09/16/2022 1533   O2SAT 100 09/16/2022 1533   CBG (last 3)  No results for input(s): "GLUCAP" in the last 72 hours.  Meds Scheduled Meds:  acetaminophen  1,000 mg Oral Q6H   Or   acetaminophen (TYLENOL) oral liquid 160 mg/5 mL  1,000 mg Oral Q6H   bisacodyl  10 mg Oral Daily   enoxaparin (LOVENOX) injection  40 mg Subcutaneous Daily   fluticasone furoate-vilanterol  1 puff  Inhalation Daily   And   umeclidinium bromide  1 puff Inhalation Daily   metoprolol tartrate  12.5 mg Oral BID   pantoprazole  40 mg Oral Daily   rosuvastatin  10 mg Oral BH-q7a   senna-docusate  1 tablet Oral QHS   Continuous Infusions:  sodium chloride 10 mL/hr at 09/19/22 0901   PRN Meds:.meclizine, morphine injection, ondansetron (ZOFRAN) IV, oxyCODONE, traMADol  Xrays DG Chest 2 View  Result Date: 09/20/2022 CLINICAL DATA:  Postop.  Lung nodule surgery. EXAM: CHEST - 2 VIEW COMPARISON:  09/19/2022 and older studies. FINDINGS: Small left apical pneumothorax. Increased opacity at the left lung base now obscures hemidiaphragm likely a combination of pleural fluid and atelectasis. Mild atelectasis at the medial right lung base. Remainder of the lungs is clear. Stable changes from CABG surgery. Cardiac silhouette normal in size. Left lateral chest wall subcutaneous emphysema, stable. IMPRESSION: 1. Small left apical pneumothorax without change from the previous day's exam allowing for differences in patient positioning. 2. Increased opacity at the left lung base likely a combination of pleural fluid and atelectasis. 3. No other change. Electronically Signed   By: Lajean Manes M.D.   On: 09/20/2022 08:00   DG Chest 1V REPEAT Same Day  Result Date: 09/19/2022 CLINICAL DATA:  Chest tube removal. EXAM: CHEST -  1 VIEW SAME DAY COMPARISON:  Chest x-ray from same day. FINDINGS: Interval removal of the left-sided chest tube. Unchanged tiny left apical pneumothorax. Unchanged subcutaneous emphysema in the left chest wall. Stable cardiomediastinal silhouette status post CABG. Postsurgical changes and volume loss in the left hemithorax from prior partial lung resection. The lungs are clear. No pleural effusion. No acute osseous abnormality. IMPRESSION: 1. Unchanged tiny left apical pneumothorax status post chest tube removal. Electronically Signed   By: Titus Dubin M.D.   On: 09/19/2022 12:30   DG  Chest Port 1 View  Result Date: 09/19/2022 CLINICAL DATA:  Chest tube. EXAM: PORTABLE CHEST 1 VIEW COMPARISON:  09/18/2022 FINDINGS: Left-sided chest tube in unchanged position. Tiny left apical pneumothorax. Mild bilateral interstitial thickening unchanged from the prior exam. Bilateral emphysematous changes. Partial left lower lobectomy. Stable cardiomediastinal silhouette. No aggressive osseous lesion. IMPRESSION: Left-sided chest tube in unchanged position. Tiny left apical pneumothorax. Electronically Signed   By: Kathreen Devoid M.D.   On: 09/19/2022 09:05   DG Chest Port 1 View  Result Date: 09/18/2022 CLINICAL DATA:  Status post lobectomy EXAM: PORTABLE CHEST 1 VIEW COMPARISON:  09/16/2022 FINDINGS: Left-sided chest tube in satisfactory position. Interval partial left lobectomy. No pneumothorax. No pleural effusion. Stable cardiomediastinal silhouette. Prior CABG. No acute osseous abnormality. IMPRESSION: Left-sided chest tube in satisfactory position. No pneumothorax. Electronically Signed   By: Kathreen Devoid M.D.   On: 09/18/2022 12:22    Assessment/Plan: S/P Procedure(s) (LRB): XI ROBOTIC ASSISTED THORACOSCOPY-LEFT LOWER LOBECTOMY (Left) LYMPH NODE DISSECTION (Left) INTERCOSTAL NERVE BLOCK (Left) POD#2 1 afeb, S BP 111-154,mostly in good range,  sinus rhythm, some PVC's- on home beta blocker dose- will need ongoing  f/u with PMD 2 O2 sats good on RA, did drop to upper 80's with mobility team 3 voiding- not recorded, + BM 4 creat trend improved, 1.25 today 5 H/H trend improved 6 minor leukocytosis, prob reactive 7 CXR - somewhat increased opacity in left base, small Left pntx- stable 8 final path pending 9 doesn't need home nursing bit would like to see if there can be any help arranged short term at home- says his insurance company qualifies him for 20 hours/wk- will ask TOC to assist- poss home later today      LOS: 2 days    [name] PA-C Pager [phone] 09/20/2022    Patient seen and examined, agree with above Looks great, home later today Had large pleural space and LUL doesn't completely fill it, accounts for appearance of CXR Path pending  Remo Lipps C. Roxan Hockey, MD [facility] [phone]

## 2022-09-22 LAB — TYPE AND SCREEN
ABO/RH(D): O POS
Antibody Screen: POSITIVE
DAT, IgG: NEGATIVE
Unit division: 0
Unit division: 0

## 2022-09-22 LAB — BPAM RBC
Blood Product Expiration Date: 202310292359
Blood Product Expiration Date: 202310312359
Unit Type and Rh: 5100
Unit Type and Rh: 5100

## 2022-09-24 LAB — SURGICAL PATHOLOGY

## 2022-09-29 ENCOUNTER — Other Ambulatory Visit: Payer: Self-pay | Admitting: Thoracic Surgery (Cardiothoracic Vascular Surgery)

## 2022-09-29 DIAGNOSIS — C3490 Malignant neoplasm of unspecified part of unspecified bronchus or lung: Secondary | ICD-10-CM

## 2022-09-30 ENCOUNTER — Ambulatory Visit
Admission: RE | Admit: 2022-09-30 | Discharge: 2022-09-30 | Disposition: A | Discharge status: Home / Self Care | Payer: PPO | Source: Ambulatory Visit | Attending: Thoracic Surgery (Cardiothoracic Vascular Surgery) | Admitting: Thoracic Surgery (Cardiothoracic Vascular Surgery)

## 2022-09-30 ENCOUNTER — Ambulatory Visit (INDEPENDENT_AMBULATORY_CARE_PROVIDER_SITE_OTHER): Payer: Self-pay | Admitting: Thoracic Surgery (Cardiothoracic Vascular Surgery)

## 2022-09-30 VITALS — BP 145/75 | HR 94 | Resp 18 | Ht 66.0 in | Wt 143.0 lb

## 2022-09-30 DIAGNOSIS — Z9889 Other specified postprocedural states: Secondary | ICD-10-CM | POA: Diagnosis not present

## 2022-09-30 DIAGNOSIS — J939 Pneumothorax, unspecified: Secondary | ICD-10-CM | POA: Diagnosis not present

## 2022-09-30 DIAGNOSIS — C3490 Malignant neoplasm of unspecified part of unspecified bronchus or lung: Secondary | ICD-10-CM

## 2022-09-30 DIAGNOSIS — J948 Other specified pleural conditions: Secondary | ICD-10-CM | POA: Diagnosis not present

## 2022-09-30 DIAGNOSIS — Z951 Presence of aortocoronary bypass graft: Secondary | ICD-10-CM | POA: Diagnosis not present

## 2022-09-30 DIAGNOSIS — Z902 Acquired absence of lung [part of]: Secondary | ICD-10-CM

## 2022-09-30 NOTE — Progress Notes (Signed)
[address]       [place], [address]             [phone]    HPI: Anthony Morrison returns for a scheduled follow-up visit after recent left lower lobectomy  Anthony Morrison is an 81 year old man with a history of tobacco abuse, COPD, CAD, CABG x3, hypertension, hyperlipidemia, ECCE OD, COVID-19, tibial vein DVT, stage III CKD, pneumonia, paroxysmal SVT, umbilical hernia, and lung cancer.  He had a 50-pack-year history of smoking prior to quitting in 2016 around the time of his bypass surgery.  He presented with shortness of breath with exertion was found to have a lung mass which measured 4.3 cm on CT.  Bronchoscopy and endobronchial ultrasound showed squamous cell carcinoma.  I did a robotic assisted left lower lobectomy and node dissection on 09/18/2022.  He did well postoperatively and went home on day 2.  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  Overall he feels well.  He does have some incisional discomfort mostly along the left costal margin.  He is managing that by taking one 500 mg tablet 4 times a day.  He is walking.  Does have some shortness of breath with exertion.  He also had this preoperatively but he is a little below his preoperative baseline currently.  Past Medical History:  Diagnosis Date   Acute deep vein thrombosis (DVT) of tibial vein of left lower extremity (HCC)    Acute renal failure superimposed on stage 3 chronic kidney disease (HCC)    Arthritis    ASCVD (arteriosclerotic cardiovascular disease)    Bronchitis    Cancer (HCC)    SKIN CANCERS   Carotid stenosis    bilateral-100%blockage on right carotid   COPD (chronic obstructive pulmonary disease) (HCC)    Panlobular emphysema   Coronary artery disease    COVID-19 2020   Diverticulitis    Dyspnea    with exertion   Elevated d-dimer    Elevated troponin    Hemorrhoids    Hyperlipidemia    Hypertension    PAD (peripheral artery disease) ([place])     Pneumonia 2016   PSVT (paroxysmal supraventricular tachycardia) (HCC)    S/P CABG x 3 2019   Sepsis (HCC)    Stable angina (HCC)    Tachycardia    Umbilical hernia    Upper GI bleed    Vitamin B12 deficiency     Current Outpatient Medications  Medication Sig Dispense Refill   Cyanocobalamin (VITAMIN B-12) 5000 MCG SUBL Place 1 tablet under the tongue once a week. Saturdays     Fluticasone-Umeclidin-Vilant (TRELEGY ELLIPTA) 100-62.5-25 MCG/ACT AEPB Inhale 1 puff into the lungs daily before lunch.     meclizine (ANTIVERT) 25 MG tablet Take 1 tablet (25 mg total) by mouth 3 (three) times daily as needed for dizziness or nausea. 20 tablet 0   metoprolol tartrate (LOPRESSOR) 50 MG tablet Take 50 mg by mouth as needed (Tachycardia).     rosuvastatin (CRESTOR) 10 MG tablet Take 10 mg by mouth every morning.     No current facility-administered medications for this visit.    Physical Exam BP (!) 145/75 (BP Location: Left Arm, Patient Position: Sitting)   Pulse 94   Resp 18   Ht 5\' 6"  (1.676 m)   Wt 143 lb (64.9 kg)   SpO2 95% Comment: RA  BMI 23.56 kg/m  81 year old man in no acute distress Alert  and oriented x3 with no focal deficits Lungs absent breath sounds left base, otherwise clear Incisions with minimal erythema at chest tube site Cardiac regular rate and rhythm No peripheral edema  Diagnostic Tests: CHEST - 2 VIEW   COMPARISON:  Radiographs 09/20/2022 and 09/19/2022.  CT 06/12/2022.   FINDINGS: Stable volume loss in the left hemithorax status post thoracotomy and lower lobe resection. There is a small left-sided hydropneumothorax which is not significantly changed in volume from the most recent postoperative study. The right lung is clear. The heart size and mediastinal contours are stable post CABG. The bones appear unremarkable.   IMPRESSION: Stable postoperative chest with small left-sided hydropneumothorax, unchanged in overall volume from most recent  postoperative study.     Electronically Signed   By: Richardean Sale M.D.   On: 09/30/2022 11:36 I personally reviewed the chest x-ray images.  Findings as noted above.  Impression: Anthony Morrison is an 81 year old man with a history of tobacco abuse, COPD, CAD, CABG x3, hypertension, hyperlipidemia, ECCE OD, COVID-19, tibial vein DVT, stage III CKD, pneumonia, paroxysmal SVT, umbilical hernia, and lung cancer.   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   He will see Dr. Grayland Ormond next week.  They can discuss possible adjuvant treatment.  Status post left lower lobectomy-he is now almost 2 weeks out from surgery.  Looks great.  His chest x-ray does have some elevation of the diaphragm and some pleural effusion.  Consistent with a postoperative space.  We will get another chest x-ray in about a month.  Postoperative pain-mild and controlled with 1 Tylenol tablet 4 times a day.  He knows to notify me if he starts having worsening pain.  He may begin driving.  Appropriate precautions were discussed.  Plan: Follow-up with Dr. Grayland Ormond as scheduled Follow-up with Dr. Lanney Gins I will plan to see him back in about a month with a PA and lateral chest x-ray.  Anthony Nakayama, MD [facility] [phone]

## 2022-10-02 ENCOUNTER — Other Ambulatory Visit: Payer: Self-pay | Admitting: *Deleted

## 2022-10-02 NOTE — Progress Notes (Signed)
The proposed treatment discussed in conference is for discussion purpose only and is not a binding recommendation.  The patients have not been physically examined, or presented with their treatment options.  Therefore, final treatment plans cannot be decided.  

## 2022-10-02 NOTE — Progress Notes (Signed)
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  INTERVAL HISTORY: Patient returns to clinic today postoperatively for further evaluation, discussion of his final pathology results, and treatment planning.  He continues to have incisional tenderness, but otherwise feels well.  He has no neurologic complaints.  He denies any recent fevers or illnesses.  He has a good appetite and denies weight loss.  He has no chest pain, shortness of breath, cough, or hemoptysis.  He denies any nausea, vomiting, constipation, or diarrhea.  He has no urinary complaints.  Patient offers no further specific complaints today.  REVIEW OF SYSTEMS:   Review of Systems  Constitutional: Negative.  Negative for fever, malaise/fatigue and weight loss.  Respiratory: Negative.  Negative for cough, hemoptysis and shortness of breath.   Cardiovascular: Negative.  Negative for chest pain and leg swelling.  Gastrointestinal: Negative.  Negative for abdominal pain.  Genitourinary: Negative.  Negative for frequency.  Musculoskeletal: Negative.  Negative for back pain.  Skin: Negative.  Negative for rash.  Neurological: Negative.  Negative for dizziness, focal weakness, weakness and headaches.  Psychiatric/Behavioral: Negative.  The patient is not nervous/anxious.     As per HPI. Otherwise, a complete review of systems is negative.  PAST MEDICAL HISTORY: Past Medical History:  Diagnosis Date   Acute deep vein thrombosis (DVT) of tibial vein of left lower extremity (HCC)    Acute renal failure superimposed on stage 3 chronic kidney disease (HCC)    Arthritis    ASCVD (arteriosclerotic cardiovascular disease)     Bronchitis    Cancer (HCC)    SKIN CANCERS   Carotid stenosis    bilateral-100%blockage on right carotid   COPD (chronic obstructive pulmonary disease) ([place])    Panlobular emphysema   Coronary artery disease    COVID-19 2020   Diverticulitis    Dyspnea    with exertion   Elevated d-dimer    Elevated troponin    Hemorrhoids    Hyperlipidemia    Hypertension    PAD (peripheral artery disease) ([place])    Pneumonia 2016   PSVT (paroxysmal supraventricular tachycardia)    S/P CABG x 3 2019   Sepsis (HCC)    Stable angina    Tachycardia    Umbilical hernia    Upper GI bleed    Vitamin B12 deficiency     PAST SURGICAL HISTORY: Past Surgical History:  Procedure Laterality Date   COLONOSCOPY  2015   CORONARY ARTERY BYPASS GRAFT N/A 01/14/2019   Procedure: Emergency Coronary Artery Bypass Grafting (CABG)  times three using left internal mammary artery to LAD, reversed saphenous vein graft to OM and RCA;  Surgeon: Grace Isaac, MD;  Location: [facility];  Service: Open Heart Surgery;  Laterality: N/A;   ENDOVEIN HARVEST OF GREATER SAPHENOUS VEIN Right 01/14/2019   Procedure: ENDOVEIN HARVEST OF GREATER SAPHENOUS VEIN;  Surgeon: Grace Isaac, MD;  Location: [facility];  Service: Open Heart Surgery;  Laterality: Right;   EYE SURGERY     CATARACTS BIL   INTERCOSTAL NERVE BLOCK Left 09/18/2022   Procedure: INTERCOSTAL NERVE BLOCK;  Surgeon: Melrose Nakayama, MD;  Location: [facility];  Service: Thoracic;  Laterality: Left;   LEFT HEART CATH  AND CORONARY ANGIOGRAPHY Left 01/14/2019   Procedure: LEFT HEART CATH AND CORONARY ANGIOGRAPHY;  Surgeon: Corey Skains, MD;  Location: [facility];  Service: Cardiovascular;  Laterality: Left;   LYMPH NODE DISSECTION Left 09/18/2022   Procedure: LYMPH NODE DISSECTION;  Surgeon: Melrose Nakayama, MD;  Location: [facility];  Service: Thoracic;  Laterality: Left;   TEE WITHOUT CARDIOVERSION N/A 01/14/2019   Procedure: TRANSESOPHAGEAL  ECHOCARDIOGRAM (TEE);  Surgeon: Grace Isaac, MD;  Location: [facility];  Service: Open Heart Surgery;  Laterality: N/A;   VIDEO BRONCHOSCOPY WITH ENDOBRONCHIAL NAVIGATION N/A 06/27/2022   Procedure: VIDEO BRONCHOSCOPY WITH ENDOBRONCHIAL NAVIGATION;  Surgeon: Ottie Glazier, MD;  Location: [facility];  Service: Thoracic;  Laterality: N/A;   VIDEO BRONCHOSCOPY WITH ENDOBRONCHIAL ULTRASOUND N/A 06/27/2022   Procedure: VIDEO BRONCHOSCOPY WITH ENDOBRONCHIAL ULTRASOUND;  Surgeon: Ottie Glazier, MD;  Location: [facility];  Service: Thoracic;  Laterality: N/A;    FAMILY HISTORY: Family History  Problem Relation Age of Onset   Cancer Mother    Cancer Sister        Metastatic Lung Cancer    ADVANCED DIRECTIVES (Y/N):  N  HEALTH MAINTENANCE: Social History   Tobacco Use   Smoking status: Former    Packs/day: 1.00    Years: 50.00    Total pack years: 50.00    Types: Cigarettes    Quit date: 01/06/2015    Years since quitting: 7.7   Smokeless tobacco: Never  Vaping Use   Vaping Use: Never used  Substance Use Topics   Alcohol use: Yes    Comment: OCC    Drug use: Never     Colonoscopy:  PAP:  Bone density:  Lipid panel:  Allergies  Allergen Reactions   Prednisone Itching   Sulfa Antibiotics Other (See Comments)    Unknown    Current Outpatient Medications  Medication Sig Dispense Refill   Cyanocobalamin (VITAMIN B-12) 5000 MCG SUBL Place 1 tablet under the tongue once a week. Saturdays     Fluticasone-Umeclidin-Vilant (TRELEGY ELLIPTA) 100-62.5-25 MCG/ACT AEPB Inhale 1 puff into the lungs daily before lunch.     meclizine (ANTIVERT) 25 MG tablet Take 1 tablet (25 mg total) by mouth 3 (three) times daily as needed for dizziness or nausea. 20 tablet 0   metoprolol tartrate (LOPRESSOR) 50 MG tablet Take 50 mg by mouth as needed (Tachycardia).     rosuvastatin (CRESTOR) 10 MG tablet Take 10 mg by mouth every morning.     No current facility-administered medications for this visit.     OBJECTIVE: Vitals:   10/07/22 1011  BP: 137/73  Pulse: (!) 103  Temp: (!) 96 F (35.6 C)  SpO2: 100%     Body mass index is 22.68 kg/m.    ECOG FS:0 - Asymptomatic  General: Well-developed, well-nourished, no acute distress. Eyes: Pink conjunctiva, anicteric sclera. HEENT: Normocephalic, moist mucous membranes. Lungs: No audible wheezing or coughing. Heart: Regular rate and rhythm. Abdomen: Soft, nontender, no obvious distention. Musculoskeletal: No edema, cyanosis, or clubbing. Neuro: Alert, answering all questions appropriately. Cranial nerves grossly intact. Skin: No rashes or petechiae noted. Psych: Normal affect.  LAB RESULTS:  Lab Results  Component Value Date   NA 141 09/20/2022   K 3.7 09/20/2022   CL 109 09/20/2022   CO2 23 09/20/2022   GLUCOSE 98 09/20/2022   BUN 17 09/20/2022   CREATININE 1.25 (H) 09/20/2022   CALCIUM 8.1 (L) 09/20/2022   PROT 6.0 (L) 09/20/2022   ALBUMIN 2.7 (L)  09/20/2022   AST 14 (L) 09/20/2022   ALT 8 09/20/2022   ALKPHOS 83 09/20/2022   BILITOT 0.3 09/20/2022   GFRNONAA 58 (L) 09/20/2022   GFRAA >60 02/03/2020    Lab Results  Component Value Date   WBC 12.4 (H) 09/20/2022   NEUTROABS 6.1 08/30/2022   HGB 12.3 (L) 09/20/2022   HCT 37.9 (L) 09/20/2022   MCV 90.0 09/20/2022   PLT 287 09/20/2022     STUDIES: DG Chest 2 View  Result Date: 09/30/2022 CLINICAL DATA:  Postop left lower lobectomy and lymph node dissection 09/19/2022. EXAM: CHEST - 2 VIEW COMPARISON:  Radiographs 09/20/2022 and 09/19/2022.  CT 06/12/2022. FINDINGS: Stable volume loss in the left hemithorax status post thoracotomy and lower lobe resection. There is a small left-sided hydropneumothorax which is not significantly changed in volume from the most recent postoperative study. The right lung is clear. The heart size and mediastinal contours are stable post CABG. The bones appear unremarkable. IMPRESSION: Stable postoperative chest with small left-sided  hydropneumothorax, unchanged in overall volume from most recent postoperative study. Electronically Signed   By: Richardean Sale M.D.   On: 09/30/2022 11:36   DG Chest 2 View  Result Date: 09/20/2022 CLINICAL DATA:  Postop.  Lung nodule surgery. EXAM: CHEST - 2 VIEW COMPARISON:  09/19/2022 and older studies. FINDINGS: Small left apical pneumothorax. Increased opacity at the left lung base now obscures hemidiaphragm likely a combination of pleural fluid and atelectasis. Mild atelectasis at the medial right lung base. Remainder of the lungs is clear. Stable changes from CABG surgery. Cardiac silhouette normal in size. Left lateral chest wall subcutaneous emphysema, stable. IMPRESSION: 1. Small left apical pneumothorax without change from the previous day's exam allowing for differences in patient positioning. 2. Increased opacity at the left lung base likely a combination of pleural fluid and atelectasis. 3. No other change. Electronically Signed   By: Lajean Manes M.D.   On: 09/20/2022 08:00   DG Chest 1V REPEAT Same Day  Result Date: 09/19/2022 CLINICAL DATA:  Chest tube removal. EXAM: CHEST - 1 VIEW SAME DAY COMPARISON:  Chest x-ray from same day. FINDINGS: Interval removal of the left-sided chest tube. Unchanged tiny left apical pneumothorax. Unchanged subcutaneous emphysema in the left chest wall. Stable cardiomediastinal silhouette status post CABG. Postsurgical changes and volume loss in the left hemithorax from prior partial lung resection. The lungs are clear. No pleural effusion. No acute osseous abnormality. IMPRESSION: 1. Unchanged tiny left apical pneumothorax status post chest tube removal. Electronically Signed   By: Titus Dubin M.D.   On: 09/19/2022 12:30   DG Chest Port 1 View  Result Date: 09/19/2022 CLINICAL DATA:  Chest tube. EXAM: PORTABLE CHEST 1 VIEW COMPARISON:  09/18/2022 FINDINGS: Left-sided chest tube in unchanged position. Tiny left apical pneumothorax. Mild bilateral  interstitial thickening unchanged from the prior exam. Bilateral emphysematous changes. Partial left lower lobectomy. Stable cardiomediastinal silhouette. No aggressive osseous lesion. IMPRESSION: Left-sided chest tube in unchanged position. Tiny left apical pneumothorax. Electronically Signed   By: Kathreen Devoid M.D.   On: 09/19/2022 09:05   DG Chest Port 1 View  Result Date: 09/18/2022 CLINICAL DATA:  Status post lobectomy EXAM: PORTABLE CHEST 1 VIEW COMPARISON:  09/16/2022 FINDINGS: Left-sided chest tube in satisfactory position. Interval partial left lobectomy. No pneumothorax. No pleural effusion. Stable cardiomediastinal silhouette. Prior CABG. No acute osseous abnormality. IMPRESSION: Left-sided chest tube in satisfactory position. No pneumothorax. Electronically Signed   By: Kathreen Devoid M.D.   On:  09/18/2022 12:22   DG Chest 2 View  Result Date: 09/17/2022 CLINICAL DATA:  81 year old male with preoperative chest x-ray. Given history of left lung cancer EXAM: CHEST - 2 VIEW COMPARISON:  06/27/2022 FINDINGS: Cardiomediastinal silhouette unchanged in size and contour. Surgical changes of median sternotomy and CABG. Stigmata of emphysema, with increased retrosternal airspace, flattened hemidiaphragms, increased AP diameter, and hyperinflation on the AP view. Redemonstration of mass at the left lung base, better demonstrated on prior PET-CT. Associated airspace/interstitial disease appears somewhat improved from the prior plain film. No new confluent airspace disease. No pneumothorax. No pleural effusion. Degenerative changes of the spine IMPRESSION: Redemonstration of known left lung mass, with no acute cardiopulmonary disease Electronically Signed   By: Corrie Mckusick D.O.   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  Recommendation is adjuvant chemotherapy using cisplatin and Taxotere every 3 weeks for 4 cycles.  Patient is unclear given his age and other comorbidities if he wishes to proceed with treatment.  We also briefly discussed port placement at this time if he elects for adjuvant treatment.  Return to clinic in 2 weeks for further evaluation at which time patient will make a final decision on whether or not to pursue chemotherapy.    I spent a total of 30 minutes reviewing chart data, face-to-face evaluation with the patient, counseling and coordination of care as detailed above.    Patient expressed understanding and was in agreement with this plan. He also understands that He can call clinic at any time with any questions, concerns, or complaints.    Cancer Staging  Squamous cell carcinoma lung ([place]) Staging form: Lung, AJCC 8th Edition - Pathologic stage from 09/18/2022: Stage IIB (pT3, pN0, cM0) - Signed by Lloyd Huger, MD on 10/07/2022 Stage prefix: Initial diagnosis  Lloyd Huger, MD   10/07/2022 11:26 AM

## 2022-10-07 ENCOUNTER — Inpatient Hospital Stay: Payer: PPO | Attending: Radiation Oncology | Admitting: Oncology

## 2022-10-07 ENCOUNTER — Encounter: Payer: Self-pay | Admitting: *Deleted

## 2022-10-07 ENCOUNTER — Encounter: Payer: Self-pay | Admitting: Oncology

## 2022-10-07 VITALS — BP 137/73 | HR 103 | Temp 96.0°F | Ht 66.0 in | Wt 140.5 lb

## 2022-10-07 DIAGNOSIS — Z9889 Other specified postprocedural states: Secondary | ICD-10-CM | POA: Diagnosis not present

## 2022-10-07 DIAGNOSIS — I2510 Atherosclerotic heart disease of native coronary artery without angina pectoris: Secondary | ICD-10-CM | POA: Insufficient documentation

## 2022-10-07 DIAGNOSIS — E538 Deficiency of other specified B group vitamins: Secondary | ICD-10-CM | POA: Diagnosis not present

## 2022-10-07 DIAGNOSIS — Z79899 Other long term (current) drug therapy: Secondary | ICD-10-CM | POA: Diagnosis not present

## 2022-10-07 DIAGNOSIS — C3432 Malignant neoplasm of lower lobe, left bronchus or lung: Secondary | ICD-10-CM | POA: Insufficient documentation

## 2022-10-07 DIAGNOSIS — C3490 Malignant neoplasm of unspecified part of unspecified bronchus or lung: Secondary | ICD-10-CM | POA: Diagnosis not present

## 2022-10-07 DIAGNOSIS — E785 Hyperlipidemia, unspecified: Secondary | ICD-10-CM | POA: Insufficient documentation

## 2022-10-07 DIAGNOSIS — J431 Panlobular emphysema: Secondary | ICD-10-CM | POA: Insufficient documentation

## 2022-10-07 DIAGNOSIS — Z87891 Personal history of nicotine dependence: Secondary | ICD-10-CM | POA: Diagnosis not present

## 2022-10-07 DIAGNOSIS — I129 Hypertensive chronic kidney disease with stage 1 through stage 4 chronic kidney disease, or unspecified chronic kidney disease: Secondary | ICD-10-CM | POA: Diagnosis not present

## 2022-10-07 DIAGNOSIS — I739 Peripheral vascular disease, unspecified: Secondary | ICD-10-CM | POA: Diagnosis not present

## 2022-10-07 DIAGNOSIS — Z8616 Personal history of COVID-19: Secondary | ICD-10-CM | POA: Insufficient documentation

## 2022-10-07 DIAGNOSIS — Z85828 Personal history of other malignant neoplasm of skin: Secondary | ICD-10-CM | POA: Insufficient documentation

## 2022-10-07 DIAGNOSIS — Z86718 Personal history of other venous thrombosis and embolism: Secondary | ICD-10-CM | POA: Insufficient documentation

## 2022-10-07 NOTE — Progress Notes (Signed)
Met with patient during follow up visit with Dr. Grayland Ormond to review final pathology following surgery. All questions answered during visit. Reviewed upcoming appts. Instructed to call with any questions or needs. Pt verbalized understanding.

## 2022-10-07 NOTE — Progress Notes (Signed)
START ON PATHWAY REGIMEN - Non-Small Cell Lung     A cycle is every 21 days:     Docetaxel      Cisplatin   **Always confirm dose/schedule in your pharmacy ordering system**  Patient Characteristics: Postoperative without Neoadjuvant Therapy (Pathologic Staging), Stage II-III, Adjuvant Chemotherapy, Stage IIB, Squamous Cell Therapeutic Status: Postoperative without Neoadjuvant Therapy (Pathologic Staging) AJCC T Category: pT3 AJCC N Category: pN0 AJCC M Category: cM0 AJCC 8 Stage Grouping: IIB Adjuvant Chemotherapy Status: Adjuvant Chemotherapy ALK Rearrangement Status: Awaiting Test Results EGFR Mutation Status: Awaiting Test Results Histology: Squamous Cell Intent of Therapy: Curative Intent, Discussed with Patient

## 2022-10-08 ENCOUNTER — Other Ambulatory Visit: Payer: Self-pay

## 2022-10-08 DIAGNOSIS — Z23 Encounter for immunization: Secondary | ICD-10-CM | POA: Diagnosis not present

## 2022-10-13 ENCOUNTER — Encounter: Payer: Self-pay | Admitting: *Deleted

## 2022-10-15 ENCOUNTER — Other Ambulatory Visit: Payer: Self-pay

## 2022-10-17 DIAGNOSIS — C3432 Malignant neoplasm of lower lobe, left bronchus or lung: Secondary | ICD-10-CM | POA: Diagnosis not present

## 2022-10-20 ENCOUNTER — Encounter (INDEPENDENT_AMBULATORY_CARE_PROVIDER_SITE_OTHER): Payer: Self-pay

## 2022-10-21 ENCOUNTER — Ambulatory Visit: Payer: PPO | Admitting: Oncology

## 2022-10-21 ENCOUNTER — Other Ambulatory Visit: Payer: PPO

## 2022-11-03 ENCOUNTER — Other Ambulatory Visit: Payer: Self-pay | Admitting: Thoracic Surgery (Cardiothoracic Vascular Surgery)

## 2022-11-03 DIAGNOSIS — C3490 Malignant neoplasm of unspecified part of unspecified bronchus or lung: Secondary | ICD-10-CM

## 2022-11-04 ENCOUNTER — Ambulatory Visit (INDEPENDENT_AMBULATORY_CARE_PROVIDER_SITE_OTHER): Payer: Self-pay | Admitting: Thoracic Surgery (Cardiothoracic Vascular Surgery)

## 2022-11-04 ENCOUNTER — Encounter: Payer: Self-pay | Admitting: Thoracic Surgery (Cardiothoracic Vascular Surgery)

## 2022-11-04 ENCOUNTER — Ambulatory Visit
Admission: RE | Admit: 2022-11-04 | Discharge: 2022-11-04 | Disposition: A | Discharge status: Home / Self Care | Payer: PPO | Source: Ambulatory Visit | Attending: Thoracic Surgery (Cardiothoracic Vascular Surgery) | Admitting: Thoracic Surgery (Cardiothoracic Vascular Surgery)

## 2022-11-04 VITALS — BP 133/77 | HR 94 | Resp 20 | Ht 66.0 in | Wt 138.0 lb

## 2022-11-04 DIAGNOSIS — Z902 Acquired absence of lung [part of]: Secondary | ICD-10-CM

## 2022-11-04 DIAGNOSIS — C3490 Malignant neoplasm of unspecified part of unspecified bronchus or lung: Secondary | ICD-10-CM

## 2022-11-04 DIAGNOSIS — J90 Pleural effusion, not elsewhere classified: Secondary | ICD-10-CM | POA: Diagnosis not present

## 2022-11-04 DIAGNOSIS — Z9889 Other specified postprocedural states: Secondary | ICD-10-CM | POA: Diagnosis not present

## 2022-11-04 DIAGNOSIS — C3492 Malignant neoplasm of unspecified part of left bronchus or lung: Secondary | ICD-10-CM

## 2022-11-04 DIAGNOSIS — J948 Other specified pleural conditions: Secondary | ICD-10-CM | POA: Diagnosis not present

## 2022-11-04 DIAGNOSIS — Z951 Presence of aortocoronary bypass graft: Secondary | ICD-10-CM | POA: Diagnosis not present

## 2022-11-04 NOTE — Progress Notes (Signed)
[address]       [place] [id number]             [phone]     HPI: Anthony Morrison returns for a scheduled follow-up after recent left upper lobectomy.  Anthony Morrison is an 81 year old man with a history of tobacco abuse, COPD, CAD, CABG x3, hypertension, hyperlipidemia, ECCE OD, COVID-19, tibial vein DVT, stage III chronic kidney disease, pneumonia, paroxysmal SVT, umbilical hernia, and a stage IIb lung cancer.  50-pack-year history of smoking prior to quitting in 2016.  Presented with shortness of breath with exertion.  Found to have a left lower lobe lung mass.  Biopsy showed squamous cell carcinoma.  I did a robotic assisted left lower lobectomy on 09/18/2022.  Pathology showed a T3, N0, stage IIb squamous cell carcinoma.  He did well postoperatively and went home on day 2.  I last saw him on 09/30/2022.  He was doing well at that time.  He was having some incisional discomfort which she was taking Tylenol.  In the interim since his last visit he continues to have some incisional discomfort.  He still using 500 mg of acetaminophen about 4 times a day.  Does have some shortness of breath with exertion but has walked about 1000 yards so far today.  He has lost weight.   Past Medical History:  Diagnosis Date   Acute deep vein thrombosis (DVT) of tibial vein of left lower extremity (HCC)    Acute renal failure superimposed on stage 3 chronic kidney disease (HCC)    Arthritis    ASCVD (arteriosclerotic cardiovascular disease)    Bronchitis    Cancer ([place])    SKIN CANCERS   Carotid stenosis    bilateral-100%blockage on right carotid   COPD (chronic obstructive pulmonary disease) (HCC)    Panlobular emphysema   Coronary artery disease    COVID-19 2020   Diverticulitis    Dyspnea    with exertion   Elevated d-dimer    Elevated troponin    Hemorrhoids    Hyperlipidemia    Hypertension    PAD (peripheral artery disease) ([place])    Pneumonia 2016   PSVT  (paroxysmal supraventricular tachycardia)    S/P CABG x 3 2019   Sepsis (HCC)    Stable angina    Tachycardia    Umbilical hernia    Upper GI bleed    Vitamin B12 deficiency     Current Outpatient Medications  Medication Sig Dispense Refill   Cyanocobalamin (VITAMIN B-12) 5000 MCG SUBL Place 1 tablet under the tongue once a week. Saturdays     Fluticasone-Umeclidin-Vilant (TRELEGY ELLIPTA) 100-62.5-25 MCG/ACT AEPB Inhale 1 puff into the lungs daily before lunch.     meclizine (ANTIVERT) 25 MG tablet Take 1 tablet (25 mg total) by mouth 3 (three) times daily as needed for dizziness or nausea. 20 tablet 0   metoprolol tartrate (LOPRESSOR) 50 MG tablet Take 50 mg by mouth as needed (Tachycardia).     rosuvastatin (CRESTOR) 10 MG tablet Take 10 mg by mouth every morning.     No current facility-administered medications for this visit.    Physical Exam BP 133/77 (BP Location: Left Arm, Patient Position: Sitting)   Pulse 94   Resp 20   Ht 5\' 6"  (1.676 m)   Wt 138 lb (62.6 kg)   SpO2 95% Comment: RA  BMI 22.75 kg/m  81 year old man in no acute distress Alert and oriented x3 with  no focal deficits Lungs absent breath sounds left base otherwise clear Cardiac regular rate and rhythm Incisions well-healed  Diagnostic Tests: CHEST - 2 VIEW   COMPARISON:  09/30/2022   FINDINGS: Frontal and lateral views of the chest demonstrate postsurgical changes from prior CABG. The cardiac silhouette is unremarkable. Postsurgical changes are again seen from left lower lobectomy with elevation of the left hemidiaphragm. There is persistent left-sided pleural effusion, unchanged in volume. There has been complete resolution of the gas component of the hydropneumothorax seen on prior exam.   The right chest is clear.  No acute bony abnormalities.   IMPRESSION: 1. Persistent left pleural effusion, with resolution of the gas component of the postoperative hydropneumothorax seen previously. 2.  Elevated left hemidiaphragm consistent with prior left lower lobectomy.     Electronically Signed   By: Anthony Morrison M.D.   On: 11/04/2022 15:13   I personally reviewed the chest x-ray images.  Findings consistent with previous left lower lobectomy.  He does have some pleural effusion posteriorly.  Likely just related to the upper lobe not being able to fill the entire space.  Impression: Anthony Morrison is an 81 year old man with a history of tobacco abuse, COPD, CAD, CABG x3, hypertension, hyperlipidemia, ECCE OD, COVID-19, tibial vein DVT, stage III chronic kidney disease, pneumonia, paroxysmal SVT, umbilical hernia, and a stage IIb lung cancer.  50-pack-year history of smoking prior to quitting in 2016.  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  He did see Dr. Grayland Morrison and discussed adjuvant chemotherapy, but opted not to participate.  Post left lower lobectomy-still has some discomfort but has been able to manage that with 500 mg of acetaminophen about 4 times a day.  Should continue to improve with time although he always may have some level of discomfort there.  Exercise tolerance is gradually improving.  He did have some limitations prior to surgery and as expected those are more noticeable now.  Plan: Follow-up with Dr. Grayland Morrison as scheduled in January I will plan to see him back in 3 months to check on his progress He knows to call if he has any issues in the meantime  Anthony Nakayama, MD [facility] [phone]

## 2022-11-05 ENCOUNTER — Other Ambulatory Visit: Payer: Self-pay

## 2022-11-06 DIAGNOSIS — Z951 Presence of aortocoronary bypass graft: Secondary | ICD-10-CM | POA: Diagnosis not present

## 2022-11-26 DIAGNOSIS — E538 Deficiency of other specified B group vitamins: Secondary | ICD-10-CM | POA: Diagnosis not present

## 2022-11-26 DIAGNOSIS — E782 Mixed hyperlipidemia: Secondary | ICD-10-CM | POA: Diagnosis not present

## 2022-12-03 DIAGNOSIS — E782 Mixed hyperlipidemia: Secondary | ICD-10-CM | POA: Diagnosis not present

## 2022-12-03 DIAGNOSIS — J431 Panlobular emphysema: Secondary | ICD-10-CM | POA: Diagnosis not present

## 2022-12-03 DIAGNOSIS — Z125 Encounter for screening for malignant neoplasm of prostate: Secondary | ICD-10-CM | POA: Diagnosis not present

## 2022-12-03 DIAGNOSIS — I2510 Atherosclerotic heart disease of native coronary artery without angina pectoris: Secondary | ICD-10-CM | POA: Diagnosis not present

## 2022-12-03 DIAGNOSIS — E538 Deficiency of other specified B group vitamins: Secondary | ICD-10-CM | POA: Diagnosis not present

## 2022-12-03 DIAGNOSIS — C3492 Malignant neoplasm of unspecified part of left bronchus or lung: Secondary | ICD-10-CM | POA: Diagnosis not present

## 2023-01-15 ENCOUNTER — Other Ambulatory Visit: Payer: Self-pay | Admitting: Oncology

## 2023-01-20 ENCOUNTER — Inpatient Hospital Stay: Payer: PPO | Admitting: Oncology

## 2023-02-03 ENCOUNTER — Ambulatory Visit: Payer: PPO | Admitting: Thoracic Surgery (Cardiothoracic Vascular Surgery)

## 2023-04-14 DIAGNOSIS — C3432 Malignant neoplasm of lower lobe, left bronchus or lung: Secondary | ICD-10-CM | POA: Diagnosis not present

## 2023-04-15 DIAGNOSIS — D485 Neoplasm of uncertain behavior of skin: Secondary | ICD-10-CM | POA: Diagnosis not present

## 2023-04-15 DIAGNOSIS — L570 Actinic keratosis: Secondary | ICD-10-CM | POA: Diagnosis not present

## 2023-04-15 DIAGNOSIS — L578 Other skin changes due to chronic exposure to nonionizing radiation: Secondary | ICD-10-CM | POA: Diagnosis not present

## 2023-04-15 DIAGNOSIS — L821 Other seborrheic keratosis: Secondary | ICD-10-CM | POA: Diagnosis not present

## 2023-04-15 DIAGNOSIS — C44329 Squamous cell carcinoma of skin of other parts of face: Secondary | ICD-10-CM | POA: Diagnosis not present

## 2023-04-20 DIAGNOSIS — J449 Chronic obstructive pulmonary disease, unspecified: Secondary | ICD-10-CM | POA: Diagnosis not present

## 2023-06-01 DIAGNOSIS — E538 Deficiency of other specified B group vitamins: Secondary | ICD-10-CM | POA: Diagnosis not present

## 2023-06-01 DIAGNOSIS — E782 Mixed hyperlipidemia: Secondary | ICD-10-CM | POA: Diagnosis not present

## 2023-06-01 DIAGNOSIS — Z125 Encounter for screening for malignant neoplasm of prostate: Secondary | ICD-10-CM | POA: Diagnosis not present

## 2023-06-08 DIAGNOSIS — E538 Deficiency of other specified B group vitamins: Secondary | ICD-10-CM | POA: Diagnosis not present

## 2023-06-08 DIAGNOSIS — I739 Peripheral vascular disease, unspecified: Secondary | ICD-10-CM | POA: Diagnosis not present

## 2023-06-08 DIAGNOSIS — E782 Mixed hyperlipidemia: Secondary | ICD-10-CM | POA: Diagnosis not present

## 2023-06-08 DIAGNOSIS — L570 Actinic keratosis: Secondary | ICD-10-CM | POA: Diagnosis not present

## 2023-06-08 DIAGNOSIS — J431 Panlobular emphysema: Secondary | ICD-10-CM | POA: Diagnosis not present

## 2023-06-08 DIAGNOSIS — I6523 Occlusion and stenosis of bilateral carotid arteries: Secondary | ICD-10-CM | POA: Diagnosis not present

## 2023-06-08 DIAGNOSIS — Z0000 Encounter for general adult medical examination without abnormal findings: Secondary | ICD-10-CM | POA: Diagnosis not present

## 2023-06-08 DIAGNOSIS — I471 Supraventricular tachycardia, unspecified: Secondary | ICD-10-CM | POA: Diagnosis not present

## 2023-07-07 DIAGNOSIS — C44329 Squamous cell carcinoma of skin of other parts of face: Secondary | ICD-10-CM | POA: Diagnosis not present

## 2023-08-17 DIAGNOSIS — C4441 Basal cell carcinoma of skin of scalp and neck: Secondary | ICD-10-CM | POA: Diagnosis not present

## 2023-08-17 DIAGNOSIS — D229 Melanocytic nevi, unspecified: Secondary | ICD-10-CM | POA: Diagnosis not present

## 2023-08-17 DIAGNOSIS — C44319 Basal cell carcinoma of skin of other parts of face: Secondary | ICD-10-CM | POA: Diagnosis not present

## 2023-08-17 DIAGNOSIS — L821 Other seborrheic keratosis: Secondary | ICD-10-CM | POA: Diagnosis not present

## 2023-08-17 DIAGNOSIS — D485 Neoplasm of uncertain behavior of skin: Secondary | ICD-10-CM | POA: Diagnosis not present

## 2023-08-17 DIAGNOSIS — L570 Actinic keratosis: Secondary | ICD-10-CM | POA: Diagnosis not present

## 2023-08-17 DIAGNOSIS — C44212 Basal cell carcinoma of skin of right ear and external auricular canal: Secondary | ICD-10-CM | POA: Diagnosis not present

## 2023-08-17 DIAGNOSIS — C44219 Basal cell carcinoma of skin of left ear and external auricular canal: Secondary | ICD-10-CM | POA: Diagnosis not present

## 2023-08-17 DIAGNOSIS — L578 Other skin changes due to chronic exposure to nonionizing radiation: Secondary | ICD-10-CM | POA: Diagnosis not present

## 2023-08-17 DIAGNOSIS — L814 Other melanin hyperpigmentation: Secondary | ICD-10-CM | POA: Diagnosis not present

## 2023-10-30 DIAGNOSIS — C44219 Basal cell carcinoma of skin of left ear and external auricular canal: Secondary | ICD-10-CM | POA: Diagnosis not present

## 2023-10-30 DIAGNOSIS — C44319 Basal cell carcinoma of skin of other parts of face: Secondary | ICD-10-CM | POA: Diagnosis not present

## 2023-12-02 DIAGNOSIS — E782 Mixed hyperlipidemia: Secondary | ICD-10-CM | POA: Diagnosis not present

## 2023-12-02 DIAGNOSIS — E538 Deficiency of other specified B group vitamins: Secondary | ICD-10-CM | POA: Diagnosis not present

## 2023-12-09 DIAGNOSIS — Z125 Encounter for screening for malignant neoplasm of prostate: Secondary | ICD-10-CM | POA: Diagnosis not present

## 2023-12-09 DIAGNOSIS — E538 Deficiency of other specified B group vitamins: Secondary | ICD-10-CM | POA: Diagnosis not present

## 2023-12-09 DIAGNOSIS — I471 Supraventricular tachycardia, unspecified: Secondary | ICD-10-CM | POA: Diagnosis not present

## 2023-12-09 DIAGNOSIS — Z23 Encounter for immunization: Secondary | ICD-10-CM | POA: Diagnosis not present

## 2023-12-09 DIAGNOSIS — R739 Hyperglycemia, unspecified: Secondary | ICD-10-CM | POA: Diagnosis not present

## 2023-12-09 DIAGNOSIS — J431 Panlobular emphysema: Secondary | ICD-10-CM | POA: Diagnosis not present

## 2024-06-01 DIAGNOSIS — J431 Panlobular emphysema: Secondary | ICD-10-CM | POA: Diagnosis not present

## 2024-06-01 DIAGNOSIS — Z125 Encounter for screening for malignant neoplasm of prostate: Secondary | ICD-10-CM | POA: Diagnosis not present

## 2024-06-01 DIAGNOSIS — E538 Deficiency of other specified B group vitamins: Secondary | ICD-10-CM | POA: Diagnosis not present

## 2024-06-01 DIAGNOSIS — R739 Hyperglycemia, unspecified: Secondary | ICD-10-CM | POA: Diagnosis not present

## 2024-06-08 DIAGNOSIS — C3492 Malignant neoplasm of unspecified part of left bronchus or lung: Secondary | ICD-10-CM | POA: Diagnosis not present

## 2024-06-08 DIAGNOSIS — J431 Panlobular emphysema: Secondary | ICD-10-CM | POA: Diagnosis not present

## 2024-06-08 DIAGNOSIS — E538 Deficiency of other specified B group vitamins: Secondary | ICD-10-CM | POA: Diagnosis not present

## 2024-06-08 DIAGNOSIS — E782 Mixed hyperlipidemia: Secondary | ICD-10-CM | POA: Diagnosis not present

## 2024-06-08 DIAGNOSIS — R972 Elevated prostate specific antigen [PSA]: Secondary | ICD-10-CM | POA: Diagnosis not present

## 2024-06-08 DIAGNOSIS — I739 Peripheral vascular disease, unspecified: Secondary | ICD-10-CM | POA: Diagnosis not present

## 2024-06-08 DIAGNOSIS — Z0000 Encounter for general adult medical examination without abnormal findings: Secondary | ICD-10-CM | POA: Diagnosis not present

## 2024-06-08 DIAGNOSIS — I471 Supraventricular tachycardia, unspecified: Secondary | ICD-10-CM | POA: Diagnosis not present

## 2024-10-25 DIAGNOSIS — Z23 Encounter for immunization: Secondary | ICD-10-CM | POA: Diagnosis not present
